# Patient Record
Sex: Male | Born: 1940 | Race: White | Hispanic: No | Smoking: Never smoker
Health system: Southern US, Community
[De-identification: ages and names within clinical notes are randomized; demographics above are authoritative.]

## PROBLEM LIST (undated history)

## (undated) DIAGNOSIS — N200 Calculus of kidney: Secondary | ICD-10-CM

## (undated) DIAGNOSIS — C4490 Unspecified malignant neoplasm of skin, unspecified: Secondary | ICD-10-CM

## (undated) DIAGNOSIS — G4730 Sleep apnea, unspecified: Secondary | ICD-10-CM

## (undated) DIAGNOSIS — I10 Essential (primary) hypertension: Secondary | ICD-10-CM

## (undated) DIAGNOSIS — Z7901 Long term (current) use of anticoagulants: Secondary | ICD-10-CM

## (undated) DIAGNOSIS — N529 Male erectile dysfunction, unspecified: Secondary | ICD-10-CM

## (undated) DIAGNOSIS — I272 Pulmonary hypertension, unspecified: Secondary | ICD-10-CM

## (undated) DIAGNOSIS — C801 Malignant (primary) neoplasm, unspecified: Secondary | ICD-10-CM

## (undated) DIAGNOSIS — D509 Iron deficiency anemia, unspecified: Secondary | ICD-10-CM

## (undated) DIAGNOSIS — N184 Chronic kidney disease, stage 4 (severe): Secondary | ICD-10-CM

## (undated) DIAGNOSIS — M4810 Ankylosing hyperostosis [Forestier], site unspecified: Secondary | ICD-10-CM

## (undated) DIAGNOSIS — E785 Hyperlipidemia, unspecified: Secondary | ICD-10-CM

## (undated) DIAGNOSIS — M961 Postlaminectomy syndrome, not elsewhere classified: Secondary | ICD-10-CM

## (undated) DIAGNOSIS — K219 Gastro-esophageal reflux disease without esophagitis: Secondary | ICD-10-CM

## (undated) DIAGNOSIS — M48062 Spinal stenosis, lumbar region with neurogenic claudication: Secondary | ICD-10-CM

## (undated) DIAGNOSIS — I2510 Atherosclerotic heart disease of native coronary artery without angina pectoris: Secondary | ICD-10-CM

## (undated) DIAGNOSIS — R2681 Unsteadiness on feet: Secondary | ICD-10-CM

## (undated) DIAGNOSIS — E119 Type 2 diabetes mellitus without complications: Secondary | ICD-10-CM

## (undated) DIAGNOSIS — M1990 Unspecified osteoarthritis, unspecified site: Secondary | ICD-10-CM

## (undated) DIAGNOSIS — G4733 Obstructive sleep apnea (adult) (pediatric): Secondary | ICD-10-CM

## (undated) DIAGNOSIS — G629 Polyneuropathy, unspecified: Secondary | ICD-10-CM

## (undated) DIAGNOSIS — I219 Acute myocardial infarction, unspecified: Secondary | ICD-10-CM

## (undated) DIAGNOSIS — I4891 Unspecified atrial fibrillation: Secondary | ICD-10-CM

## (undated) DIAGNOSIS — N400 Enlarged prostate without lower urinary tract symptoms: Secondary | ICD-10-CM

## (undated) DIAGNOSIS — M4800 Spinal stenosis, site unspecified: Secondary | ICD-10-CM

## (undated) HISTORY — PX: BACK SURGERY: SHX140

## (undated) HISTORY — PX: CORONARY ANGIOPLASTY WITH STENT PLACEMENT: SHX49

---

## 1983-03-12 DIAGNOSIS — I219 Acute myocardial infarction, unspecified: Secondary | ICD-10-CM

## 1983-03-12 DIAGNOSIS — I82401 Acute embolism and thrombosis of unspecified deep veins of right lower extremity: Secondary | ICD-10-CM

## 1983-03-12 HISTORY — DX: Acute embolism and thrombosis of unspecified deep veins of right lower extremity: I82.401

## 1983-03-12 HISTORY — DX: Acute myocardial infarction, unspecified: I21.9

## 1983-12-31 HISTORY — PX: CARDIAC CATHETERIZATION: SHX172

## 1987-03-12 HISTORY — PX: ANKLE SURGERY: SHX546

## 1988-05-08 HISTORY — PX: CARDIAC CATHETERIZATION: SHX172

## 2000-09-14 ENCOUNTER — Ambulatory Visit (HOSPITAL_COMMUNITY): Admission: RE | Admit: 2000-09-14 | Discharge: 2000-09-14 | Payer: Self-pay | Admitting: Neurosurgery

## 2000-09-14 ENCOUNTER — Encounter: Payer: Self-pay | Admitting: Neurosurgery

## 2000-09-29 ENCOUNTER — Encounter: Payer: Self-pay | Admitting: Neurosurgery

## 2000-09-29 ENCOUNTER — Encounter: Admission: RE | Admit: 2000-09-29 | Discharge: 2000-09-29 | Payer: Self-pay | Admitting: Neurosurgery

## 2000-10-07 ENCOUNTER — Encounter: Payer: Self-pay | Admitting: Neurosurgery

## 2000-10-08 ENCOUNTER — Inpatient Hospital Stay (HOSPITAL_COMMUNITY): Admission: RE | Admit: 2000-10-08 | Discharge: 2000-10-12 | Payer: Self-pay | Admitting: Neurosurgery

## 2000-10-08 ENCOUNTER — Encounter: Payer: Self-pay | Admitting: Neurosurgery

## 2001-03-11 HISTORY — PX: CORONARY ANGIOPLASTY WITH STENT PLACEMENT: SHX49

## 2002-02-17 HISTORY — PX: CORONARY ANGIOPLASTY WITH STENT PLACEMENT: SHX49

## 2002-08-17 HISTORY — PX: CARDIAC CATHETERIZATION: SHX172

## 2003-09-02 HISTORY — PX: CARDIAC CATHETERIZATION: SHX172

## 2004-04-18 ENCOUNTER — Ambulatory Visit: Payer: Self-pay | Admitting: Orthopedic Surgery

## 2004-04-25 ENCOUNTER — Ambulatory Visit: Payer: Self-pay | Admitting: Orthopedic Surgery

## 2004-06-25 ENCOUNTER — Ambulatory Visit: Payer: Self-pay | Admitting: Orthopedic Surgery

## 2013-08-16 ENCOUNTER — Encounter: Payer: Self-pay | Admitting: Neurology

## 2013-09-08 ENCOUNTER — Encounter: Payer: Self-pay | Admitting: Neurology

## 2013-10-09 ENCOUNTER — Encounter: Payer: Self-pay | Admitting: Neurology

## 2013-11-09 ENCOUNTER — Encounter: Payer: Self-pay | Admitting: Neurology

## 2013-11-23 HISTORY — PX: CARDIAC CATHETERIZATION: SHX172

## 2013-11-29 DIAGNOSIS — Z951 Presence of aortocoronary bypass graft: Secondary | ICD-10-CM

## 2013-11-29 HISTORY — DX: Presence of aortocoronary bypass graft: Z95.1

## 2013-11-29 HISTORY — PX: OTHER SURGICAL HISTORY: SHX169

## 2013-12-09 ENCOUNTER — Encounter: Payer: Self-pay | Admitting: Neurology

## 2017-11-05 ENCOUNTER — Emergency Department: Payer: Medicare HMO

## 2017-11-05 ENCOUNTER — Emergency Department
Admission: EM | Admit: 2017-11-05 | Discharge: 2017-11-06 | Disposition: A | Discharge status: Home / Self Care | Payer: Medicare HMO | Attending: Emergency Medicine | Admitting: Emergency Medicine

## 2017-11-05 ENCOUNTER — Other Ambulatory Visit: Payer: Self-pay

## 2017-11-05 DIAGNOSIS — Y939 Activity, unspecified: Secondary | ICD-10-CM | POA: Diagnosis not present

## 2017-11-05 DIAGNOSIS — Y9289 Other specified places as the place of occurrence of the external cause: Secondary | ICD-10-CM | POA: Diagnosis not present

## 2017-11-05 DIAGNOSIS — Y999 Unspecified external cause status: Secondary | ICD-10-CM | POA: Insufficient documentation

## 2017-11-05 DIAGNOSIS — E119 Type 2 diabetes mellitus without complications: Secondary | ICD-10-CM | POA: Diagnosis not present

## 2017-11-05 DIAGNOSIS — I10 Essential (primary) hypertension: Secondary | ICD-10-CM | POA: Insufficient documentation

## 2017-11-05 DIAGNOSIS — M546 Pain in thoracic spine: Secondary | ICD-10-CM | POA: Insufficient documentation

## 2017-11-05 DIAGNOSIS — I252 Old myocardial infarction: Secondary | ICD-10-CM | POA: Diagnosis not present

## 2017-11-05 DIAGNOSIS — Z7901 Long term (current) use of anticoagulants: Secondary | ICD-10-CM | POA: Diagnosis not present

## 2017-11-05 DIAGNOSIS — W19XXXA Unspecified fall, initial encounter: Secondary | ICD-10-CM

## 2017-11-05 DIAGNOSIS — R0789 Other chest pain: Secondary | ICD-10-CM | POA: Diagnosis not present

## 2017-11-05 DIAGNOSIS — S0990XA Unspecified injury of head, initial encounter: Secondary | ICD-10-CM | POA: Insufficient documentation

## 2017-11-05 DIAGNOSIS — W1839XA Other fall on same level, initial encounter: Secondary | ICD-10-CM | POA: Insufficient documentation

## 2017-11-05 HISTORY — DX: Type 2 diabetes mellitus without complications: E11.9

## 2017-11-05 HISTORY — DX: Unspecified atrial fibrillation: I48.91

## 2017-11-05 HISTORY — DX: Essential (primary) hypertension: I10

## 2017-11-05 HISTORY — DX: Unspecified osteoarthritis, unspecified site: M19.90

## 2017-11-05 HISTORY — DX: Gastro-esophageal reflux disease without esophagitis: K21.9

## 2017-11-05 HISTORY — DX: Polyneuropathy, unspecified: G62.9

## 2017-11-05 HISTORY — DX: Acute myocardial infarction, unspecified: I21.9

## 2017-11-05 LAB — BASIC METABOLIC PANEL
ANION GAP: 8 (ref 5–15)
BUN: 22 mg/dL (ref 8–23)
CALCIUM: 9.2 mg/dL (ref 8.9–10.3)
CO2: 27 mmol/L (ref 22–32)
Chloride: 104 mmol/L (ref 98–111)
Creatinine, Ser: 1.29 mg/dL — ABNORMAL HIGH (ref 0.61–1.24)
GFR, EST NON AFRICAN AMERICAN: 53 mL/min — AB (ref >60)
GLUCOSE: 128 mg/dL — AB (ref 70–99)
POTASSIUM: 3.8 mmol/L (ref 3.5–5.1)
Sodium: 139 mmol/L (ref 135–145)

## 2017-11-05 LAB — CBC WITH DIFFERENTIAL/PLATELET
BASOS ABS: 0 10*3/uL (ref 0–0.1)
BASOS PCT: 0 %
EOS PCT: 0 %
Eosinophils Absolute: 0 10*3/uL (ref 0–0.7)
HCT: 34.5 % — ABNORMAL LOW (ref 40.0–52.0)
Hemoglobin: 11.8 g/dL — ABNORMAL LOW (ref 13.0–18.0)
Lymphocytes Relative: 12 %
Lymphs Abs: 1.3 10*3/uL (ref 1.0–3.6)
MCH: 27.6 pg (ref 26.0–34.0)
MCHC: 34.3 g/dL (ref 32.0–36.0)
MCV: 80.5 fL (ref 80.0–100.0)
Monocytes Absolute: 0.9 10*3/uL (ref 0.2–1.0)
Monocytes Relative: 8 %
NEUTROS ABS: 8.6 10*3/uL — AB (ref 1.4–6.5)
Neutrophils Relative %: 80 %
PLATELETS: 214 10*3/uL (ref 150–440)
RBC: 4.29 MIL/uL — AB (ref 4.40–5.90)
RDW: 24.7 % — AB (ref 11.5–14.5)
WBC: 10.9 10*3/uL — AB (ref 3.8–10.6)

## 2017-11-05 LAB — LACTIC ACID, PLASMA: LACTIC ACID, VENOUS: 1.6 mmol/L (ref 0.5–1.9)

## 2017-11-05 MED ORDER — AZITHROMYCIN 500 MG PO TABS: 500.0000 mg | ORAL_TABLET | Freq: Once | ORAL | Status: DC

## 2017-11-05 MED ORDER — AZITHROMYCIN 250 MG PO TABS: ORAL_TABLET | ORAL | 0 refills | Status: AC

## 2017-11-05 MED ORDER — AMOXICILLIN-POT CLAVULANATE 875-125 MG PO TABS: 1.0000 | ORAL_TABLET | Freq: Two times a day (BID) | ORAL | 0 refills | Status: AC

## 2017-11-05 MED ORDER — AZITHROMYCIN 500 MG PO TABS
500.0000 mg | ORAL_TABLET | Freq: Once | ORAL | Status: AC
  Administered 2017-11-05: 500 mg via ORAL
  Filled 2017-11-05: qty 1

## 2017-11-05 MED ORDER — AMOXICILLIN-POT CLAVULANATE 875-125 MG PO TABS
1.0000 | ORAL_TABLET | Freq: Once | ORAL | Status: AC
  Administered 2017-11-05: 1 via ORAL
  Filled 2017-11-05: qty 1

## 2017-11-05 MED ORDER — SODIUM CHLORIDE 0.9 % IV SOLN: 1.0000 g | Freq: Once | INTRAVENOUS | Status: DC

## 2017-11-05 NOTE — Discharge Instructions (Signed)
The chest x-ray shows what appears to be a pneumonia in the left chest.  You are not coughing not running a high fever but just in case I will give you some antibiotics.  I will give you some Augmentin and some Zithromax.  Both of these can be taken together.  The Augmentin is 1 twice a day with food to try to avoid diarrhea and the Zithromax is once a day.  I will give you the first dose of each tonight.  Please return for any fever shortness of breath bad coughing pain or feeling sicker.  Please follow-up with your doctor in about a week or 2.

## 2017-11-05 NOTE — ED Triage Notes (Signed)
Pt stated that he fell while he was outside in his work shop and stated that he hit his head but did not pass out. Pt c/o upper back pain. Pt has rods in his back from a previous injury.

## 2017-11-05 NOTE — ED Provider Notes (Signed)
[facility] Emergency Department Provider Note  ____________________________________________   I have reviewed the triage vital signs and the nursing notes.   HISTORY  Chief Complaint Fall   History limited by: Not Limited   HPI Derek Andersen is a 77 y.o. male who presents to the emergency department today because of concern for fall and head injury. Patient states he was in his workshop when he lost his balance and fell backwards. Hit his head but denies LOC. Did fall on her left side. Complaining of pain in his upper back. Has history of frequent back surgeries. Denies any chest pain, palpitations. Denies any nausea or vomiting. Is on eliquis.    Per medical record review patient has a history of atrial fibrillation.  Past Medical History:  Diagnosis Date  . Arthritis   . Atrial fibrillation ([place])   . Diabetes mellitus without complication ([place])   . GERD (gastroesophageal reflux disease)   . Hypertension   . MI (myocardial infarction) ([place])   . Neuropathy     There are no active problems to display for this patient.    Prior to Admission medications   Not on File    Allergies Latex and Oxycodone  No family history on file.  Social History Social History   Tobacco Use  . Smoking status: Not on file  Substance Use Topics  . Alcohol use: Not on file  . Drug use: Not on file    Review of Systems Constitutional: No fever/chills Eyes: No visual changes. ENT: No sore throat. Cardiovascular: Denies chest pain. Respiratory: Denies shortness of breath. Gastrointestinal: No abdominal pain.  No nausea, no vomiting.  No diarrhea.   Genitourinary: Negative for dysuria. Musculoskeletal: Positive for upper back pain. Skin: Negative for rash. Neurological: Negative for headaches, focal weakness or numbness.  ____________________________________________   PHYSICAL EXAM:  VITAL SIGNS: ED Triage Vitals  Enc Vitals Group     BP  11/05/17 1906 (!) 174/70     Pulse Rate 11/05/17 1906 65     Resp 11/05/17 1906 18     Temp 11/05/17 1906 100.1 F (37.8 C)     Temp src --      SpO2 11/05/17 1906 95 %     Weight 11/05/17 1920 232 lb (105.2 kg)     Height 11/05/17 1920 $RemoveBefor'5\' 6"'[id number]$  (1.676 m)     Head Circumference --      Peak Flow --      Pain Score 11/05/17 1907 4   Constitutional: Alert and oriented.  Eyes: Conjunctivae are normal.  ENT      Head: Normocephalic and atraumatic.      Nose: No congestion/rhinnorhea.      Mouth/Throat: Mucous membranes are moist.      Neck: No stridor. No midline tenderness. Hematological/Lymphatic/Immunilogical: No cervical lymphadenopathy. Cardiovascular: Normal rate, regular rhythm.  No murmurs, rubs, or gallops.  Respiratory: Normal respiratory effort without tachypnea nor retractions. Breath sounds are clear and equal bilaterally. No wheezes/rales/rhonchi. Gastrointestinal: Soft and non tender. No rebound. No guarding.  Genitourinary: Deferred Musculoskeletal: Normal range of motion in all extremities. No lower extremity edema. Neurologic:  Normal speech and language. No gross focal neurologic deficits are appreciated.  Skin:  Small abrasion to left mid back. Psychiatric: Mood and affect are normal. Speech and behavior are normal. Patient exhibits appropriate insight and judgment.  ____________________________________________    LABS (pertinent positives/negatives)  Pending  ____________________________________________   EKG  I, Nance Pear, attending physician, personally viewed and  interpreted this EKG  EKG Time: 1919 Rate: 63 Rhythm: sinus rhythm Axis: left axis deviation Intervals: qtc 421 QRS: q waves v1, v2 ST changes: no st elevation Impression: abnormal ekg   ____________________________________________    RADIOLOGY  CT head/cervical spine No acute intracranial or cervical spine findings  Thoracic spine x-ray No acute injury  Left rib  fracture No rib fracture, concern for possible consolidation in left lower lobe. ____________________________________________   PROCEDURES  Procedures  ____________________________________________   INITIAL IMPRESSION / ASSESSMENT AND PLAN / ED COURSE  Pertinent labs & imaging results that were available during my care of the patient were reviewed by me and considered in my medical decision making (see chart for details).   Patient presented to the emergency department today after a fall and hitting his head.  Patient did have a small abrasion over the left  mid back.  Had imaging done of the head cervical spine thoracic spine and left rib series.  None of this showed an acute traumatic injury however there was concern for possible pneumonia to the left lower lobe.  Given this finding will add on blood work.  Blood work pending at time of signout.   ____________________________________________   FINAL CLINICAL IMPRESSION(S) / ED DIAGNOSES  Fall  Note: This dictation was prepared with Sales executive. Any transcriptional errors that result from this process are unintentional     Nance Pear, MD 11/05/17 2104

## 2017-11-05 NOTE — ED Notes (Signed)
Provider in with patient.

## 2017-11-05 NOTE — ED Provider Notes (Signed)
Patient has a low-grade fever and a little bit of a white count.  There is a dense infiltrate at the left base.  Uncertain if this is worse than what he has had documented previously.  He also has a fusion of course.  Because of the low-grade fever I will give him some antibiotics.  He will follow-up with his doctor and return if worse.   Nena Polio, MD 11/05/17 [phone]

## 2018-11-12 ENCOUNTER — Other Ambulatory Visit: Payer: Self-pay

## 2018-11-12 DIAGNOSIS — Z20822 Contact with and (suspected) exposure to covid-19: Secondary | ICD-10-CM

## 2018-11-13 LAB — NOVEL CORONAVIRUS, NAA: SARS-CoV-2, NAA: DETECTED — AB

## 2018-11-20 DIAGNOSIS — Z8616 Personal history of COVID-19: Secondary | ICD-10-CM

## 2018-11-20 HISTORY — DX: Personal history of COVID-19: Z86.16

## 2019-06-02 ENCOUNTER — Other Ambulatory Visit: Payer: Self-pay | Admitting: Nephrology

## 2019-06-02 DIAGNOSIS — R809 Proteinuria, unspecified: Secondary | ICD-10-CM

## 2019-06-02 DIAGNOSIS — N184 Chronic kidney disease, stage 4 (severe): Secondary | ICD-10-CM

## 2019-06-07 ENCOUNTER — Ambulatory Visit
Admission: RE | Admit: 2019-06-07 | Discharge: 2019-06-07 | Disposition: A | Discharge status: Home / Self Care | Payer: Medicare HMO | Source: Ambulatory Visit | Attending: Nephrology | Admitting: Nephrology

## 2019-06-07 ENCOUNTER — Other Ambulatory Visit: Payer: Self-pay

## 2019-06-07 DIAGNOSIS — R809 Proteinuria, unspecified: Secondary | ICD-10-CM | POA: Diagnosis present

## 2019-06-07 DIAGNOSIS — N184 Chronic kidney disease, stage 4 (severe): Secondary | ICD-10-CM | POA: Diagnosis not present

## 2019-06-28 ENCOUNTER — Encounter: Payer: Self-pay | Admitting: *Deleted

## 2019-06-28 ENCOUNTER — Other Ambulatory Visit: Payer: Self-pay

## 2019-06-28 ENCOUNTER — Inpatient Hospital Stay
Admission: EM | Admit: 2019-06-28 | Discharge: 2019-07-07 | DRG: 371 | Disposition: A | Discharge status: Home Health | Payer: Medicare HMO | Attending: Hospitalist | Admitting: Hospitalist

## 2019-06-28 DIAGNOSIS — Z981 Arthrodesis status: Secondary | ICD-10-CM

## 2019-06-28 DIAGNOSIS — R509 Fever, unspecified: Secondary | ICD-10-CM

## 2019-06-28 DIAGNOSIS — A09 Infectious gastroenteritis and colitis, unspecified: Secondary | ICD-10-CM | POA: Diagnosis present

## 2019-06-28 DIAGNOSIS — E1169 Type 2 diabetes mellitus with other specified complication: Secondary | ICD-10-CM

## 2019-06-28 DIAGNOSIS — Z833 Family history of diabetes mellitus: Secondary | ICD-10-CM

## 2019-06-28 DIAGNOSIS — I252 Old myocardial infarction: Secondary | ICD-10-CM

## 2019-06-28 DIAGNOSIS — A020 Salmonella enteritis: Principal | ICD-10-CM

## 2019-06-28 DIAGNOSIS — R195 Other fecal abnormalities: Secondary | ICD-10-CM | POA: Diagnosis present

## 2019-06-28 DIAGNOSIS — Z7982 Long term (current) use of aspirin: Secondary | ICD-10-CM

## 2019-06-28 DIAGNOSIS — Z823 Family history of stroke: Secondary | ICD-10-CM

## 2019-06-28 DIAGNOSIS — E860 Dehydration: Secondary | ICD-10-CM | POA: Diagnosis present

## 2019-06-28 DIAGNOSIS — K922 Gastrointestinal hemorrhage, unspecified: Secondary | ICD-10-CM

## 2019-06-28 DIAGNOSIS — Z79899 Other long term (current) drug therapy: Secondary | ICD-10-CM

## 2019-06-28 DIAGNOSIS — D649 Anemia, unspecified: Secondary | ICD-10-CM

## 2019-06-28 DIAGNOSIS — D696 Thrombocytopenia, unspecified: Secondary | ICD-10-CM

## 2019-06-28 DIAGNOSIS — Z8249 Family history of ischemic heart disease and other diseases of the circulatory system: Secondary | ICD-10-CM

## 2019-06-28 DIAGNOSIS — I2510 Atherosclerotic heart disease of native coronary artery without angina pectoris: Secondary | ICD-10-CM

## 2019-06-28 DIAGNOSIS — K219 Gastro-esophageal reflux disease without esophagitis: Secondary | ICD-10-CM | POA: Diagnosis present

## 2019-06-28 DIAGNOSIS — Z8616 Personal history of COVID-19: Secondary | ICD-10-CM

## 2019-06-28 DIAGNOSIS — J9601 Acute respiratory failure with hypoxia: Secondary | ICD-10-CM

## 2019-06-28 DIAGNOSIS — Z951 Presence of aortocoronary bypass graft: Secondary | ICD-10-CM

## 2019-06-28 DIAGNOSIS — R0902 Hypoxemia: Secondary | ICD-10-CM

## 2019-06-28 DIAGNOSIS — Z800 Family history of malignant neoplasm of digestive organs: Secondary | ICD-10-CM

## 2019-06-28 DIAGNOSIS — Z7901 Long term (current) use of anticoagulants: Secondary | ICD-10-CM

## 2019-06-28 DIAGNOSIS — A0472 Enterocolitis due to Clostridium difficile, not specified as recurrent: Secondary | ICD-10-CM

## 2019-06-28 DIAGNOSIS — E1122 Type 2 diabetes mellitus with diabetic chronic kidney disease: Secondary | ICD-10-CM | POA: Diagnosis present

## 2019-06-28 DIAGNOSIS — N184 Chronic kidney disease, stage 4 (severe): Secondary | ICD-10-CM

## 2019-06-28 DIAGNOSIS — I482 Chronic atrial fibrillation, unspecified: Secondary | ICD-10-CM

## 2019-06-28 DIAGNOSIS — K529 Noninfective gastroenteritis and colitis, unspecified: Secondary | ICD-10-CM

## 2019-06-28 DIAGNOSIS — N179 Acute kidney failure, unspecified: Secondary | ICD-10-CM

## 2019-06-28 DIAGNOSIS — E785 Hyperlipidemia, unspecified: Secondary | ICD-10-CM | POA: Diagnosis present

## 2019-06-28 DIAGNOSIS — N400 Enlarged prostate without lower urinary tract symptoms: Secondary | ICD-10-CM | POA: Diagnosis present

## 2019-06-28 DIAGNOSIS — G4733 Obstructive sleep apnea (adult) (pediatric): Secondary | ICD-10-CM | POA: Diagnosis present

## 2019-06-28 DIAGNOSIS — I129 Hypertensive chronic kidney disease with stage 1 through stage 4 chronic kidney disease, or unspecified chronic kidney disease: Secondary | ICD-10-CM | POA: Diagnosis present

## 2019-06-28 DIAGNOSIS — D631 Anemia in chronic kidney disease: Secondary | ICD-10-CM | POA: Diagnosis present

## 2019-06-28 DIAGNOSIS — Z841 Family history of disorders of kidney and ureter: Secondary | ICD-10-CM

## 2019-06-28 DIAGNOSIS — Z794 Long term (current) use of insulin: Secondary | ICD-10-CM

## 2019-06-28 DIAGNOSIS — D62 Acute posthemorrhagic anemia: Secondary | ICD-10-CM

## 2019-06-28 LAB — URINALYSIS, COMPLETE (UACMP) WITH MICROSCOPIC
Bacteria, UA: NONE SEEN
Bilirubin Urine: NEGATIVE
Glucose, UA: 50 mg/dL — AB
Ketones, ur: NEGATIVE mg/dL
Nitrite: NEGATIVE
Protein, ur: 100 mg/dL — AB
Specific Gravity, Urine: 1.017 (ref 1.005–1.030)
pH: 5 (ref 5.0–8.0)

## 2019-06-28 LAB — CBC
HCT: 19.8 % — ABNORMAL LOW (ref 39.0–52.0)
Hemoglobin: 6.1 g/dL — ABNORMAL LOW (ref 13.0–17.0)
MCH: 30.5 pg (ref 26.0–34.0)
MCHC: 30.8 g/dL (ref 30.0–36.0)
MCV: 99 fL (ref 80.0–100.0)
Platelets: 129 10*3/uL — ABNORMAL LOW (ref 150–400)
RBC: 2 MIL/uL — ABNORMAL LOW (ref 4.22–5.81)
RDW: 13.7 % (ref 11.5–15.5)
WBC: 7.9 10*3/uL (ref 4.0–10.5)
nRBC: 0 % (ref 0.0–0.2)

## 2019-06-28 LAB — LIPASE, BLOOD: Lipase: 24 U/L (ref 11–51)

## 2019-06-28 LAB — COMPREHENSIVE METABOLIC PANEL
ALT: 13 U/L (ref 0–44)
AST: 18 U/L (ref 15–41)
Albumin: 3.8 g/dL (ref 3.5–5.0)
Alkaline Phosphatase: 49 U/L (ref 38–126)
Anion gap: 9 (ref 5–15)
BUN: 50 mg/dL — ABNORMAL HIGH (ref 8–23)
CO2: 25 mmol/L (ref 22–32)
Calcium: 9.3 mg/dL (ref 8.9–10.3)
Chloride: 105 mmol/L (ref 98–111)
Creatinine, Ser: 3.19 mg/dL — ABNORMAL HIGH (ref 0.61–1.24)
GFR calc Af Amer: 21 mL/min — ABNORMAL LOW (ref >60)
GFR calc non Af Amer: 18 mL/min — ABNORMAL LOW (ref >60)
Glucose, Bld: 93 mg/dL (ref 70–99)
Potassium: 4.6 mmol/L (ref 3.5–5.1)
Sodium: 139 mmol/L (ref 135–145)
Total Bilirubin: 1 mg/dL (ref 0.3–1.2)
Total Protein: 7.7 g/dL (ref 6.5–8.1)

## 2019-06-28 LAB — GLUCOSE, CAPILLARY: Glucose-Capillary: 83 mg/dL (ref 70–99)

## 2019-06-28 MED ORDER — SODIUM CHLORIDE 0.9% FLUSH
3.0000 mL | Freq: Once | INTRAVENOUS | Status: AC
  Administered 2019-06-29: 3 mL via INTRAVENOUS

## 2019-06-28 MED ORDER — LACTATED RINGERS IV BOLUS
500.0000 mL | Freq: Once | INTRAVENOUS | Status: AC
  Administered 2019-06-29: 500 mL via INTRAVENOUS

## 2019-06-28 MED ORDER — ACETAMINOPHEN 500 MG PO TABS
1000.0000 mg | ORAL_TABLET | Freq: Once | ORAL | Status: AC
  Administered 2019-06-29: 1000 mg via ORAL
  Filled 2019-06-28: qty 2

## 2019-06-28 MED ORDER — SODIUM CHLORIDE 0.9 % IV SOLN: 10.0000 mL/h | Freq: Once | INTRAVENOUS | Status: DC

## 2019-06-28 NOTE — ED Provider Notes (Signed)
[facility] Emergency Department Provider Note  ____________________________________________  Time seen: Approximately 11:33 PM  I have reviewed the triage vital signs and the nursing notes.   HISTORY  Chief Complaint Diarrhea   HPI Derek Andersen is a 79 y.o. male with a history of A. fib on Eliquis, diabetes, hypertension, CAD, CKD, anemia of chronic disease who presents for evaluation of generalized weakness, vomiting, and diarrhea. Patient reports that he woke up this morning at 4AM feeling very weak and sick. Had 2 episodes of NBNB emesis and 4 episodes of watery yellow diarrhea during the day. No abdominal pain, no cough, CP, SOB, or abdominal pain. Patient reports that his PCP recently cut his Eliquis in half due to slowly worsening anemia. No prior history of GIB, no history of cirrhosis. Last Eliquis was this morning.    Past Medical History:  Diagnosis Date  . Arthritis   . Atrial fibrillation ([place])   . Diabetes mellitus without complication ([place])   . GERD (gastroesophageal reflux disease)   . Hypertension   . MI (myocardial infarction) ([place])   . Neuropathy     Allergies Latex and Oxycodone  FH Heart disease Daughter Marlowe Kays   Heart disease Mother      Social History Social History   Tobacco Use  . Smoking status: Never Smoker  . Smokeless tobacco: Never Used  Substance Use Topics  . Alcohol use: Not Currently  . Drug use: Not Currently    Review of Systems  Constitutional: Negative for fever. + generalized weakness Eyes: Negative for visual changes. ENT: Negative for sore throat. Neck: No neck pain  Cardiovascular: Negative for chest pain. Respiratory: Negative for shortness of breath. Gastrointestinal: Negative for abdominal pain. + vomiting and diarrhea. Genitourinary: Negative for dysuria. Musculoskeletal: Negative for back pain. Skin: Negative for rash. Neurological: Negative for headaches, weakness or  numbness. Psych: No SI or HI  ____________________________________________   PHYSICAL EXAM:  VITAL SIGNS: ED Triage Vitals  Enc Vitals Group     BP 06/28/19 1958 (!) 116/58     Pulse Rate 06/28/19 1958 78     Resp 06/28/19 1958 18     Temp 06/28/19 1958 99.2 F (37.3 C)     Temp Source 06/28/19 1958 Oral     SpO2 06/28/19 1958 96 %     Weight 06/28/19 1959 229 lb (103.9 kg)     Height 06/28/19 1959 $RemoveBefor'5\' 6"'[id number]$  (1.676 m)     Head Circumference --      Peak Flow --      Pain Score 06/28/19 1958 0     Pain Loc --      Pain Edu? --      Excl. in [place]? --     Constitutional: Alert and oriented, looks unwell but no obvious distress.  HEENT:      Head: Normocephalic and atraumatic.         Eyes: Conjunctivae are normal. Sclera is non-icteric.       Mouth/Throat: Mucous membranes are moist.       Neck: Supple with no signs of meningismus. Cardiovascular: Regular rate and rhythm. No murmurs, gallops, or rubs. 2+ symmetrical distal pulses are present in all extremities. No JVD. Respiratory: Mild increased work of breathing, tachypneic, initially satting normal on room air but now 88%, placed on 2 L, lungs are clear to auscultation.   Gastrointestinal: Soft, non tender, and non distended with positive bowel sounds. No rebound or guarding. Genitourinary: Yellow stool guaiac  positive Musculoskeletal:  No edema, cyanosis, or erythema of extremities. Neurologic: Normal speech and language. Face is symmetric. Moving all extremities. No gross focal neurologic deficits are appreciated. Skin: Skin is warm, dry and intact. No rash noted. Psychiatric: Mood and affect are normal. Speech and behavior are normal.  ____________________________________________   LABS (all labs ordered are listed, but only abnormal results are displayed)  Labs Reviewed  GASTROINTESTINAL PANEL BY PCR, STOOL (REPLACES STOOL CULTURE) - Abnormal; Notable for the following components:      Result Value   Salmonella  species DETECTED (*)    All other components within normal limits  C DIFFICILE QUICK SCREEN W PCR REFLEX - Abnormal; Notable for the following components:   C Diff antigen POSITIVE (*)    All other components within normal limits  CLOSTRIDIUM DIFFICILE BY PCR, REFLEXED - Abnormal; Notable for the following components:   Toxigenic C. Difficile by PCR POSITIVE (*)    All other components within normal limits  COMPREHENSIVE METABOLIC PANEL - Abnormal; Notable for the following components:   BUN 50 (*)    Creatinine, Ser 3.19 (*)    GFR calc non Af Amer 18 (*)    GFR calc Af Amer 21 (*)    All other components within normal limits  CBC - Abnormal; Notable for the following components:   RBC 2.00 (*)    Hemoglobin 6.1 (*)    HCT 19.8 (*)    Platelets 129 (*)    All other components within normal limits  URINALYSIS, COMPLETE (UACMP) WITH MICROSCOPIC - Abnormal; Notable for the following components:   Color, Urine YELLOW (*)    APPearance HAZY (*)    Glucose, UA 50 (*)    Hgb urine dipstick MODERATE (*)    Protein, ur 100 (*)    Leukocytes,Ua TRACE (*)    All other components within normal limits  LACTIC ACID, PLASMA - Abnormal; Notable for the following components:   Lactic Acid, Venous 0.4 (*)    All other components within normal limits  RESPIRATORY PANEL BY RT PCR (FLU A&B, COVID)  GLUCOSE, CAPILLARY  LIPASE, BLOOD  PROTIME-INR  APTT  HEMOGLOBIN A1C  PREPARE RBC (CROSSMATCH)  TYPE AND SCREEN  TYPE AND SCREEN  ABO/RH  TYPE AND SCREEN   ____________________________________________  EKG  none  ____________________________________________  RADIOLOGY  none  ____________________________________________   PROCEDURES  Procedure(s) performed:yes .1-3 Lead EKG Interpretation Performed by: Rudene Re, MD Authorized by: Rudene Re, MD     Interpretation: normal     ECG rate:  80   ECG rate assessment: normal     Rhythm: sinus rhythm     Ectopy:  none     Conduction: normal     Critical Care performed: yes  CRITICAL CARE Performed by: Rudene Re  ?  Total critical care time: 40 min  Critical care time was exclusive of separately billable procedures and treating other patients.  Critical care was necessary to treat or prevent imminent or life-threatening deterioration.  Critical care was time spent personally by me on the following activities: development of treatment plan with patient and/or surrogate as well as nursing, discussions with consultants, evaluation of patient's response to treatment, examination of patient, obtaining history from patient or surrogate, ordering and performing treatments and interventions, ordering and review of laboratory studies, ordering and review of radiographic studies, pulse oximetry and re-evaluation of patient's condition.  ____________________________________________   INITIAL IMPRESSION / ASSESSMENT AND PLAN / ED COURSE   79  y.o. male with a history of A. fib on Eliquis, diabetes, hypertension, CAD, CKD, anemia of chronic disease who presents for evaluation of generalized weakness, vomiting, and diarrhea.  Patient looks unwell, tachypneic and with new O2 requirement of 2L [place]. Hemodynamically stable with no tachycardia.  Low-grade temp of 100F.  Stool is yellow but guaiac positive.  Abdomen is soft and nontender.  Differential diagnoses including colitis versus gastroenteritis versus C. difficile versus Covid versus flu.  Labs showing acute on chronic anemia with hemoglobin of 6.1 (baseline of 11.8), also new mild thrombocytopenia with platelets of 129. Normal lactic and coags.  No leukocytosis.  Acute on chronic kidney injury with creatinine of 3.2 (baseline of 2.2).  Stool cultures, Covid, C. difficile, flu are pending.  CXR concerning for retrocardiac opacity which possibly could be pneumonia versus atelectasis.  C. difficile positive.  Stool culture positive for Salmonella.  Will  start patient on vancomycin and Rocephin. Will transfuse 2 units.  Patient has been consented and risks and benefits of transfusion have been discussed.  Old records have been reviewed.  Discussed patient with Dr. Damita Dunnings who has accepted him to her service.  Patient on telemetry for close monitoring of cardiovascular status.      _____________________________________________ Please note:  Patient was evaluated in Emergency Department today for the symptoms described in the history of present illness. Patient was evaluated in the context of the global COVID-19 pandemic, which necessitated consideration that the patient might be at risk for infection with the SARS-CoV-2 virus that causes COVID-19. Institutional protocols and algorithms that pertain to the evaluation of patients at risk for COVID-19 are in a state of rapid change based on information released by regulatory bodies including the CDC and federal and state organizations. These policies and algorithms were followed during the patient's care in the ED.  Some ED evaluations and interventions may be delayed as a result of limited staffing during the pandemic.   [place] Controlled Substance Database was reviewed by me. 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 difficile colitis  Salmonella gastroenteritis      NEW MEDICATIONS STARTED DURING THIS VISIT:  ED Discharge Orders    None       Note:  This document was prepared using Dragon voice recognition software and may include unintentional dictation errors.    Rudene Re, MD 06/29/19 [phone]

## 2019-06-28 NOTE — ED Triage Notes (Addendum)
Pt brought in via ems from home.  Pt reports diarrhea x 3 today.  States I feel like crap, feel weak.  Iv fluids infusing.   Pt alert.  Speech clear.  fsbs 83 in triage.

## 2019-06-29 ENCOUNTER — Encounter: Payer: Self-pay | Admitting: Internal Medicine

## 2019-06-29 ENCOUNTER — Observation Stay: Payer: Medicare HMO

## 2019-06-29 DIAGNOSIS — E1122 Type 2 diabetes mellitus with diabetic chronic kidney disease: Secondary | ICD-10-CM | POA: Diagnosis present

## 2019-06-29 DIAGNOSIS — Z981 Arthrodesis status: Secondary | ICD-10-CM

## 2019-06-29 DIAGNOSIS — D696 Thrombocytopenia, unspecified: Secondary | ICD-10-CM | POA: Diagnosis present

## 2019-06-29 DIAGNOSIS — Z8616 Personal history of COVID-19: Secondary | ICD-10-CM | POA: Diagnosis not present

## 2019-06-29 DIAGNOSIS — Z794 Long term (current) use of insulin: Secondary | ICD-10-CM | POA: Diagnosis not present

## 2019-06-29 DIAGNOSIS — J9601 Acute respiratory failure with hypoxia: Secondary | ICD-10-CM | POA: Diagnosis present

## 2019-06-29 DIAGNOSIS — R509 Fever, unspecified: Secondary | ICD-10-CM

## 2019-06-29 DIAGNOSIS — Z7901 Long term (current) use of anticoagulants: Secondary | ICD-10-CM

## 2019-06-29 DIAGNOSIS — N400 Enlarged prostate without lower urinary tract symptoms: Secondary | ICD-10-CM | POA: Diagnosis present

## 2019-06-29 DIAGNOSIS — I2510 Atherosclerotic heart disease of native coronary artery without angina pectoris: Secondary | ICD-10-CM | POA: Diagnosis present

## 2019-06-29 DIAGNOSIS — Z951 Presence of aortocoronary bypass graft: Secondary | ICD-10-CM

## 2019-06-29 DIAGNOSIS — N184 Chronic kidney disease, stage 4 (severe): Secondary | ICD-10-CM | POA: Diagnosis present

## 2019-06-29 DIAGNOSIS — A0472 Enterocolitis due to Clostridium difficile, not specified as recurrent: Secondary | ICD-10-CM | POA: Diagnosis present

## 2019-06-29 DIAGNOSIS — N179 Acute kidney failure, unspecified: Secondary | ICD-10-CM

## 2019-06-29 DIAGNOSIS — D649 Anemia, unspecified: Secondary | ICD-10-CM | POA: Diagnosis not present

## 2019-06-29 DIAGNOSIS — I482 Chronic atrial fibrillation, unspecified: Secondary | ICD-10-CM

## 2019-06-29 DIAGNOSIS — D631 Anemia in chronic kidney disease: Secondary | ICD-10-CM | POA: Diagnosis present

## 2019-06-29 DIAGNOSIS — I252 Old myocardial infarction: Secondary | ICD-10-CM | POA: Diagnosis not present

## 2019-06-29 DIAGNOSIS — A020 Salmonella enteritis: Secondary | ICD-10-CM

## 2019-06-29 DIAGNOSIS — K922 Gastrointestinal hemorrhage, unspecified: Secondary | ICD-10-CM

## 2019-06-29 DIAGNOSIS — N189 Chronic kidney disease, unspecified: Secondary | ICD-10-CM

## 2019-06-29 DIAGNOSIS — E1169 Type 2 diabetes mellitus with other specified complication: Secondary | ICD-10-CM

## 2019-06-29 DIAGNOSIS — K529 Noninfective gastroenteritis and colitis, unspecified: Secondary | ICD-10-CM

## 2019-06-29 DIAGNOSIS — Z79899 Other long term (current) drug therapy: Secondary | ICD-10-CM | POA: Diagnosis not present

## 2019-06-29 DIAGNOSIS — I129 Hypertensive chronic kidney disease with stage 1 through stage 4 chronic kidney disease, or unspecified chronic kidney disease: Secondary | ICD-10-CM | POA: Diagnosis present

## 2019-06-29 DIAGNOSIS — G4733 Obstructive sleep apnea (adult) (pediatric): Secondary | ICD-10-CM | POA: Diagnosis present

## 2019-06-29 DIAGNOSIS — I4891 Unspecified atrial fibrillation: Secondary | ICD-10-CM | POA: Diagnosis not present

## 2019-06-29 DIAGNOSIS — Z7982 Long term (current) use of aspirin: Secondary | ICD-10-CM | POA: Diagnosis not present

## 2019-06-29 DIAGNOSIS — A09 Infectious gastroenteritis and colitis, unspecified: Secondary | ICD-10-CM | POA: Diagnosis present

## 2019-06-29 DIAGNOSIS — R195 Other fecal abnormalities: Secondary | ICD-10-CM | POA: Diagnosis present

## 2019-06-29 DIAGNOSIS — E785 Hyperlipidemia, unspecified: Secondary | ICD-10-CM | POA: Diagnosis present

## 2019-06-29 LAB — GASTROINTESTINAL PANEL BY PCR, STOOL (REPLACES STOOL CULTURE)

## 2019-06-29 LAB — CBC
HCT: 42.8 % (ref 39.0–52.0)
Hemoglobin: 13.7 g/dL (ref 13.0–17.0)
MCH: 30 pg (ref 26.0–34.0)
MCHC: 32 g/dL (ref 30.0–36.0)
MCV: 93.7 fL (ref 80.0–100.0)
Platelets: 213 10*3/uL (ref 150–400)
RBC: 4.57 MIL/uL (ref 4.22–5.81)
RDW: 14.7 % (ref 11.5–15.5)
WBC: 7.6 10*3/uL (ref 4.0–10.5)
nRBC: 0 % (ref 0.0–0.2)

## 2019-06-29 LAB — GLUCOSE, CAPILLARY
Glucose-Capillary: 120 mg/dL — ABNORMAL HIGH (ref 70–99)
Glucose-Capillary: 156 mg/dL — ABNORMAL HIGH (ref 70–99)
Glucose-Capillary: 177 mg/dL — ABNORMAL HIGH (ref 70–99)
Glucose-Capillary: 183 mg/dL — ABNORMAL HIGH (ref 70–99)
Glucose-Capillary: 184 mg/dL — ABNORMAL HIGH (ref 70–99)
Glucose-Capillary: 188 mg/dL — ABNORMAL HIGH (ref 70–99)
Glucose-Capillary: 192 mg/dL — ABNORMAL HIGH (ref 70–99)

## 2019-06-29 LAB — BASIC METABOLIC PANEL
Anion gap: 12 (ref 5–15)
BUN: 55 mg/dL — ABNORMAL HIGH (ref 8–23)
CO2: 22 mmol/L (ref 22–32)
Calcium: 8.7 mg/dL — ABNORMAL LOW (ref 8.9–10.3)
Chloride: 104 mmol/L (ref 98–111)
Creatinine, Ser: 3.44 mg/dL — ABNORMAL HIGH (ref 0.61–1.24)
GFR calc Af Amer: 19 mL/min — ABNORMAL LOW (ref >60)
GFR calc non Af Amer: 16 mL/min — ABNORMAL LOW (ref >60)
Glucose, Bld: 174 mg/dL — ABNORMAL HIGH (ref 70–99)
Potassium: 3.9 mmol/L (ref 3.5–5.1)
Sodium: 138 mmol/L (ref 135–145)

## 2019-06-29 LAB — PREPARE RBC (CROSSMATCH)

## 2019-06-29 LAB — APTT: aPTT: 24 seconds (ref 24–36)

## 2019-06-29 LAB — RESPIRATORY PANEL BY RT PCR (FLU A&B, COVID)
Influenza A by PCR: NEGATIVE
Influenza B by PCR: NEGATIVE
SARS Coronavirus 2 by RT PCR: NEGATIVE

## 2019-06-29 LAB — C DIFFICILE QUICK SCREEN W PCR REFLEX
C Diff antigen: POSITIVE — AB
C Diff toxin: NEGATIVE

## 2019-06-29 LAB — LACTIC ACID, PLASMA: Lactic Acid, Venous: 0.4 mmol/L — ABNORMAL LOW (ref 0.5–1.9)

## 2019-06-29 LAB — ABO/RH: ABO/RH(D): A POS

## 2019-06-29 LAB — PROTIME-INR
INR: 1.1 (ref 0.8–1.2)
Prothrombin Time: 14.2 seconds (ref 11.4–15.2)

## 2019-06-29 LAB — CLOSTRIDIUM DIFFICILE BY PCR, REFLEXED: Toxigenic C. Difficile by PCR: POSITIVE — AB

## 2019-06-29 MED ORDER — PANTOPRAZOLE SODIUM 40 MG IV SOLR
40.0000 mg | Freq: Every day | INTRAVENOUS | Status: DC
  Administered 2019-06-29 – 2019-07-06 (×9): 40 mg via INTRAVENOUS
  Filled 2019-06-29 (×9): qty 40

## 2019-06-29 MED ORDER — SODIUM CHLORIDE 0.9 % IV SOLN: Freq: Once | INTRAVENOUS | Status: AC

## 2019-06-29 MED ORDER — SODIUM CHLORIDE 0.9 % IV SOLN
2.0000 g | Freq: Once | INTRAVENOUS | Status: AC
  Administered 2019-06-29: 2 g via INTRAVENOUS
  Filled 2019-06-29: qty 20

## 2019-06-29 MED ORDER — ACETAMINOPHEN 325 MG PO TABS
650.0000 mg | ORAL_TABLET | Freq: Four times a day (QID) | ORAL | Status: DC | PRN
  Administered 2019-06-29 – 2019-06-30 (×4): 650 mg via ORAL
  Filled 2019-06-29 (×4): qty 2

## 2019-06-29 MED ORDER — SODIUM CHLORIDE 0.9 % IV SOLN: 1.0000 g | INTRAVENOUS | Status: DC

## 2019-06-29 MED ORDER — ONDANSETRON HCL 4 MG PO TABS
4.0000 mg | ORAL_TABLET | Freq: Four times a day (QID) | ORAL | Status: DC | PRN
  Administered 2019-06-29: 18:00:00 4 mg via ORAL
  Filled 2019-06-29: qty 1

## 2019-06-29 MED ORDER — INSULIN ASPART 100 UNIT/ML ~~LOC~~ SOLN
0.0000 [IU] | SUBCUTANEOUS | Status: DC
  Administered 2019-06-29 (×4): 3 [IU] via SUBCUTANEOUS
  Administered 2019-06-30: 20:00:00 8 [IU] via SUBCUTANEOUS
  Administered 2019-06-30: 2 [IU] via SUBCUTANEOUS
  Administered 2019-06-30: 5 [IU] via SUBCUTANEOUS
  Administered 2019-06-30: 2 [IU] via SUBCUTANEOUS
  Administered 2019-06-30: 3 [IU] via SUBCUTANEOUS
  Administered 2019-06-30: 05:00:00 2 [IU] via SUBCUTANEOUS
  Administered 2019-07-01 (×2): 3 [IU] via SUBCUTANEOUS
  Administered 2019-07-01: 8 [IU] via SUBCUTANEOUS
  Administered 2019-07-01: 22:00:00 5 [IU] via SUBCUTANEOUS
  Administered 2019-07-01: 01:00:00 3 [IU] via SUBCUTANEOUS
  Administered 2019-07-01: 13:00:00 5 [IU] via SUBCUTANEOUS
  Administered 2019-07-02: 11 [IU] via SUBCUTANEOUS
  Administered 2019-07-02: 5 [IU] via SUBCUTANEOUS
  Administered 2019-07-02 (×3): 3 [IU] via SUBCUTANEOUS
  Administered 2019-07-02 – 2019-07-03 (×2): 5 [IU] via SUBCUTANEOUS
  Administered 2019-07-03: 11 [IU] via SUBCUTANEOUS
  Administered 2019-07-03: 13:00:00 5 [IU] via SUBCUTANEOUS
  Administered 2019-07-03: 11 [IU] via SUBCUTANEOUS
  Administered 2019-07-03: 05:00:00 3 [IU] via SUBCUTANEOUS
  Administered 2019-07-04: 13:00:00 8 [IU] via SUBCUTANEOUS
  Administered 2019-07-04: 04:00:00 2 [IU] via SUBCUTANEOUS
  Administered 2019-07-04: 5 [IU] via SUBCUTANEOUS
  Administered 2019-07-04: 19:00:00 11 [IU] via SUBCUTANEOUS
  Administered 2019-07-04: 22:00:00 15 [IU] via SUBCUTANEOUS
  Administered 2019-07-04: 09:00:00 3 [IU] via SUBCUTANEOUS
  Administered 2019-07-04: 5 [IU] via SUBCUTANEOUS
  Administered 2019-07-05: 04:00:00 2 [IU] via SUBCUTANEOUS
  Administered 2019-07-05: 5 [IU] via SUBCUTANEOUS
  Administered 2019-07-05: 2 [IU] via SUBCUTANEOUS
  Filled 2019-06-29 (×36): qty 1

## 2019-06-29 MED ORDER — ONDANSETRON HCL 4 MG/2ML IJ SOLN
4.0000 mg | Freq: Four times a day (QID) | INTRAMUSCULAR | Status: DC | PRN
  Administered 2019-06-29 – 2019-06-30 (×2): 4 mg via INTRAVENOUS
  Filled 2019-06-29: qty 2

## 2019-06-29 MED ORDER — VANCOMYCIN 50 MG/ML ORAL SOLUTION
125.0000 mg | Freq: Four times a day (QID) | ORAL | Status: DC
  Administered 2019-06-29 – 2019-06-30 (×5): 125 mg via ORAL
  Filled 2019-06-29 (×10): qty 2.5

## 2019-06-29 MED ORDER — SODIUM CHLORIDE 0.9 % IV SOLN
2.0000 g | INTRAVENOUS | Status: DC
  Administered 2019-06-30 – 2019-07-06 (×7): 2 g via INTRAVENOUS
  Filled 2019-06-29 (×4): qty 2
  Filled 2019-06-29: qty 20
  Filled 2019-06-29 (×3): qty 2

## 2019-06-29 MED ORDER — SODIUM CHLORIDE 0.9 % IV SOLN: INTRAVENOUS | Status: DC

## 2019-06-29 MED ORDER — ACETAMINOPHEN 650 MG RE SUPP: 650.0000 mg | Freq: Four times a day (QID) | RECTAL | Status: DC | PRN

## 2019-06-29 NOTE — Consult Note (Addendum)
NAME: Derek Andersen  DOB: [birth year]  MRN: [id number]  Date/Time: 06/29/2019 5:18 PM  REQUESTING PROVIDER Dr. Kurtis Bushman Subjective:  REASON FOR CONSULT: Diarrhea ? Derek Andersen is a 79 y.o. male with history of A. fib, coronary artery disease, CKD, diabetes mellitus, anemia, BPH, Is brought by EMS with 2 days of diarrhea and vomiting and generalized weakness.  He did not have any fever chills or abdominal pain. As per patient this started on Monday- He had tenesmus and diarrhea. He had 2 episodes of vomitiing He had eaten salad  And sandwiches bought from outside- no one else is sick at home- His wife and son. He did not have any BRBPR In the ED his vitals were BP of 161/62, heart rate of 95, respiratory rate of 23, temperature of 99.2 which went up to 103.  Labs revealed a WBC of 7.9, hemoglobin of 6.1, platelet of 129.  Creatinine of 3.19 and BUN of 50.  LFTs were normal.  INR was 1.1.  Blood cultures were sent.  Gastrointestinally panel revealed Salmonella species.  And C. difficile antigen was positive but toxin was negative.as star  He was started on IV ceftriaxone and p.o. Vanco. I am asked to see him for the same Past Medical History:  Diagnosis Date  . Arthritis   . Atrial fibrillation ([place])   . Diabetes mellitus without complication ([place])   . GERD (gastroesophageal reflux disease)   . Hypertension   . MI (myocardial infarction) ([place])   . Neuropathy    BPH COVID 74- sept 2020  [place] CABG Spinal surgery 11 - fusion of vertebrae Complex cervical spine surgery 07/21/18 Fusion of lumbar spine 05/08/17   Social History   Socioeconomic History  . Marital status: Married    Spouse name: Not on file  . Number of children: Not on file  . Years of education: Not on file  . Highest education level: Not on file  Occupational History  . Not on file  Tobacco Use  . Smoking status: Never Smoker  . Smokeless tobacco: Never Used  Substance and Sexual Activity  . Alcohol use: Not  Currently  . Drug use: Not Currently  . Sexual activity: Not on file  Other Topics Concern  . Not on file  Social History Narrative  . Not on file   Social Determinants of Health   Financial Resource Strain:   . Difficulty of Paying Living Expenses:   Food Insecurity:   . Worried About Charity fundraiser in the Last Year:   . Arboriculturist in the Last Year:   Transportation Needs:   . Film/video editor (Medical):   Marland Kitchen Lack of Transportation (Non-Medical):   Physical Activity:   . Days of Exercise per Week:   . Minutes of Exercise per Session:   Stress:   . Feeling of Stress :   Social Connections:   . Frequency of Communication with Friends and Family:   . Frequency of Social Gatherings with Friends and Family:   . Attends Religious Services:   . Active Member of Clubs or Organizations:   . Attends Archivist Meetings:   Marland Kitchen Marital Status:   Intimate Partner Violence:   . Fear of Current or Ex-Partner:   . Emotionally Abused:   Marland Kitchen Physically Abused:   . Sexually Abused:     Family History CAD brother DM paternal aunt Stroke mother HTN mother Kidney disease mother Colon cancer mother Allergies  Allergen Reactions  .  Latex   . Oxycodone      Abtx:  Anti-infectives (From admission, onward)   Start     Dose/Rate Route Frequency Ordered Stop   06/30/19 0000  cefTRIAXone (ROCEPHIN) 2 g in sodium chloride 0.9 % 100 mL IVPB    Note to Pharmacy: To start 24 hrs after dose ordered from the ER   2 g 200 mL/hr over 30 Minutes Intravenous Every 24 hours 06/29/19 0237     06/29/19 1000  vancomycin (VANCOCIN) 50 mg/mL oral solution 125 mg     125 mg Oral 4 times daily 06/29/19 0210 07/09/19 0959   06/29/19 0230  cefTRIAXone (ROCEPHIN) 1 g in sodium chloride 0.9 % 100 mL IVPB  Status:  Discontinued    Note to Pharmacy: To start 24 hrs after dose ordered from the ER   1 g 200 mL/hr over 30 Minutes Intravenous Every 24 hours 06/29/19 0227 06/29/19 0237    06/29/19 0215  cefTRIAXone (ROCEPHIN) 2 g in sodium chloride 0.9 % 100 mL IVPB     2 g 200 mL/hr over 30 Minutes Intravenous  Once 06/29/19 0210 06/29/19 0305      REVIEW OF SYSTEMS:  Const: negative fever, negative chills, negative weight loss Eyes: negative diplopia or visual changes, negative eye pain ENT: negative coryza, negative sore throat Resp: negative cough, hemoptysis, dyspnea Cards: negative for chest pain, palpitations, lower extremity edema GU: negative for frequency, dysuria and hematuria GI: Negative for abdominal pain, positive for diarrhea,  Skin: negative for rash and pruritus Heme: negative for easy bruising and gum/nose bleeding MS: weakness Neurolo:negative for headaches, dizziness, vertigo, memory problems  Psych: negative for feelings of anxiety, depression  Endocrine: , diabetes Allergy/Immunology- as above Objective:  VITALS:  BP (!) 126/53   Pulse 73   Temp 98.6 F (37 C) (Oral)   Resp (!) 26   Ht $R'5\' 6"'oJ$  (1.676 m)   Wt 103.9 kg   SpO2 97%   BMI 36.96 kg/m  PHYSICAL EXAM:  General: Alert, cooperative, no distress, appears stated age. obese Head: Normocephalic, without obvious abnormality, atraumatic. Eyes: Conjunctivae clear, anicteric sclerae. Pupils are equal ENT Nares normal. No drainage or sinus tenderness. Lips, mucosa, and tongue normal. No Thrush Neck: Supple, symmetrical, no adenopathy, thyroid: non tender no carotid bruit and no JVD. Back: No CVA tenderness. Lungs: Clear to auscultation bilaterally. No Wheezing or Rhonchi. No rales. Heart: irregular Abdomen: Soft,  distended. Para umbilical hernia Extremities: atraumatic, no cyanosis. No edema. No clubbing Skin: No rashes or lesions. Or bruising Lymph: Cervical, supraclavicular normal. Neurologic: Grossly non-focal Pertinent Labs Lab Results CBC    Component Value Date/Time   WBC 7.6 06/29/2019 1239   RBC 4.57 06/29/2019 1239   HGB 13.7 06/29/2019 1239   HCT 42.8 06/29/2019  1239   PLT 213 06/29/2019 1239   MCV 93.7 06/29/2019 1239   MCH 30.0 06/29/2019 1239   MCHC 32.0 06/29/2019 1239   RDW 14.7 06/29/2019 1239   LYMPHSABS 1.3 11/05/2017 2102   MONOABS 0.9 11/05/2017 2102   EOSABS 0.0 11/05/2017 2102   BASOSABS 0.0 11/05/2017 2102    CMP Latest Ref Rng & Units 06/29/2019 06/28/2019 11/05/2017  Glucose 70 - 99 mg/dL 174(H) 93 128(H)  BUN 8 - 23 mg/dL 55(H) 50(H) 22  Creatinine 0.61 - 1.24 mg/dL 3.44(H) 3.19(H) 1.29(H)  Sodium 135 - 145 mmol/L 138 139 139  Potassium 3.5 - 5.1 mmol/L 3.9 4.6 3.8  Chloride 98 - 111 mmol/L 104 105 104  CO2  22 - 32 mmol/L $RemoveB'22 25 27  'jkCKnlnE$ Calcium 8.9 - 10.3 mg/dL 8.7(L) 9.3 9.2  Total Protein 6.5 - 8.1 g/dL - 7.7 -  Total Bilirubin 0.3 - 1.2 mg/dL - 1.0 -  Alkaline Phos 38 - 126 U/L - 49 -  AST 15 - 41 U/L - 18 -  ALT 0 - 44 U/L - 13 -      Microbiology: Recent Results (from the past 240 hour(s))  Gastrointestinal Panel by PCR , Stool     Status: Abnormal   Collection Time: 06/29/19 12:08 AM   Specimen: Stool  Result Value Ref Range Status   Campylobacter species NOT DETECTED NOT DETECTED Final   Plesimonas shigelloides NOT DETECTED NOT DETECTED Final   Salmonella species DETECTED (A) NOT DETECTED Final    Comment: RESULT CALLED TO, READ BACK BY AND VERIFIED WITH: Adelene Idler RN [id number] 06/29/19 HNM    Yersinia enterocolitica NOT DETECTED NOT DETECTED Final   Vibrio species NOT DETECTED NOT DETECTED Final   Vibrio cholerae NOT DETECTED NOT DETECTED Final   Enteroaggregative E coli (EAEC) NOT DETECTED NOT DETECTED Final   Enteropathogenic E coli (EPEC) NOT DETECTED NOT DETECTED Final   Enterotoxigenic E coli (ETEC) NOT DETECTED NOT DETECTED Final   Shiga like toxin producing E coli (STEC) NOT DETECTED NOT DETECTED Final   Shigella/Enteroinvasive E coli (EIEC) NOT DETECTED NOT DETECTED Final   Cryptosporidium NOT DETECTED NOT DETECTED Final   Cyclospora cayetanensis NOT DETECTED NOT DETECTED Final   Entamoeba histolytica  NOT DETECTED NOT DETECTED Final   Giardia lamblia NOT DETECTED NOT DETECTED Final   Adenovirus F40/41 NOT DETECTED NOT DETECTED Final   Astrovirus NOT DETECTED NOT DETECTED Final   Norovirus GI/GII NOT DETECTED NOT DETECTED Final   Rotavirus A NOT DETECTED NOT DETECTED Final   Sapovirus (I, II, IV, and V) NOT DETECTED NOT DETECTED Final    Comment: Performed at [facility], [address] [place], [place] [address]  C Difficile Quick Screen w PCR reflex     Status: Abnormal   Collection Time: 06/29/19 12:08 AM   Specimen: Stool  Result Value Ref Range Status   C Diff antigen POSITIVE (A) NEGATIVE Final   C Diff toxin NEGATIVE NEGATIVE Final   C Diff interpretation Results are indeterminate. See PCR results.  Final    Comment: Performed at [facility], [address] [place], [place] [address]  Respiratory Panel by RT PCR (Flu A&B, Covid) - Nasopharyngeal Swab     Status: None   Collection Time: 06/29/19 12:08 AM   Specimen: Nasopharyngeal Swab  Result Value Ref Range Status   SARS Coronavirus 2 by RT PCR NEGATIVE NEGATIVE Final    Comment: (NOTE) SARS-CoV-2 target nucleic acids are NOT DETECTED. The SARS-CoV-2 RNA is generally detectable in upper respiratoy specimens during the acute phase of infection. The lowest concentration of SARS-CoV-2 viral copies this assay can detect is 131 copies/mL. A negative result does not preclude SARS-Cov-2 infection and should not be used as the sole basis for treatment or other patient management decisions. A negative result may occur with  improper specimen collection/handling, submission of specimen other than nasopharyngeal swab, presence of viral mutation(s) within the areas targeted by this assay, and inadequate number of viral copies (<131 copies/mL). A negative result must be combined with clinical observations, patient history, and epidemiological information. The expected result is Negative. Fact Sheet for  Patients:  PinkCheek.be Fact Sheet for Healthcare Providers:  GravelBags.it This test  is not yet ap proved or cleared by the [place] and  has been authorized for detection and/or diagnosis of SARS-CoV-2 by FDA under an Emergency Use Authorization (EUA). This EUA will remain  in effect (meaning this test can be used) for the duration of the COVID-19 declaration under Section 564(b)(1) of the Act, 21 U.S.C. section 360bbb-3(b)(1), unless the authorization is terminated or revoked sooner.    Influenza A by PCR NEGATIVE NEGATIVE Final   Influenza B by PCR NEGATIVE NEGATIVE Final    Comment: (NOTE) The Xpert Xpress SARS-CoV-2/FLU/RSV assay is intended as an aid in  the diagnosis of influenza from Nasopharyngeal swab specimens and  should not be used as a sole basis for treatment. Nasal washings and  aspirates are unacceptable for Xpert Xpress SARS-CoV-2/FLU/RSV  testing. Fact Sheet for Patients: PinkCheek.be Fact Sheet for Healthcare Providers: GravelBags.it This test is not yet approved or cleared by the [place] FDA and  has been authorized for detection and/or diagnosis of SARS-CoV-2 by  FDA under an Emergency Use Authorization (EUA). This EUA will remain  in effect (meaning this test can be used) for the duration of the  Covid-19 declaration under Section 564(b)(1) of the Act, 21  U.S.C. section 360bbb-3(b)(1), unless the authorization is  terminated or revoked. Performed at [facility], [address], [place], [place] [address]   C. Diff by PCR, Reflexed     Status: Abnormal   Collection Time: 06/29/19 12:08 AM  Result Value Ref Range Status   Toxigenic C. Difficile by PCR POSITIVE (A) NEGATIVE Final    Comment: Positive for toxigenic C. difficile with little to no toxin production. Only treat if clinical presentation suggests symptomatic  illness. Performed at [facility], [address] [place], [place] [address]     IMAGING RESULTS: I have personally reviewed the films ? Impression/Recommendation ? Febrile illness with gastroenteritis Stool PCR has salmonella Salmonella dysentry?? This very likely is non typhoidal species Not sure what put his at risk ? Food source  He ate sandwich ( fish) and meat from outside Need to make sure he does not have any immune compromising condition like malignancy/ Watch for bacteremia and endovascular infection R/o MM Continue IV ceftriaxone  Cdiff antigen positive , tox negative and PCR positive- no recent antibiotic or steroid use Could be colonization Okay to treat with vanco   Anemia- Hb 6 ( was 11) FOB positive Watch for any frank GI bleed  Afib- rate controlled  60  Hardware spine  CAD s/p CABG ? ?DM- on sliding scale  AKI on CKD ___________________________________________________ Discussed with patient, requesting provider Note:  This document was prepared using Dragon voice recognition software and may include unintentional dictation errors.

## 2019-06-29 NOTE — ED Notes (Signed)
Blood is not ready from blood bank yet

## 2019-06-29 NOTE — ED Notes (Signed)
Pt brief changed.

## 2019-06-29 NOTE — H&P (Addendum)
History and Physical    Derek Andersen LTJ:[id number] DOB: Dec 18, 1940 DOA: 06/28/2019  PCP: Derinda Late, MD   Patient coming from: Home I have personally briefly reviewed patient's old medical records in [place]  Chief Complaint: Diarrhea and vomiting x1 days, generalized weakness  HPI: Derek Andersen is a 79 y.o. male with medical history significant for A. fib on Eliquis, CAD, CKD 4, DM, anemia of CKD, BPH and OSA who presents to the emergency room with a 3-day history of diarrhea and vomiting associated with weakness.  He denied fever, chills or abdominal pain.  Denies chest pain or shortness of breath.  His vomit is yellow gastric contents, nonbloody nonbilious and no coffee grounds.  Diarrhea is watery and yellow, no blood in the stool no black stool. No affected contacts.  ED Course: On arrival in the emergency room BP 161/62, pulse 95, respirations 23 with O2 sat 97% on room air.  Blood work significant for hemoglobin of 6.1 down from 11.4 a month prior, creatinine 3.19 up from baseline 2.29 a month ago.  Urinalysis unremarkable with trace leukocytes.  Stool for occult blood was positive.  Patient typed and crossed and started on blood transfusion in the emergency room.  Hospitalist consulted for admission.  Addendum: Following admission stool tested positive for both C. difficile and Salmonella species. Chest x-ray showed abnormality of uncertain significance recommending follow-up 2 view chest x-ray  Review of Systems: As per HPI otherwise 10 point review of systems negative.    Past Medical History:  Diagnosis Date  . Arthritis   . Atrial fibrillation ([place])   . Diabetes mellitus without complication ([place])   . GERD (gastroesophageal reflux disease)   . Hypertension   . MI (myocardial infarction) ([place])   . Neuropathy     The histories are not reviewed yet. Please review them in the "History" navigator section and refresh this [place].   reports that he has  never smoked. He has never used smokeless tobacco. He reports previous alcohol use. He reports previous drug use.  Allergies  Allergen Reactions  . Latex   . Oxycodone     No family history on file.   Prior to Admission medications   Not on File    Physical Exam: Vitals:   06/28/19 1958 06/28/19 1959 06/28/19 2300 06/28/19 2300  BP: (!) 116/58  (!) 161/62   Pulse: 78  95   Resp: 18  (!) 23   Temp: 99.2 F (37.3 C)   100 F (37.8 C)  TempSrc: Oral   Oral  SpO2: 96%  97%   Weight:  103.9 kg    Height:  $Remove'5\' 6"'FwKEdIy$  (1.676 m)       Vitals:   06/28/19 1958 06/28/19 1959 06/28/19 2300 06/28/19 2300  BP: (!) 116/58  (!) 161/62   Pulse: 78  95   Resp: 18  (!) 23   Temp: 99.2 F (37.3 C)   100 F (37.8 C)  TempSrc: Oral   Oral  SpO2: 96%  97%   Weight:  103.9 kg    Height:  $Remove'5\' 6"'FKzyMvJ$  (1.676 m)      Constitutional: Lethargic but able to talk and answer questions, oriented x3, not in any acute distress. Eyes: PERLA, EOMI, irises appear normal, anicteric sclera,  ENMT: external ears and nose appear normal, normal hearing             Lips appears normal, oropharynx mucosa, tongue, posterior pharynx appear normal  Neck: neck  appears normal, no masses, normal ROM, no thyromegaly, no JVD  CVS: S1-S2 clear, no murmur rubs or gallops,  , no carotid bruits, pedal pulses palpable, No LE edema Respiratory:  clear to auscultation bilaterally, no wheezing, rales or rhonchi. Mild tachypnea. No accessory muscle use.  Abdomen: soft nontender,  Mildly distended, normal bowel sounds, no hepatosplenomegaly, no hernias Musculoskeletal: : no cyanosis, clubbing , no contractures or atrophy Neuro: Cranial nerves II-XII intact, sensation, reflexes normal, strength Psych: judgement and insight appear normal, stable mood and affect,  Skin: no rashes or lesions or ulcers, no induration or nodules   Labs on Admission: I have personally reviewed following labs and imaging studies  CBC: Recent Labs  Lab  06/28/19 2002  WBC 7.9  HGB 6.1*  HCT 19.8*  MCV 99.0  PLT 119*   Basic Metabolic Panel: Recent Labs  Lab 06/28/19 2002  NA 139  K 4.6  CL 105  CO2 25  GLUCOSE 93  BUN 50*  CREATININE 3.19*  CALCIUM 9.3   GFR: Estimated Creatinine Clearance: 22.2 mL/min (A) (by C-G formula based on SCr of 3.19 mg/dL (H)). Liver Function Tests: Recent Labs  Lab 06/28/19 2002  AST 18  ALT 13  ALKPHOS 49  BILITOT 1.0  PROT 7.7  ALBUMIN 3.8   Recent Labs  Lab 06/28/19 2002  LIPASE 24   No results for input(s): AMMONIA in the last 168 hours. Coagulation Profile: Recent Labs  Lab 06/28/19 2304  INR 1.1   Cardiac Enzymes: No results for input(s): CKTOTAL, CKMB, CKMBINDEX, TROPONINI in the last 168 hours. BNP (last 3 results) No results for input(s): PROBNP in the last 8760 hours. HbA1C: No results for input(s): HGBA1C in the last 72 hours. CBG: Recent Labs  Lab 06/28/19 1959  GLUCAP 83   Lipid Profile: No results for input(s): CHOL, HDL, LDLCALC, TRIG, CHOLHDL, LDLDIRECT in the last 72 hours. Thyroid Function Tests: No results for input(s): TSH, T4TOTAL, FREET4, T3FREE, THYROIDAB in the last 72 hours. Anemia Panel: No results for input(s): VITAMINB12, FOLATE, FERRITIN, TIBC, IRON, RETICCTPCT in the last 72 hours. Urine analysis:    Component Value Date/Time   COLORURINE YELLOW (A) 06/28/2019 2002   APPEARANCEUR HAZY (A) 06/28/2019 2002   LABSPEC 1.017 06/28/2019 2002   PHURINE 5.0 06/28/2019 2002   GLUCOSEU 50 (A) 06/28/2019 2002   HGBUR MODERATE (A) 06/28/2019 2002   BILIRUBINUR NEGATIVE 06/28/2019 2002   [place] NEGATIVE 06/28/2019 2002   PROTEINUR 100 (A) 06/28/2019 2002   NITRITE NEGATIVE 06/28/2019 2002   LEUKOCYTESUR TRACE (A) 06/28/2019 2002    Radiological Exams on Admission: No results found.  EKG: Independently reviewed.   Assessment/Plan Principal Problem:   Symptomatic anemia Suspect GI bleed in the setting of chronic  anticoagulation -Hemoglobin 6.1 down from 11.4.  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  SCDs for DVT prophylaxis -Keep n.p.o. except for ice chips. -IV Protonix -Serial H&H -Consider GI inpatient evaluation    Acute gastroenteritis --C. difficile and salmonella positive -3-day nausea and vomiting -IV hydration, IV antiemetics, IV Protonix --Addendum: Following admission stool tested positive for C. difficile and salmonella -Started on oral vancomycin and Rocephin    Acute renal failure superimposed on stage 4 chronic kidney disease ([place]) -IV hydration and monitor renal function -Consult nephrology if worsening  Chronic anticoagulation   Atrial fibrillation, chronic (HCC) -Patient on Eliquis for A. fib -Being held on account of bleeding    CAD (coronary artery disease) -No chest pain -We will hold antiplatelets in view of bleeding.  Continue beta-blockers and statins pending med rec  Type 2 diabetes mellitus with hyperlipidemia (H[facility]) -Insulin sliding scale coverage    DVT prophylaxis: SCDs Code Status: full code  Family Communication:  none  Disposition Plan: Back to previous home environment Consults called: GI  Status:obs    Athena Masse MD [facility]     06/29/2019, 12:13 AM

## 2019-06-29 NOTE — ED Notes (Signed)
Patient is positive for salmonella

## 2019-06-29 NOTE — ED Notes (Addendum)
Verbal order for fecal management system obtained due to patient's persistent diarrhea.

## 2019-06-29 NOTE — ED Notes (Signed)
Lab at bedside

## 2019-06-29 NOTE — Progress Notes (Addendum)
Patient arrived to unit from ER. Breathing is even and unlabored. No distress is noted. All safety measures are in place. Diastolic BP is slightly low (see documented vitals, this is not a change from ER vitals) and temp is 101. Dr. Kurtis Bushman notified. Tylenol given for fever. Patient requested zofran for nausea- given. Rectal tube in place. Ice chips given. Will continue to monitor patient closely.

## 2019-06-29 NOTE — ED Notes (Signed)
Lab contacted to collect blood cultures.

## 2019-06-29 NOTE — Consult Note (Signed)
Midge Minium, MD [facility]  [address], [address] [place], [place] [address] Phone: [phone] Fax : [phone]  Consultation  Referring Provider:     Dr. Para March Primary Care Physician:  Kandyce Rud, MD Primary Gastroenterologist: [facility]         Reason for Consultation:     Heme positive stools  Date of Admission:  06/28/2019 Date of Consultation:  06/29/2019         HPI:   Derek Andersen is a 79 y.o. male who reports that he has been having diarrhea for last few days.  The patient was found to have C. difficile positive and Salmonella on his GI panel.  The patient denies any black stools or bloody stools.  He also reports that he has been keeping up with his colonoscopies at [facility]. The patient has a history of A. fib and is on Eliquis.  He has a history of diabetes hypertension coronary artery disease chronic kidney disease and anemia of chronic disease.  The patient reports that he had vomiting prior to admission without any blood and he has had watery yellow diarrhea.  The patient denies any sick contacts and he denies being on any antibiotics recently.  He also denies any past history of any GI bleeding.  The patient has a colonoscopy that was done in 2012 with a hyperplastic polyp found at that time it was recommended that the patient have a repeat colonoscopy in 5 years.  In 2009 the patient had a colonoscopy with adenomatous polyps.  The patient now denies any abdominal pain nausea vomiting since being in the ER.  He does report having some chills.  The patient's hemoglobin 1 year ago was 11.8 and in February of this year was also found to be 11.6 with his hemoglobin yesterday of 6.1.  The patient has received a blood transfusion since being in the ER.  The patient's liver enzymes were normal and his lactic acid was not elevated.  Past Medical History:  Diagnosis Date  . Arthritis   . Atrial fibrillation (HCC)   . Diabetes mellitus  without complication (HCC)   . GERD (gastroesophageal reflux disease)   . Hypertension   . MI (myocardial infarction) (HCC)   . Neuropathy      Prior to Admission medications   Medication Sig Start Date End Date Taking? Authorizing Provider  acetaminophen (TYLENOL) 325 MG tablet Take 325 mg by mouth every 6 (six) hours as needed.   Yes [provider]  amLODipine (NORVASC) 10 MG tablet Take 10 mg by mouth daily. 03/01/19  Yes [provider]  aspirin 81 MG EC tablet Take 81 mg by mouth daily.   Yes [provider]  benazepril (LOTENSIN) 20 MG tablet Take 20 mg by mouth daily. 06/27/19  Yes [provider]  calcium elemental as carbonate (BARIATRIC TUMS ULTRA) 400 MG chewable tablet Chew 1 tablet by mouth every 2 (two) hours.    Yes [provider]  Cholecalciferol 25 MCG (1000 UT) capsule Take 1,000 Units by mouth daily. 05/30/09  Yes [provider]  Docosahexaenoic Acid 200 MG CAPS Take 200 mg by mouth in the morning and at bedtime.   Yes [provider]  ELIQUIS 2.5 MG TABS tablet Take 2.5 mg by mouth every 12 (twelve) hours. 05/06/19  Yes [provider]  escitalopram (LEXAPRO) 10 MG tablet Take 15 mg by mouth daily. 03/29/19  Yes [provider]  fenofibrate 160 MG  tablet Take 80 mg by mouth daily. 05/27/19  Yes [provider]  ferrous sulfate 325 (65 FE) MG tablet Take 325 mg by mouth daily.   Yes [provider]  finasteride (PROSCAR) 5 MG tablet Take 5 mg by mouth daily. 05/26/19  Yes [provider]  fluticasone (FLONASE) 50 MCG/ACT nasal spray Place 2 sprays into both nostrils daily. 05/23/19  Yes [provider]  furosemide (LASIX) 20 MG tablet Take 20 mg by mouth daily.  05/19/19  Yes [provider]  gabapentin (NEURONTIN) 800 MG tablet Take 400 mg by mouth 2 (two) times daily. 04/16/19  Yes [provider]  hydrocortisone 2.5 % cream Apply 1 application  topically See admin instructions. Apply to the affected scaly areas on face twice daily as needed. 05/23/19  Yes [provider]  insulin glargine (LANTUS) 100 UNIT/ML Solostar Pen Inject 50 Units into the skin at bedtime. 04/26/19  Yes [provider]  ketoconazole (NIZORAL) 2 % shampoo Apply 1 application topically as directed. 02/25/18  Yes [provider]  KLOR-CON M20 20 MEQ tablet Take 20 mEq by mouth 2 (two) times daily. 04/28/19  Yes [provider]  latanoprost (XALATAN) 0.005 % ophthalmic solution Place 1 drop into both eyes at bedtime. 04/08/19  Yes [provider]  NOVOLOG FLEXPEN 100 UNIT/ML FlexPen Inject 40-60 Units into the skin 3 (three) times daily before meals. INJECT INTO THE SKIN 50 UNITS AM,  40 UNITS LUNCH, AND 50 - 60 UNITS  WITH SUPPER 06/04/19  Yes [provider]  pantoprazole (PROTONIX) 40 MG tablet Take 40 mg by mouth daily. 04/19/19  Yes [provider]  senna-docusate (SENOKOT-S) 8.6-50 MG tablet Take 2 tablets by mouth at bedtime. 07/10/18  Yes [provider]  tamsulosin (FLOMAX) 0.4 MG CAPS capsule Take 0.4 mg by mouth daily. 04/15/19  Yes [provider]    No family history on file.   Social History   Tobacco Use  . Smoking status: Never Smoker  . Smokeless tobacco: Never Used  Substance Use Topics  . Alcohol use: Not Currently  . Drug use: Not Currently    Allergies as of 06/28/2019 - Review Complete 06/28/2019  Allergen Reaction Noted  . Latex  11/05/2017  . Oxycodone  11/05/2017    Review of Systems:    All systems reviewed and negative except where noted in HPI.   Physical Exam:  Vital signs in last 24 hours: Temp:  [99.2 F (37.3 C)-103.1 F (39.5 C)] 100.3 F (37.9 C) (04/20 0815) Pulse Rate:  [63-113] 70 (04/20 1100) Resp:  [13-39] 24 (04/20 1100) BP: (103-179)/(51-91) 114/57 (04/20 1100) SpO2:  [88 %-100 %] 99 % (04/20 1100) Weight:  [103.9 kg] 103.9 kg (04/19  1959)   General:   Pleasant, cooperative in NAD Head:  Normocephalic and atraumatic. Eyes:   No icterus.   Conjunctiva pink. PERRLA. Ears:  Normal auditory acuity. Neck:  Supple; no masses or thyroidomegaly Lungs: Respirations even and unlabored. Lungs clear to auscultation bilaterally.   No wheezes, crackles, or rhonchi.  Heart:  Regular rate and rhythm;  Without murmur, clicks, rubs or gallops Abdomen:  Soft, nondistended, nontender. Normal bowel sounds. No appreciable masses or hepatomegaly.  No rebound or guarding.  Rectal:  Not performed. Msk:  Symmetrical without gross deformities.    Extremities:  Without edema, cyanosis or clubbing. Neurologic:  Alert and oriented x3;  grossly normal neurologically. Skin:  Intact without significant lesions or rashes. Cervical Nodes:  No significant cervical adenopathy. Psych:  Alert and cooperative. Normal affect.  LAB RESULTS: Recent Labs    06/28/19 2002  WBC 7.9  HGB 6.1*  HCT 19.8*  PLT 129*   BMET Recent Labs    06/28/19 2002  NA 139  K 4.6  CL 105  CO2 25  GLUCOSE 93  BUN 50*  CREATININE 3.19*  CALCIUM 9.3   LFT Recent Labs    06/28/19 2002  PROT 7.7  ALBUMIN 3.8  AST 18  ALT 13  ALKPHOS 49  BILITOT 1.0   PT/INR Recent Labs    06/28/19 2304  LABPROT 14.2  INR 1.1    STUDIES: DG Chest Portable 1 View  Result Date: 06/29/2019 CLINICAL DATA:  Hypoxia. EXAM: PORTABLE CHEST 1 VIEW COMPARISON:  Most recent radiograph 11/05/2017 FINDINGS: Mild cardiomegaly. Unchanged mediastinal contours. Nonspecific retrocardiac opacity. No focal airspace disease in the right lung. No pulmonary edema or pneumothorax. Thoracolumbar spinal fusion hardware. Hardware in the lower cervical spine is partially included. IMPRESSION: 1. Retrocardiac opacity which is nonspecific and not well evaluated. The may simply represent soft tissue attenuation from habitus and overlapping structures, however possibility of atelectasis/airspace  disease and/or pleural effusion is raised. Consider follow-up PA and lateral views when patient is able. 2. Mild cardiomegaly. Electronically Signed   By: Keith Rake M.D.   On: 06/29/2019 01:00      Impression / Plan:   Assessment: Principal Problem:   Symptomatic anemia Active Problems:   Acute gastroenteritis: C diff and Salmonella positive    Suspect GI bleed   Acute renal failure superimposed on stage 4 chronic kidney disease (HCC)   Chronic anticoagulation   Atrial fibrillation, chronic (HCC)   CAD (coronary artery disease)   Type 2 diabetes mellitus with hyperlipidemia (HCC)   Clostridium difficile enteritis   Intestinal infection or food poisoning due to salmonella bacteria   Derek Andersen is a 79 y.o. y/o male with heme positive stools and found to be C. difficile and salmonella positive.  The patient is at a high risk for a colonoscopy at this time with a inflamed colon with an infective etiology.  I personally was able to observe his stool which was watery and yellow without any sign of clots, or black stools or any red stools.  Plan:  The patient should be treated for his infectious colitis.  The patient can have heme positive stools from his infection.  There is no black stools or bloody stools or overt signs of bleeding.  I would treat symptomatically at this time.  The patient is at a contraindication for a colonoscopy at this time due to his active infection and has no sign of any upper GI bleed warranting a upper endoscopy at this time.  Thank you for involving me in the care of this patient.      LOS: 0 days   Lucilla Lame, MD  06/29/2019, 1:04 PM Pager [phone] 7am-5pm  Check AMION for 5pm -7am coverage and on weekends   Note: This dictation was prepared with Dragon dictation along with smaller phrase technology. Any transcriptional errors that result from this process are unintentional.

## 2019-06-29 NOTE — ED Notes (Signed)
Ready bed @ 1608, patient going to room 102, spoke with RN Joellen Jersey

## 2019-06-29 NOTE — ED Notes (Signed)
Pt changed at this time by this rn and [facility].

## 2019-06-29 NOTE — ED Notes (Signed)
Pt given ice chips

## 2019-06-29 NOTE — ED Notes (Signed)
Pt's O2 sats in upper 70s, placed on 2L, back up to 96% at this time.

## 2019-06-29 NOTE — Progress Notes (Signed)
Patient ID: Derek Andersen, male   DOB: 20-May-1940, 79 y.o.   MRN: [id number] Patient was admitted this morning.  Patient seen and examined.  H&P reviewed.AZEKIEL CREMER is a 79 y.o. male with medical history significant for A. fib on Eliquis, CAD, CKD 4, DM, anemia of CKD, BPH and OSA who presents to the emergency room with a 3-day history of diarrhea and vomiting associated with weakness.  Found with Salmonella and C. difficile positive stool.  Started on antibiotics. Gen: nad Lungs cta abd soft mild distention +bs   A/P ID consulted GI consult cotninue abx.

## 2019-06-29 NOTE — ED Notes (Signed)
Flexi-seal fecal management system placed. Balloon inflated to 38cc.

## 2019-06-29 NOTE — ED Notes (Signed)
Ice packs placed in armpits to bring fever down (103.41F) per [facility] NP.  Hang 2nd unit of blood then re-evaluate need for fluids.

## 2019-06-29 NOTE — ED Notes (Signed)
Floor coverage [facility] NP messaged regarding pt being placed on 2L and temp of 103.1.

## 2019-06-30 ENCOUNTER — Encounter: Payer: Self-pay | Admitting: Internal Medicine

## 2019-06-30 DIAGNOSIS — D649 Anemia, unspecified: Secondary | ICD-10-CM | POA: Diagnosis not present

## 2019-06-30 LAB — URINE CULTURE: Culture: <10000 — AB

## 2019-06-30 LAB — HEPATIC FUNCTION PANEL
ALT: 15 U/L (ref 0–44)
AST: 20 U/L (ref 15–41)
Albumin: 2.8 g/dL — ABNORMAL LOW (ref 3.5–5.0)
Alkaline Phosphatase: 35 U/L — ABNORMAL LOW (ref 38–126)
Bilirubin, Direct: 0.2 mg/dL (ref 0.0–0.2)
Indirect Bilirubin: 0.7 mg/dL (ref 0.3–0.9)
Total Bilirubin: 0.9 mg/dL (ref 0.3–1.2)
Total Protein: 6.1 g/dL — ABNORMAL LOW (ref 6.5–8.1)

## 2019-06-30 LAB — CBC
HCT: 39 % (ref 39.0–52.0)
Hemoglobin: 12.5 g/dL — ABNORMAL LOW (ref 13.0–17.0)
MCH: 29.8 pg (ref 26.0–34.0)
MCHC: 32.1 g/dL (ref 30.0–36.0)
MCV: 92.9 fL (ref 80.0–100.0)
Platelets: 188 10*3/uL (ref 150–400)
RBC: 4.2 MIL/uL — ABNORMAL LOW (ref 4.22–5.81)
RDW: 14.6 % (ref 11.5–15.5)
WBC: 6.2 10*3/uL (ref 4.0–10.5)
nRBC: 0 % (ref 0.0–0.2)

## 2019-06-30 LAB — BASIC METABOLIC PANEL
Anion gap: 12 (ref 5–15)
BUN: 57 mg/dL — ABNORMAL HIGH (ref 8–23)
CO2: 19 mmol/L — ABNORMAL LOW (ref 22–32)
Calcium: 8.3 mg/dL — ABNORMAL LOW (ref 8.9–10.3)
Chloride: 105 mmol/L (ref 98–111)
Creatinine, Ser: 3.56 mg/dL — ABNORMAL HIGH (ref 0.61–1.24)
GFR calc Af Amer: 18 mL/min — ABNORMAL LOW (ref >60)
GFR calc non Af Amer: 16 mL/min — ABNORMAL LOW (ref >60)
Glucose, Bld: 146 mg/dL — ABNORMAL HIGH (ref 70–99)
Potassium: 3.2 mmol/L — ABNORMAL LOW (ref 3.5–5.1)
Sodium: 136 mmol/L (ref 135–145)

## 2019-06-30 LAB — BPAM RBC
Blood Product Expiration Date: 202105142359
Blood Product Expiration Date: 202105142359
ISSUE DATE / TIME: 202104200219
ISSUE DATE / TIME: 202104200733
Unit Type and Rh: 6200
Unit Type and Rh: 6200

## 2019-06-30 LAB — TYPE AND SCREEN
ABO/RH(D): A POS
Antibody Screen: NEGATIVE
Unit division: 0
Unit division: 0

## 2019-06-30 LAB — HEMOGLOBIN A1C
Hgb A1c MFr Bld: 6.1 % — ABNORMAL HIGH (ref 4.8–5.6)
Mean Plasma Glucose: 128.37 mg/dL

## 2019-06-30 LAB — GLUCOSE, CAPILLARY
Glucose-Capillary: 129 mg/dL — ABNORMAL HIGH (ref 70–99)
Glucose-Capillary: 139 mg/dL — ABNORMAL HIGH (ref 70–99)
Glucose-Capillary: 144 mg/dL — ABNORMAL HIGH (ref 70–99)
Glucose-Capillary: 222 mg/dL — ABNORMAL HIGH (ref 70–99)
Glucose-Capillary: 269 mg/dL — ABNORMAL HIGH (ref 70–99)

## 2019-06-30 LAB — HIV ANTIBODY (ROUTINE TESTING W REFLEX): HIV Screen 4th Generation wRfx: NONREACTIVE

## 2019-06-30 MED ORDER — ACETAMINOPHEN 325 MG PO TABS
650.0000 mg | ORAL_TABLET | ORAL | Status: DC | PRN
  Administered 2019-06-30 – 2019-07-06 (×13): 650 mg via ORAL
  Filled 2019-06-30 (×14): qty 2

## 2019-06-30 MED ORDER — VANCOMYCIN 50 MG/ML ORAL SOLUTION
500.0000 mg | Freq: Four times a day (QID) | ORAL | Status: DC
  Filled 2019-06-30: qty 10

## 2019-06-30 MED ORDER — ACETAMINOPHEN 650 MG RE SUPP: 650.0000 mg | Freq: Four times a day (QID) | RECTAL | Status: DC | PRN

## 2019-06-30 MED ORDER — MELATONIN 5 MG PO TABS
2.5000 mg | ORAL_TABLET | Freq: Every day | ORAL | Status: DC
  Administered 2019-07-01: 01:00:00 2.5 mg via ORAL
  Filled 2019-06-30: qty 0.5

## 2019-06-30 MED ORDER — VANCOMYCIN 50 MG/ML ORAL SOLUTION
125.0000 mg | Freq: Four times a day (QID) | ORAL | Status: DC
  Administered 2019-07-01 (×3): 125 mg via ORAL
  Filled 2019-06-30 (×5): qty 2.5

## 2019-06-30 MED ORDER — TRAZODONE HCL 50 MG PO TABS
50.0000 mg | ORAL_TABLET | Freq: Every day | ORAL | Status: DC
  Administered 2019-06-30: 50 mg via ORAL
  Filled 2019-06-30: qty 1

## 2019-06-30 NOTE — Progress Notes (Signed)
PROGRESS NOTE    Derek Andersen  WJX:[id number] DOB: [birth year] DOA: 06/28/2019 PCP: Derinda Late, MD   Brief Narrative:  HPI: Derek Andersen is a 79 y.o. male with medical history significant for A. fib on Eliquis, CAD, CKD 4, DM, anemia of CKD, BPH and OSA who presents to the emergency room with a 3-day history of diarrhea and vomiting associated with weakness.  He denied fever, chills or abdominal pain.  Denies chest pain or shortness of breath.  His vomit is yellow gastric contents, nonbloody nonbilious and no coffee grounds.  Diarrhea is watery and yellow, no blood in the stool no black stool. No affected contacts.  4/21: Patient seen and examined.  Still significantly weakened and deconditioned.  Rectal tube remains in place.  Hemoglobin stable over interval.   Assessment & Plan:   Principal Problem:   Symptomatic anemia Active Problems:   Acute gastroenteritis: C diff and Salmonella positive    Suspect GI bleed   Acute renal failure superimposed on stage 4 chronic kidney disease (HCC)   Chronic anticoagulation   Atrial fibrillation, chronic (HCC)   CAD (coronary artery disease)   Type 2 diabetes mellitus with hyperlipidemia (HCC)   Clostridium difficile enteritis   Intestinal infection or food poisoning due to salmonella bacteria   Infectious colitis  Acute Salmonella gastroenteritis C. difficile toxin positive antigen negative 3 days of nausea vomiting Unclear trigger, possibly chicken salad versus fish sandwich Infectious disease consulted, started on oral Vanco and Rocephin Plan: Continue vancomycin p.o. Continue IV Rocephin Continue rectal tube for now, DC in morning if still volume decreasing  Symptomatic anemia Possible GI bleed Stool guaiac positive Transfuse 2 units in ED GI consulted, no plans for scope, FOBT secondary to acute infectious colitis Plan: Continue to hold Eliquis IV PPI Trend H&H Treat infection as above  Acute renal failure  superimposed on stage 4 chronic kidney disease ([place]) -IV hydration and monitor renal function -Consult nephrology if worsening    Chronic anticoagulation   Atrial fibrillation, chronic (HCC) -Patient on Eliquis for A. fib -Being held on account of bleeding    CAD (coronary artery disease) -No chest pain -We will hold antiplatelets in view of bleeding.  Continue beta-blockers and statins pending med rec  Type 2 diabetes mellitus with hyperlipidemia (HCC) -Insulin sliding scale coverage   DVT prophylaxis: SCD Code Status: Full code Family Communication: None today Disposition Plan: Status is: Inpatient  Remains inpatient appropriate because:Inpatient level of care appropriate due to severity of illness   Dispo: The patient is from: Home              Anticipated d/c is to: Home              Anticipated d/c date is: 2 days              Patient currently is not medically stable to d/c.        Consultants:   GI  ID  Procedures:   none  Antimicrobials:  Rocephin  PO vancomycin    Subjective: Seen and examined Endorses no improvement in symptoms No fevers noted over interval  Objective: Vitals:   06/29/19 2340 06/30/19 0000 06/30/19 0732 06/30/19 1500  BP: (!) 82/46 (!) 106/24 (!) 144/58 (!) 153/53  Pulse: 72 72 65 62  Resp: 17 (!) $Remo'22 20 18  'mzMPf$ Temp: 98.3 F (36.8 C) 98.3 F (36.8 C) 99.7 F (37.6 C) 98.6 F (37 C)  TempSrc: Oral Oral Oral Oral  SpO2: 98% 99% 99% 100%  Weight:      Height:        Intake/Output Summary (Last 24 hours) at 06/30/2019 1640 Last data filed at 06/30/2019 0300 Gross per 24 hour  Intake 578.8 ml  Output 900 ml  Net -321.2 ml   Filed Weights   06/28/19 1959  Weight: 103.9 kg    Examination:  General exam: Appears calm and comfortable  Respiratory system: Clear to auscultation. Respiratory effort normal. Cardiovascular system: S1 & S2 heard, RRR. No JVD, murmurs, rubs, gallops or clicks. No pedal  edema. Gastrointestinal system: Hyperactive bowel sounds, rectal tube in place Central nervous system: Alert and oriented. No focal neurological deficits. Extremities: Symmetric 5 x 5 power. Skin: No rashes, lesions or ulcers Psychiatry: Judgement and insight appear normal. Mood & affect appropriate.     Data Reviewed: I have personally reviewed following labs and imaging studies  CBC: Recent Labs  Lab 06/28/19 2002 06/29/19 1239 06/30/19 0539  WBC 7.9 7.6 6.2  HGB 6.1* 13.7 12.5*  HCT 19.8* 42.8 39.0  MCV 99.0 93.7 92.9  PLT 129* 213 268   Basic Metabolic Panel: Recent Labs  Lab 06/28/19 2002 06/29/19 1239 06/30/19 0539  NA 139 138 136  K 4.6 3.9 3.2*  CL 105 104 105  CO2 25 22 19*  GLUCOSE 93 174* 146*  BUN 50* 55* 57*  CREATININE 3.19* 3.44* 3.56*  CALCIUM 9.3 8.7* 8.3*   GFR: Estimated Creatinine Clearance: 19.9 mL/min (A) (by C-G formula based on SCr of 3.56 mg/dL (H)). Liver Function Tests: Recent Labs  Lab 06/28/19 2002  AST 18  ALT 13  ALKPHOS 49  BILITOT 1.0  PROT 7.7  ALBUMIN 3.8   Recent Labs  Lab 06/28/19 2002  LIPASE 24   No results for input(s): AMMONIA in the last 168 hours. Coagulation Profile: Recent Labs  Lab 06/28/19 2304  INR 1.1   Cardiac Enzymes: No results for input(s): CKTOTAL, CKMB, CKMBINDEX, TROPONINI in the last 168 hours. BNP (last 3 results) No results for input(s): PROBNP in the last 8760 hours. HbA1C: Recent Labs    06/29/19 0510  HGBA1C 6.1*   CBG: Recent Labs  Lab 06/29/19 2338 06/30/19 0425 06/30/19 0728 06/30/19 1109 06/30/19 1628  GLUCAP 156* 129* 139* 144* 222*   Lipid Profile: No results for input(s): CHOL, HDL, LDLCALC, TRIG, CHOLHDL, LDLDIRECT in the last 72 hours. Thyroid Function Tests: No results for input(s): TSH, T4TOTAL, FREET4, T3FREE, THYROIDAB in the last 72 hours. Anemia Panel: No results for input(s): VITAMINB12, FOLATE, FERRITIN, TIBC, IRON, RETICCTPCT in the last 72  hours. Sepsis Labs: Recent Labs  Lab 06/28/19 2304  LATICACIDVEN 0.4*    Recent Results (from the past 240 hour(s))  Gastrointestinal Panel by PCR , Stool     Status: Abnormal   Collection Time: 06/29/19 12:08 AM   Specimen: Stool  Result Value Ref Range Status   Campylobacter species NOT DETECTED NOT DETECTED Final   Plesimonas shigelloides NOT DETECTED NOT DETECTED Final   Salmonella species DETECTED (A) NOT DETECTED Final    Comment: RESULT CALLED TO, READ BACK BY AND VERIFIED WITH: Adelene Idler RN 0142 06/29/19 HNM    Yersinia enterocolitica NOT DETECTED NOT DETECTED Final   Vibrio species NOT DETECTED NOT DETECTED Final   Vibrio cholerae NOT DETECTED NOT DETECTED Final   Enteroaggregative E coli (EAEC) NOT DETECTED NOT DETECTED Final   Enteropathogenic E coli (EPEC) NOT DETECTED NOT DETECTED Final   Enterotoxigenic E coli (  ETEC) NOT DETECTED NOT DETECTED Final   Shiga like toxin producing E coli (STEC) NOT DETECTED NOT DETECTED Final   Shigella/Enteroinvasive E coli (EIEC) NOT DETECTED NOT DETECTED Final   Cryptosporidium NOT DETECTED NOT DETECTED Final   Cyclospora cayetanensis NOT DETECTED NOT DETECTED Final   Entamoeba histolytica NOT DETECTED NOT DETECTED Final   Giardia lamblia NOT DETECTED NOT DETECTED Final   Adenovirus F40/41 NOT DETECTED NOT DETECTED Final   Astrovirus NOT DETECTED NOT DETECTED Final   Norovirus GI/GII NOT DETECTED NOT DETECTED Final   Rotavirus A NOT DETECTED NOT DETECTED Final   Sapovirus (I, II, IV, and V) NOT DETECTED NOT DETECTED Final    Comment: Performed at [facility], [address] [place], [place] [address]  C Difficile Quick Screen w PCR reflex     Status: Abnormal   Collection Time: 06/29/19 12:08 AM   Specimen: Stool  Result Value Ref Range Status   C Diff antigen POSITIVE (A) NEGATIVE Final   C Diff toxin NEGATIVE NEGATIVE Final   C Diff interpretation Results are indeterminate. See PCR results.  Final    Comment:  Performed at [facility], [address] [place], [place] [address]  Respiratory Panel by RT PCR (Flu A&B, Covid) - Nasopharyngeal Swab     Status: None   Collection Time: 06/29/19 12:08 AM   Specimen: Nasopharyngeal Swab  Result Value Ref Range Status   SARS Coronavirus 2 by RT PCR NEGATIVE NEGATIVE Final    Comment: (NOTE) SARS-CoV-2 target nucleic acids are NOT DETECTED. The SARS-CoV-2 RNA is generally detectable in upper respiratoy specimens during the acute phase of infection. The lowest concentration of SARS-CoV-2 viral copies this assay can detect is 131 copies/mL. A negative result does not preclude SARS-Cov-2 infection and should not be used as the sole basis for treatment or other patient management decisions. A negative result may occur with  improper specimen collection/handling, submission of specimen other than nasopharyngeal swab, presence of viral mutation(s) within the areas targeted by this assay, and inadequate number of viral copies (<131 copies/mL). A negative result must be combined with clinical observations, patient history, and epidemiological information. The expected result is Negative. Fact Sheet for Patients:  PinkCheek.be Fact Sheet for Healthcare Providers:  GravelBags.it This test is not yet ap proved or cleared by the [place] FDA and  has been authorized for detection and/or diagnosis of SARS-CoV-2 by FDA under an Emergency Use Authorization (EUA). This EUA will remain  in effect (meaning this test can be used) for the duration of the COVID-19 declaration under Section 564(b)(1) of the Act, 21 U.S.C. section 360bbb-3(b)(1), unless the authorization is terminated or revoked sooner.    Influenza A by PCR NEGATIVE NEGATIVE Final   Influenza B by PCR NEGATIVE NEGATIVE Final    Comment: (NOTE) The Xpert Xpress SARS-CoV-2/FLU/RSV assay is intended as an aid in  the diagnosis of  influenza from Nasopharyngeal swab specimens and  should not be used as a sole basis for treatment. Nasal washings and  aspirates are unacceptable for Xpert Xpress SARS-CoV-2/FLU/RSV  testing. Fact Sheet for Patients: PinkCheek.be Fact Sheet for Healthcare Providers: GravelBags.it This test is not yet approved or cleared by the [place] FDA and  has been authorized for detection and/or diagnosis of SARS-CoV-2 by  FDA under an Emergency Use Authorization (EUA). This EUA will remain  in effect (meaning this test can be used) for the duration of the  Covid-19 declaration under Section 564(b)(1) of the  Act, 21  U.S.C. section 360bbb-3(b)(1), unless the authorization is  terminated or revoked. Performed at [facility], [address], [place], [place] [address]   C. Diff by PCR, Reflexed     Status: Abnormal   Collection Time: 06/29/19 12:08 AM  Result Value Ref Range Status   Toxigenic C. Difficile by PCR POSITIVE (A) NEGATIVE Final    Comment: Positive for toxigenic C. difficile with little to no toxin production. Only treat if clinical presentation suggests symptomatic illness. Performed at [facility], [address], , [place] [address]   Urine Culture     Status: Abnormal   Collection Time: 06/29/19 12:39 PM   Specimen: Urine, Catheterized  Result Value Ref Range Status   Specimen Description   Final    URINE, CATHETERIZED Performed at [facility], [address], [place], [place] [address]    Special Requests   Final    NONE Performed at [facility], [address] [place], [place] [address]    Culture (A)  Final    <10,000 COLONIES/mL INSIGNIFICANT GROWTH Performed at [facility], [place] [address] [place], [place] [address]    Report Status 06/30/2019 FINAL  Final  CULTURE, BLOOD (ROUTINE X 2) w Reflex to ID Panel     Status: None (Preliminary result)    Collection Time: 06/29/19  3:04 PM   Specimen: BLOOD  Result Value Ref Range Status   Specimen Description BLOOD BLOOD RIGHT HAND  Final   Special Requests   Final    BOTTLES DRAWN AEROBIC ONLY Blood Culture results may not be optimal due to an inadequate volume of blood received in culture bottles   Culture   Final    NO GROWTH < 24 HOURS Performed at [facility], [address], [place], [place] [address]    Report Status PENDING  Incomplete  Culture, blood (Routine X 2) w Reflex to ID Panel     Status: None (Preliminary result)   Collection Time: 06/29/19 11:01 PM   Specimen: BLOOD  Result Value Ref Range Status   Specimen Description BLOOD BLOOD RIGHT HAND  Final   Special Requests   Final    BOTTLES DRAWN AEROBIC AND ANAEROBIC Blood Culture results may not be optimal due to an inadequate volume of blood received in culture bottles   Culture   Final    NO GROWTH < 12 HOURS Performed at [facility], [address], [place], [place] [address]    Report Status PENDING  Incomplete         Radiology Studies: DG Chest Portable 1 View  Result Date: 06/29/2019 CLINICAL DATA:  Hypoxia. EXAM: PORTABLE CHEST 1 VIEW COMPARISON:  Most recent radiograph 11/05/2017 FINDINGS: Mild cardiomegaly. Unchanged mediastinal contours. Nonspecific retrocardiac opacity. No focal airspace disease in the right lung. No pulmonary edema or pneumothorax. Thoracolumbar spinal fusion hardware. Hardware in the lower cervical spine is partially included. IMPRESSION: 1. Retrocardiac opacity which is nonspecific and not well evaluated. The may simply represent soft tissue attenuation from habitus and overlapping structures, however possibility of atelectasis/airspace disease and/or pleural effusion is raised. Consider follow-up PA and lateral views when patient is able. 2. Mild cardiomegaly. Electronically Signed   By: Keith Rake M.D.   On: 06/29/2019 01:00        Scheduled Meds: .  insulin aspart  0-15 Units Subcutaneous Q4H  . melatonin  2.5 mg Oral QHS  . pantoprazole (PROTONIX) IV  40 mg  Intravenous QHS  . traZODone  50 mg Oral QHS  . vancomycin  125 mg Oral QID   Continuous Infusions: . sodium chloride    . sodium chloride 100 mL/hr at 06/30/19 0300  . cefTRIAXone (ROCEPHIN)  IV Stopped (06/30/19 0054)     LOS: 1 day    Time spent: 35 min    Sidney Ace, MD [facility] Pager [phone]  If 7PM-7AM, please contact night-coverage 06/30/2019, 4:40 PM

## 2019-06-30 NOTE — Progress Notes (Signed)
ID Still not feeling well Has greenish diarrhea( has rectal tube) Tolerating clear fluids Some abdominal pain Patient Vitals for the past 24 hrs:  BP Temp Temp src Pulse Resp SpO2  06/30/19 1500 (!) 153/53 98.6 F (37 C) Oral 62 18 100 %  06/30/19 0732 (!) 144/58 99.7 F (37.6 C) Oral 65 20 99 %  06/30/19 0000 (!) 106/24 98.3 F (36.8 C) Oral 72 (!) 22 99 %  06/29/19 2340 (!) 82/46 98.3 F (36.8 C) Oral 72 17 98 %  06/29/19 2300 -- -- -- 72 -- --  06/29/19 2020 -- -- -- -- -- 98 %  06/29/19 1800 (!) 145/58 -- -- 74 20 100 %  06/29/19 1759 -- -- -- -- -- 99 %  06/29/19 1745 (!) 139/48 (!) 101 F (38.3 C) Oral 78 19 99 %  06/29/19 1645 (!) 126/53 -- -- 73 (!) 26 97 %  06/29/19 1630 109/81 -- -- 74 (!) 23 99 %  06/29/19 1615 (!) 140/57 -- -- 76 20 98 %  06/29/19 1600 (!) 151/61 -- -- 78 (!) 21 99 %  06/29/19 1545 (!) 147/55 -- -- 77 (!) 28 100 %  06/29/19 1530 (!) 140/56 -- -- 79 (!) 24 98 %  06/29/19 1515 (!) 136/55 -- -- 74 (!) 21 98 %   O/e awake, weak Chest b/l air entry- crepts HS afib rate controlled Abd soft distension, tender BS present  Labs CBC Latest Ref Rng & Units 06/30/2019 06/29/2019 06/28/2019  WBC 4.0 - 10.5 K/uL 6.2 7.6 7.9  Hemoglobin 13.0 - 17.0 g/dL 12.5(L) 13.7 6.1(L)  Hematocrit 39.0 - 52.0 % 39.0 42.8 19.8(L)  Platelets 150 - 400 K/uL 188 213 129(L)    CMP Latest Ref Rng & Units 06/30/2019 06/29/2019 06/28/2019  Glucose 70 - 99 mg/dL 146(H) 174(H) 93  BUN 8 - 23 mg/dL 57(H) 55(H) 50(H)  Creatinine 0.61 - 1.24 mg/dL 3.56(H) 3.44(H) 3.19(H)  Sodium 135 - 145 mmol/L 136 138 139  Potassium 3.5 - 5.1 mmol/L 3.2(L) 3.9 4.6  Chloride 98 - 111 mmol/L 105 104 105  CO2 22 - 32 mmol/L 19(L) 22 25  Calcium 8.9 - 10.3 mg/dL 8.3(L) 8.7(L) 9.3  Total Protein 6.5 - 8.1 g/dL - - 7.7  Total Bilirubin 0.3 - 1.2 mg/dL - - 1.0  Alkaline Phos 38 - 126 U/L - - 49  AST 15 - 41 U/L - - 18  ALT 0 - 44 U/L - - 13    Impression/recommendation Febrile illness with  gastroenteritis Stool PCR has salmonella This very likely is non typhoidal species- will send culture Not sure what put him at risk ? Food source  He ate sandwich ( fish) and meat from outside Need to make sure he does not have any immune compromising condition like malignancy/ Watch for bacteremia and endovascular infection R/o MM Continue IV ceftriaxone  Cdiff antigen positive , tox negative and PCR positive- no recent antibiotic or steroid use Could be colonization but because of profuse diarrhea and also being on IV antibiotic will treat like an infection.  Recommend CT abdomen if not improved by tomorrow  AKI on CKD worsening  Anemia- Hb 6 ( was 11)- received 2 units and Hb 12.5 FOB positive Watch for any frank GI bleed  Afib-   Hardware spine  CAD s/p CABG ? ?DM- on sliding scale  Discussed the management with patient

## 2019-07-01 ENCOUNTER — Inpatient Hospital Stay: Payer: Medicare HMO

## 2019-07-01 DIAGNOSIS — D649 Anemia, unspecified: Secondary | ICD-10-CM | POA: Diagnosis not present

## 2019-07-01 DIAGNOSIS — A0472 Enterocolitis due to Clostridium difficile, not specified as recurrent: Secondary | ICD-10-CM

## 2019-07-01 LAB — CBC WITH DIFFERENTIAL/PLATELET
Abs Immature Granulocytes: 0.03 10*3/uL (ref 0.00–0.07)
Basophils Absolute: 0 10*3/uL (ref 0.0–0.1)
Basophils Relative: 0 %
Eosinophils Absolute: 0.1 10*3/uL (ref 0.0–0.5)
Eosinophils Relative: 1 %
HCT: 37.8 % — ABNORMAL LOW (ref 39.0–52.0)
Hemoglobin: 12.3 g/dL — ABNORMAL LOW (ref 13.0–17.0)
Immature Granulocytes: 1 %
Lymphocytes Relative: 14 %
Lymphs Abs: 0.8 10*3/uL (ref 0.7–4.0)
MCH: 29.7 pg (ref 26.0–34.0)
MCHC: 32.5 g/dL (ref 30.0–36.0)
MCV: 91.3 fL (ref 80.0–100.0)
Monocytes Absolute: 0.5 10*3/uL (ref 0.1–1.0)
Monocytes Relative: 10 %
Neutro Abs: 3.9 10*3/uL (ref 1.7–7.7)
Neutrophils Relative %: 74 %
Platelets: 170 10*3/uL (ref 150–400)
RBC: 4.14 MIL/uL — ABNORMAL LOW (ref 4.22–5.81)
RDW: 14 % (ref 11.5–15.5)
WBC: 5.3 10*3/uL (ref 4.0–10.5)
nRBC: 0 % (ref 0.0–0.2)

## 2019-07-01 LAB — BASIC METABOLIC PANEL
Anion gap: 10 (ref 5–15)
BUN: 50 mg/dL — ABNORMAL HIGH (ref 8–23)
CO2: 21 mmol/L — ABNORMAL LOW (ref 22–32)
Calcium: 8.4 mg/dL — ABNORMAL LOW (ref 8.9–10.3)
Chloride: 105 mmol/L (ref 98–111)
Creatinine, Ser: 3.04 mg/dL — ABNORMAL HIGH (ref 0.61–1.24)
GFR calc Af Amer: 22 mL/min — ABNORMAL LOW (ref >60)
GFR calc non Af Amer: 19 mL/min — ABNORMAL LOW (ref >60)
Glucose, Bld: 184 mg/dL — ABNORMAL HIGH (ref 70–99)
Potassium: 3.4 mmol/L — ABNORMAL LOW (ref 3.5–5.1)
Sodium: 136 mmol/L (ref 135–145)

## 2019-07-01 LAB — GLUCOSE, CAPILLARY
Glucose-Capillary: 173 mg/dL — ABNORMAL HIGH (ref 70–99)
Glucose-Capillary: 178 mg/dL — ABNORMAL HIGH (ref 70–99)
Glucose-Capillary: 185 mg/dL — ABNORMAL HIGH (ref 70–99)
Glucose-Capillary: 206 mg/dL — ABNORMAL HIGH (ref 70–99)
Glucose-Capillary: 214 mg/dL — ABNORMAL HIGH (ref 70–99)
Glucose-Capillary: 292 mg/dL — ABNORMAL HIGH (ref 70–99)

## 2019-07-01 MED ORDER — VANCOMYCIN 50 MG/ML ORAL SOLUTION
500.0000 mg | Freq: Four times a day (QID) | ORAL | Status: DC
  Administered 2019-07-01 – 2019-07-05 (×14): 500 mg via ORAL
  Filled 2019-07-01 (×17): qty 10

## 2019-07-01 MED ORDER — METRONIDAZOLE IN NACL 5-0.79 MG/ML-% IV SOLN
500.0000 mg | Freq: Three times a day (TID) | INTRAVENOUS | Status: DC
  Administered 2019-07-01 – 2019-07-07 (×18): 500 mg via INTRAVENOUS
  Filled 2019-07-01 (×22): qty 100

## 2019-07-01 MED ORDER — ACETAMINOPHEN 325 MG PO TABS: 325.0000 mg | ORAL_TABLET | Freq: Four times a day (QID) | ORAL | Status: DC | PRN

## 2019-07-01 MED ORDER — FLUTICASONE PROPIONATE 50 MCG/ACT NA SUSP
2.0000 | Freq: Every day | NASAL | Status: DC
  Administered 2019-07-01 – 2019-07-07 (×7): 2 via NASAL
  Filled 2019-07-01: qty 16

## 2019-07-01 MED ORDER — VANCOMYCIN 50 MG/ML ORAL SOLUTION
500.0000 mg | Freq: Four times a day (QID) | ORAL | Status: DC
  Filled 2019-07-01 (×3): qty 10

## 2019-07-01 MED ORDER — APIXABAN 2.5 MG PO TABS
2.5000 mg | ORAL_TABLET | Freq: Two times a day (BID) | ORAL | Status: DC
  Administered 2019-07-01 – 2019-07-07 (×13): 2.5 mg via ORAL
  Filled 2019-07-01 (×13): qty 1

## 2019-07-01 MED ORDER — ASPIRIN EC 81 MG PO TBEC
81.0000 mg | DELAYED_RELEASE_TABLET | Freq: Every day | ORAL | Status: DC
  Administered 2019-07-01 – 2019-07-07 (×7): 81 mg via ORAL
  Filled 2019-07-01 (×7): qty 1

## 2019-07-01 MED ORDER — DIPHENHYDRAMINE HCL 25 MG PO CAPS
25.0000 mg | ORAL_CAPSULE | Freq: Every evening | ORAL | Status: DC | PRN
  Administered 2019-07-01 – 2019-07-06 (×6): 25 mg via ORAL
  Filled 2019-07-01 (×6): qty 1

## 2019-07-01 MED ORDER — AMLODIPINE BESYLATE 10 MG PO TABS
10.0000 mg | ORAL_TABLET | Freq: Every day | ORAL | Status: DC
  Administered 2019-07-01 – 2019-07-07 (×7): 10 mg via ORAL
  Filled 2019-07-01 (×7): qty 1

## 2019-07-01 MED ORDER — LATANOPROST 0.005 % OP SOLN
1.0000 [drp] | Freq: Every day | OPHTHALMIC | Status: DC
  Administered 2019-07-01 – 2019-07-06 (×6): 1 [drp] via OPHTHALMIC
  Filled 2019-07-01 (×2): qty 2.5

## 2019-07-01 MED ORDER — FINASTERIDE 5 MG PO TABS
5.0000 mg | ORAL_TABLET | Freq: Every day | ORAL | Status: DC
  Administered 2019-07-01 – 2019-07-07 (×7): 5 mg via ORAL
  Filled 2019-07-01 (×7): qty 1

## 2019-07-01 MED ORDER — ESCITALOPRAM OXALATE 10 MG PO TABS
15.0000 mg | ORAL_TABLET | Freq: Every day | ORAL | Status: DC
  Administered 2019-07-01 – 2019-07-07 (×7): 15 mg via ORAL
  Filled 2019-07-01 (×7): qty 1.5

## 2019-07-01 MED ORDER — TAMSULOSIN HCL 0.4 MG PO CAPS
0.4000 mg | ORAL_CAPSULE | Freq: Every day | ORAL | Status: DC
  Administered 2019-07-01 – 2019-07-07 (×7): 0.4 mg via ORAL
  Filled 2019-07-01 (×7): qty 1

## 2019-07-01 MED ORDER — GABAPENTIN 300 MG PO CAPS
400.0000 mg | ORAL_CAPSULE | Freq: Two times a day (BID) | ORAL | Status: DC
  Administered 2019-07-01 – 2019-07-07 (×13): 400 mg via ORAL
  Filled 2019-07-01 (×13): qty 1

## 2019-07-01 NOTE — Progress Notes (Signed)
Date of Admission:  06/28/2019     ID: Derek Andersen is a 79 y.o. male  Principal Problem:   Symptomatic anemia Active Problems:   Acute gastroenteritis: C diff and Salmonella positive    Suspect GI bleed   Acute renal failure superimposed on stage 4 chronic kidney disease (HCC)   Chronic anticoagulation   Atrial fibrillation, chronic (HCC)   CAD (coronary artery disease)   Type 2 diabetes mellitus with hyperlipidemia (HCC)   Clostridium difficile enteritis   Intestinal infection or food poisoning due to salmonella bacteria   Infectious colitis   C. difficile colitis    Subjective: Fatigued Says he did not sleep well Does not have nausea or vomiting   Medications:  . amLODipine  10 mg Oral Daily  . apixaban  2.5 mg Oral Q12H  . aspirin EC  81 mg Oral Daily  . escitalopram  15 mg Oral Daily  . finasteride  5 mg Oral Daily  . fluticasone  2 spray Each Nare Daily  . gabapentin  400 mg Oral BID  . insulin aspart  0-15 Units Subcutaneous Q4H  . latanoprost  1 drop Both Eyes QHS  . pantoprazole (PROTONIX) IV  40 mg Intravenous QHS  . tamsulosin  0.4 mg Oral Daily  . vancomycin  500 mg Oral QID    Objective: Vital signs in last 24 hours: Temp:  [98.5 F (36.9 C)-99 F (37.2 C)] 98.7 F (37.1 C) (04/22 1600) Pulse Rate:  [69] 69 (04/22 0022) Resp:  [20-25] 20 (04/22 1600) BP: (152-162)/(46-77) 162/77 (04/22 1600) SpO2:  [95 %-98 %] 95 % (04/22 1600)  PHYSICAL EXAM:  General:awake,,but quiet, looks ill  Nasal cannula Lungs:b/l air entry- decreased bases Heart: irregualr Abdomen: Soft, distended. Bowel sounds present Umbilical hernia Extremities: atraumatic, no cyanosis. No edema. No clubbing Skin: No rashes or lesions. Or bruising Lymph: Cervical, supraclavicular normal. Neurologic: Grossly non-focal  Lab Results Recent Labs    06/30/19 0539 07/01/19 0756  WBC 6.2 5.3  HGB 12.5* 12.3*  HCT 39.0 37.8*  NA 136 136  K 3.2* 3.4*  CL 105 105  CO2  19* 21*  BUN 57* 50*  CREATININE 3.56* 3.04*   Liver Panel Recent Labs    06/28/19 2002 06/30/19 0539  PROT 7.7 6.1*  ALBUMIN 3.8 2.8*  AST 18 20  ALT 13 15  ALKPHOS 49 35*  BILITOT 1.0 0.9  BILIDIR  --  0.2  IBILI  --  0.7   Sedimentation Rate No results for input(s): ESRSEDRATE in the last 72 hours. C-Reactive Protein No results for input(s): CRP in the last 72 hours.  Microbiology:  Studies/Results: CT ABDOMEN PELVIS WO CONTRAST  Result Date: 07/01/2019 CLINICAL DATA:  Infectious gastroenteritis or colitis, salmonella, C difficile toxin positive, nausea, vomiting EXAM: CT ABDOMEN AND PELVIS WITHOUT CONTRAST TECHNIQUE: Multidetector CT imaging of the abdomen and pelvis was performed following the standard protocol without IV contrast. COMPARISON:  None. FINDINGS: Lower chest: Small left, trace right pleural effusions and associated atelectasis or consolidation. Three-vessel coronary artery calcifications and/or stents. Hepatobiliary: No solid liver abnormality is seen. Gallstones near the gallbladder neck. No gallbladder wall thickening. No biliary ductal dilatation Pancreas: Unremarkable. No pancreatic ductal dilatation or surrounding inflammatory changes. Spleen: Normal in size without significant abnormality. Adrenals/Urinary Tract: Adrenal glands are unremarkable. Atrophic appearing kidneys bilaterally. There is a 1.4 cm calculus near the left ureteropelvic junction without hydronephrosis. Additional nonobstructive calculi in the inferior pole of the left kidney. Bladder is  unremarkable. Stomach/Bowel: Stomach is within normal limits. Appendix appears normal. The colon is fluid-filled throughout although nondilated, with mild fat stranding primarily noted about the transverse and proximal descending colon (series 2, image 46). Occasional sigmoid diverticula rectal tube. Vascular/Lymphatic: Aortic atherosclerosis. No enlarged abdominal or pelvic lymph nodes. Reproductive: No mass or  other significant abnormality. Other: Fat containing umbilical hernia. Small volume fluid about the left paracolic gutter. Musculoskeletal: Status post extensive posterior thoracic and lumbar laminectomy and fusion with a probable seromatous fluid collection overlying the L4 through upper sacral levels (series 9, image 85). 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 IMPRESSION: 1. The colon is fluid-filled throughout although nondilated, with mild fat stranding primarily noted about the transverse and proximal descending colon. Findings are generally in keeping with infectious or inflammatory colitis. Rectal tube is in position. 2. Small volume fluid in the left paracolic gutter, likely reactive. 3. There is a 1.4 cm calculus near the left ureteropelvic junction. No hydronephrosis. Additional nonobstructive calculi in the inferior pole of the left kidney. 4.  Cholelithiasis. 5. Bilateral pleural effusions and associated atelectasis or consolidation. Electronically Signed   By: Eddie Candle M.D.   On: 07/01/2019 13:17     Assessment/Plan: Febrile illness with gastroenteritis Stool PCR positive for  salmonella This very likely is non typhoidal species- will send culture Not sure what put him at risk ? Food source  He ate sandwich ( fish) and meat from outside Need to make sure he does not have any immune compromising condition like malignancy/ Watch for bacteremia and endovascular infection- bloodc ultrue negative so far R/o MM Continue IV ceftriaxone also confusing is that he has cdiffCdiff antigen positive , tox negative and PCR positive- no recent antibiotic or steroid use CT abdomen was done today as patient not improving as expected  colon is fluid-filled throughout although nondilated, with mild fat stranding primarily noted about the transverse and proximal descending colon. Findings are generally in keeping with infectious or inflammatory colitis. Will increase vanco to $Remove'500mg'LOGlXhE$  and will also  add IV flagyl Observe closely-   AKI on CKD improving slowly No HUS  Anemia- Hb 6 ( was 11)- received 2 units and Hb 12.5 FOB positive   Afib-   Hardware spine  CAD s/p CABG ? ?DM- on sliding scale  Discussed the management with hospitalist

## 2019-07-01 NOTE — Progress Notes (Signed)
PROGRESS NOTE    Derek Andersen  ZNZ:[id number] DOB: [birth year] DOA: 06/28/2019 PCP: Kandyce Rud, MD   Brief Narrative:  HPI: Derek Andersen is a 79 y.o. male with medical history significant for A. fib on Eliquis, CAD, CKD 4, DM, anemia of CKD, BPH and OSA who presents to the emergency room with a 3-day history of diarrhea and vomiting associated with weakness.  He denied fever, chills or abdominal pain.  Denies chest pain or shortness of breath.  His vomit is yellow gastric contents, nonbloody nonbilious and no coffee grounds.  Diarrhea is watery and yellow, no blood in the stool no black stool. No affected contacts.  4/21: Patient seen and examined.  Still significantly weakened and deconditioned.  Rectal tube remains in place.  Hemoglobin stable over interval.  4/22: Patient seen and examined.  Starting to improve.  Rectal tube remains in place.  Hemoglobin stable over interval.  No bleeding noted.  No fevers over interval.   Assessment & Plan:   Principal Problem:   Symptomatic anemia Active Problems:   Acute gastroenteritis: C diff and Salmonella positive    Suspect GI bleed   Acute renal failure superimposed on stage 4 chronic kidney disease (HCC)   Chronic anticoagulation   Atrial fibrillation, chronic (HCC)   CAD (coronary artery disease)   Type 2 diabetes mellitus with hyperlipidemia (HCC)   Clostridium difficile enteritis   Intestinal infection or food poisoning due to salmonella bacteria   Infectious colitis  Acute Salmonella gastroenteritis C. difficile toxin positive antigen negative 3 days of nausea vomiting Unclear trigger, possibly chicken salad versus fish sandwich Infectious disease consulted, started on oral Vanco and Rocephin Plan: Continue vancomycin p.o. Continue IV Rocephin Continue rectal tube for now, DC in morning if still volume decreasing CT abdomen pelvis without contrast  Symptomatic anemia Possible GI bleed Stool guaiac  positive Transfuse 2 units in ED GI consulted, no plans for scope, FOBT secondary to acute infectious colitis Plan: Continue to hold Eliquis IV PPI Trend H&H Treat infection as above  Acute renal failure superimposed on stage 4 chronic kidney disease (HCC) -IV hydration and monitor renal function -Consult nephrology if worsening Improving over interval    Chronic anticoagulation   Atrial fibrillation, chronic (HCC) -Patient on Eliquis for A. fib -Being held on account of bleeding    CAD (coronary artery disease) -No chest pain -We will hold antiplatelets in view of bleeding.   - Continue beta-blockers and statins pending med rec  Type 2 diabetes mellitus with hyperlipidemia (HCC) -Insulin sliding scale coverage   DVT prophylaxis: SCD Code Status: Full code Family Communication: None today Disposition Plan: Status is: Inpatient  Remains inpatient appropriate because:Inpatient level of care appropriate due to severity of illness   Dispo: The patient is from: Home              Anticipated d/c is to: Home              Anticipated d/c date is: 2 days              Patient currently is not medically stable to d/c.  Still with symptoms of infectious colitis.  Rectal tube remains in place.  Still with diarrhea.  Not a treatment goal.      Consultants:   GI  ID  Procedures:   none  Antimicrobials:  Rocephin  PO vancomycin    Subjective: Seen and examined Endorses some improvement in symptoms No fevers noted over interval  Objective: Vitals:   06/30/19 0732 06/30/19 1500 07/01/19 0022 07/01/19 0754  BP: (!) 144/58 (!) 153/53 (!) 152/46 (!) 155/54  Pulse: 65 62 69   Resp: 20 18 (!) 23 (!) 25  Temp: 99.7 F (37.6 C) 98.6 F (37 C) 98.5 F (36.9 C) 99 F (37.2 C)  TempSrc: Oral Oral Oral Oral  SpO2: 99% 100% 98% 97%  Weight:      Height:        Intake/Output Summary (Last 24 hours) at 07/01/2019 1411 Last data filed at 07/01/2019 1200 Gross  per 24 hour  Intake 1638.84 ml  Output 3050 ml  Net -1411.16 ml   Filed Weights   06/28/19 1959  Weight: 103.9 kg    Examination:  General exam: Appears calm and comfortable  Respiratory system: Clear to auscultation. Respiratory effort normal. Cardiovascular system: S1 & S2 heard, RRR. No JVD, murmurs, rubs, gallops or clicks. No pedal edema. Gastrointestinal system: Hyperactive bowel sounds, rectal tube in place Central nervous system: Alert and oriented. No focal neurological deficits. Extremities: Symmetric 5 x 5 power. Skin: No rashes, lesions or ulcers Psychiatry: Judgement and insight appear normal. Mood & affect appropriate.     Data Reviewed: I have personally reviewed following labs and imaging studies  CBC: Recent Labs  Lab 06/28/19 2002 06/29/19 1239 06/30/19 0539 07/01/19 0756  WBC 7.9 7.6 6.2 5.3  NEUTROABS  --   --   --  3.9  HGB 6.1* 13.7 12.5* 12.3*  HCT 19.8* 42.8 39.0 37.8*  MCV 99.0 93.7 92.9 91.3  PLT 129* 213 188 992   Basic Metabolic Panel: Recent Labs  Lab 06/28/19 2002 06/29/19 1239 06/30/19 0539 07/01/19 0756  NA 139 138 136 136  K 4.6 3.9 3.2* 3.4*  CL 105 104 105 105  CO2 25 22 19* 21*  GLUCOSE 93 174* 146* 184*  BUN 50* 55* 57* 50*  CREATININE 3.19* 3.44* 3.56* 3.04*  CALCIUM 9.3 8.7* 8.3* 8.4*   GFR: Estimated Creatinine Clearance: 23.3 mL/min (A) (by C-G formula based on SCr of 3.04 mg/dL (H)). Liver Function Tests: Recent Labs  Lab 06/28/19 2002 06/30/19 0539  AST 18 20  ALT 13 15  ALKPHOS 49 35*  BILITOT 1.0 0.9  PROT 7.7 6.1*  ALBUMIN 3.8 2.8*   Recent Labs  Lab 06/28/19 2002  LIPASE 24   No results for input(s): AMMONIA in the last 168 hours. Coagulation Profile: Recent Labs  Lab 06/28/19 2304  INR 1.1   Cardiac Enzymes: No results for input(s): CKTOTAL, CKMB, CKMBINDEX, TROPONINI in the last 168 hours. BNP (last 3 results) No results for input(s): PROBNP in the last 8760 hours. HbA1C: Recent Labs     06/29/19 0510  HGBA1C 6.1*   CBG: Recent Labs  Lab 06/30/19 1937 07/01/19 0028 07/01/19 0535 07/01/19 0936 07/01/19 1120  GLUCAP 269* 178* 185* 173* 206*   Lipid Profile: No results for input(s): CHOL, HDL, LDLCALC, TRIG, CHOLHDL, LDLDIRECT in the last 72 hours. Thyroid Function Tests: No results for input(s): TSH, T4TOTAL, FREET4, T3FREE, THYROIDAB in the last 72 hours. Anemia Panel: No results for input(s): VITAMINB12, FOLATE, FERRITIN, TIBC, IRON, RETICCTPCT in the last 72 hours. Sepsis Labs: Recent Labs  Lab 06/28/19 2304  LATICACIDVEN 0.4*    Recent Results (from the past 240 hour(s))  Gastrointestinal Panel by PCR , Stool     Status: Abnormal   Collection Time: 06/29/19 12:08 AM   Specimen: Stool  Result Value Ref Range Status   Campylobacter  species NOT DETECTED NOT DETECTED Final   Plesimonas shigelloides NOT DETECTED NOT DETECTED Final   Salmonella species DETECTED (A) NOT DETECTED Final    Comment: RESULT CALLED TO, READ BACK BY AND VERIFIED WITH: Adelene Idler RN [id number] 06/29/19 HNM    Yersinia enterocolitica NOT DETECTED NOT DETECTED Final   Vibrio species NOT DETECTED NOT DETECTED Final   Vibrio cholerae NOT DETECTED NOT DETECTED Final   Enteroaggregative E coli (EAEC) NOT DETECTED NOT DETECTED Final   Enteropathogenic E coli (EPEC) NOT DETECTED NOT DETECTED Final   Enterotoxigenic E coli (ETEC) NOT DETECTED NOT DETECTED Final   Shiga like toxin producing E coli (STEC) NOT DETECTED NOT DETECTED Final   Shigella/Enteroinvasive E coli (EIEC) NOT DETECTED NOT DETECTED Final   Cryptosporidium NOT DETECTED NOT DETECTED Final   Cyclospora cayetanensis NOT DETECTED NOT DETECTED Final   Entamoeba histolytica NOT DETECTED NOT DETECTED Final   Giardia lamblia NOT DETECTED NOT DETECTED Final   Adenovirus F40/41 NOT DETECTED NOT DETECTED Final   Astrovirus NOT DETECTED NOT DETECTED Final   Norovirus GI/GII NOT DETECTED NOT DETECTED Final   Rotavirus A NOT  DETECTED NOT DETECTED Final   Sapovirus (I, II, IV, and V) NOT DETECTED NOT DETECTED Final    Comment: Performed at [facility], [address] [place], [place] [address]  C Difficile Quick Screen w PCR reflex     Status: Abnormal   Collection Time: 06/29/19 12:08 AM   Specimen: Stool  Result Value Ref Range Status   C Diff antigen POSITIVE (A) NEGATIVE Final   C Diff toxin NEGATIVE NEGATIVE Final   C Diff interpretation Results are indeterminate. See PCR results.  Final    Comment: Performed at [facility], [address] [place], [place] [address]  Respiratory Panel by RT PCR (Flu A&B, Covid) - Nasopharyngeal Swab     Status: None   Collection Time: 06/29/19 12:08 AM   Specimen: Nasopharyngeal Swab  Result Value Ref Range Status   SARS Coronavirus 2 by RT PCR NEGATIVE NEGATIVE Final    Comment: (NOTE) SARS-CoV-2 target nucleic acids are NOT DETECTED. The SARS-CoV-2 RNA is generally detectable in upper respiratoy specimens during the acute phase of infection. The lowest concentration of SARS-CoV-2 viral copies this assay can detect is 131 copies/mL. A negative result does not preclude SARS-Cov-2 infection and should not be used as the sole basis for treatment or other patient management decisions. A negative result may occur with  improper specimen collection/handling, submission of specimen other than nasopharyngeal swab, presence of viral mutation(s) within the areas targeted by this assay, and inadequate number of viral copies (<131 copies/mL). A negative result must be combined with clinical observations, patient history, and epidemiological information. The expected result is Negative. Fact Sheet for Patients:  PinkCheek.be Fact Sheet for Healthcare Providers:  GravelBags.it This test is not yet ap proved or cleared by the [place] FDA and  has been authorized for detection and/or diagnosis  of SARS-CoV-2 by FDA under an Emergency Use Authorization (EUA). This EUA will remain  in effect (meaning this test can be used) for the duration of the COVID-19 declaration under Section 564(b)(1) of the Act, 21 U.S.C. section 360bbb-3(b)(1), unless the authorization is terminated or revoked sooner.    Influenza A by PCR NEGATIVE NEGATIVE Final   Influenza B by PCR NEGATIVE NEGATIVE Final    Comment: (NOTE) The Xpert Xpress SARS-CoV-2/FLU/RSV assay is intended as an aid in  the diagnosis of influenza from  Nasopharyngeal swab specimens and  should not be used as a sole basis for treatment. Nasal washings and  aspirates are unacceptable for Xpert Xpress SARS-CoV-2/FLU/RSV  testing. Fact Sheet for Patients: PinkCheek.be Fact Sheet for Healthcare Providers: GravelBags.it This test is not yet approved or cleared by the [place] FDA and  has been authorized for detection and/or diagnosis of SARS-CoV-2 by  FDA under an Emergency Use Authorization (EUA). This EUA will remain  in effect (meaning this test can be used) for the duration of the  Covid-19 declaration under Section 564(b)(1) of the Act, 21  U.S.C. section 360bbb-3(b)(1), unless the authorization is  terminated or revoked. Performed at [facility], [address] [place], [place] [address]   C. Diff by PCR, Reflexed     Status: Abnormal   Collection Time: 06/29/19 12:08 AM  Result Value Ref Range Status   Toxigenic C. Difficile by PCR POSITIVE (A) NEGATIVE Final    Comment: Positive for toxigenic C. difficile with little to no toxin production. Only treat if clinical presentation suggests symptomatic illness. Performed at [facility], [address], [place], [place] [address]   Urine Culture     Status: Abnormal   Collection Time: 06/29/19 12:39 PM   Specimen: Urine, Catheterized  Result Value Ref Range Status   Specimen Description    Final    URINE, CATHETERIZED Performed at [facility], [address] [place], [place] [address]    Special Requests   Final    NONE Performed at [facility], [address] [place], [place] [address]    Culture (A)  Final    <10,000 COLONIES/mL INSIGNIFICANT GROWTH Performed at [facility], [place] [address], [place], [place] [address]    Report Status 06/30/2019 FINAL  Final  CULTURE, BLOOD (ROUTINE X 2) w Reflex to ID Panel     Status: None (Preliminary result)   Collection Time: 06/29/19  3:04 PM   Specimen: BLOOD  Result Value Ref Range Status   Specimen Description BLOOD BLOOD RIGHT HAND  Final   Special Requests   Final    BOTTLES DRAWN AEROBIC ONLY Blood Culture results may not be optimal due to an inadequate volume of blood received in culture bottles   Culture   Final    NO GROWTH 2 DAYS Performed at [facility], [address], [place], [place] [address]    Report Status PENDING  Incomplete  Culture, blood (Routine X 2) w Reflex to ID Panel     Status: None (Preliminary result)   Collection Time: 06/29/19 11:01 PM   Specimen: BLOOD  Result Value Ref Range Status   Specimen Description BLOOD BLOOD RIGHT HAND  Final   Special Requests   Final    BOTTLES DRAWN AEROBIC AND ANAEROBIC Blood Culture results may not be optimal due to an inadequate volume of blood received in culture bottles   Culture   Final    NO GROWTH 2 DAYS Performed at [facility], [address], [place], [place] [address]    Report Status PENDING  Incomplete         Radiology Studies: CT ABDOMEN PELVIS WO CONTRAST  Result Date: 07/01/2019 CLINICAL DATA:  Infectious gastroenteritis or colitis, salmonella, C difficile toxin positive, nausea, vomiting EXAM: CT ABDOMEN AND PELVIS WITHOUT CONTRAST TECHNIQUE: Multidetector CT imaging of the abdomen and pelvis was performed following the standard protocol without IV contrast. COMPARISON:  None.  FINDINGS: Lower chest: Small left,  trace right pleural effusions and associated atelectasis or consolidation. Three-vessel coronary artery calcifications and/or stents. Hepatobiliary: No solid liver abnormality is seen. Gallstones near the gallbladder neck. No gallbladder wall thickening. No biliary ductal dilatation Pancreas: Unremarkable. No pancreatic ductal dilatation or surrounding inflammatory changes. Spleen: Normal in size without significant abnormality. Adrenals/Urinary Tract: Adrenal glands are unremarkable. Atrophic appearing kidneys bilaterally. There is a 1.4 cm calculus near the left ureteropelvic junction without hydronephrosis. Additional nonobstructive calculi in the inferior pole of the left kidney. Bladder is unremarkable. Stomach/Bowel: Stomach is within normal limits. Appendix appears normal. The colon is fluid-filled throughout although nondilated, with mild fat stranding primarily noted about the transverse and proximal descending colon (series 2, image 46). Occasional sigmoid diverticula rectal tube. Vascular/Lymphatic: Aortic atherosclerosis. No enlarged abdominal or pelvic lymph nodes. Reproductive: No mass or other significant abnormality. Other: Fat containing umbilical hernia. Small volume fluid about the left paracolic gutter. Musculoskeletal: Status post extensive posterior thoracic and lumbar laminectomy and fusion with a probable seromatous fluid collection overlying the L4 through upper sacral levels (series 9, image 85). 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 IMPRESSION: 1. The colon is fluid-filled throughout although nondilated, with mild fat stranding primarily noted about the transverse and proximal descending colon. Findings are generally in keeping with infectious or inflammatory colitis. Rectal tube is in position. 2. Small volume fluid in the left paracolic gutter, likely reactive. 3. There is a 1.4 cm calculus near the left ureteropelvic junction. No hydronephrosis.  Additional nonobstructive calculi in the inferior pole of the left kidney. 4.  Cholelithiasis. 5. Bilateral pleural effusions and associated atelectasis or consolidation. Electronically Signed   By: Eddie Candle M.D.   On: 07/01/2019 13:17        Scheduled Meds: . amLODipine  10 mg Oral Daily  . apixaban  2.5 mg Oral Q12H  . aspirin EC  81 mg Oral Daily  . escitalopram  15 mg Oral Daily  . finasteride  5 mg Oral Daily  . fluticasone  2 spray Each Nare Daily  . gabapentin  400 mg Oral BID  . insulin aspart  0-15 Units Subcutaneous Q4H  . latanoprost  1 drop Both Eyes QHS  . pantoprazole (PROTONIX) IV  40 mg Intravenous QHS  . tamsulosin  0.4 mg Oral Daily  . vancomycin  125 mg Oral QID   Continuous Infusions: . sodium chloride    . sodium chloride 100 mL/hr at 07/01/19 1047  . cefTRIAXone (ROCEPHIN)  IV 2 g (07/01/19 0047)     LOS: 2 days    Time spent: 35 min    Sidney Ace, MD [facility] Pager [phone]  If 7PM-7AM, please contact night-coverage 07/01/2019, 2:11 PM

## 2019-07-01 NOTE — Progress Notes (Signed)
Derek Lame, MD [facility]   [address], [place], [place] [address] Phone: [phone] Fax : [phone]   Subjective: This patient has infectious colitis with testing positive for C. difficile and salmonella.  The patient was admitted with a hemoglobin of 6.1 and received 2 units of blood with his hemoglobin going up to 13.7 and it was 12.5 today.  The patient does not report feeling any better or worse today.  He continues to have loose bowel movements without any visible blood.   Objective: Vital signs in last 24 hours: Vitals:   06/30/19 0732 06/30/19 1500 07/01/19 0022 07/01/19 0754  BP: (!) 144/58 (!) 153/53 (!) 152/46 (!) 155/54  Pulse: 65 62 69   Resp: 20 18 (!) 23 (!) 25  Temp: 99.7 F (37.6 C) 98.6 F (37 C) 98.5 F (36.9 C) 99 F (37.2 C)  TempSrc: Oral Oral Oral Oral  SpO2: 99% 100% 98% 97%  Weight:      Height:       Weight change:   Intake/Output Summary (Last 24 hours) at 07/01/2019 1616 Last data filed at 07/01/2019 1200 Gross per 24 hour  Intake 1638.84 ml  Output 3050 ml  Net -1411.16 ml     Exam: Heart:: Regular rate and rhythm, S1S2 present or without murmur or extra heart sounds Lungs: normal, clear to auscultation and clear to auscultation and percussion Abdomen: soft, nontender, normal bowel sounds   Lab Results: $[email or web address]'@[email or web address]'[email or web address]$ @ Micro Results: Recent Results (from the past 240 hour(s))  Gastrointestinal Panel by PCR , Stool     Status: Abnormal   Collection Time: 06/29/19 12:08 AM   Specimen: Stool  Result Value Ref Range Status   Campylobacter species NOT DETECTED NOT DETECTED Final   Plesimonas shigelloides NOT DETECTED NOT DETECTED Final   Salmonella species DETECTED (A) NOT DETECTED Final    Comment: RESULT CALLED TO, READ BACK BY AND VERIFIED WITH: Adelene Idler RN [id number] 06/29/19 HNM    Yersinia enterocolitica NOT DETECTED NOT DETECTED Final   Vibrio species NOT DETECTED NOT DETECTED Final   Vibrio cholerae NOT DETECTED NOT  DETECTED Final   Enteroaggregative E coli (EAEC) NOT DETECTED NOT DETECTED Final   Enteropathogenic E coli (EPEC) NOT DETECTED NOT DETECTED Final   Enterotoxigenic E coli (ETEC) NOT DETECTED NOT DETECTED Final   Shiga like toxin producing E coli (STEC) NOT DETECTED NOT DETECTED Final   Shigella/Enteroinvasive E coli (EIEC) NOT DETECTED NOT DETECTED Final   Cryptosporidium NOT DETECTED NOT DETECTED Final   Cyclospora cayetanensis NOT DETECTED NOT DETECTED Final   Entamoeba histolytica NOT DETECTED NOT DETECTED Final   Giardia lamblia NOT DETECTED NOT DETECTED Final   Adenovirus F40/41 NOT DETECTED NOT DETECTED Final   Astrovirus NOT DETECTED NOT DETECTED Final   Norovirus GI/GII NOT DETECTED NOT DETECTED Final   Rotavirus A NOT DETECTED NOT DETECTED Final   Sapovirus (I, II, IV, and V) NOT DETECTED NOT DETECTED Final    Comment: Performed at [facility], [address] [place], [place] [address]  C Difficile Quick Screen w PCR reflex     Status: Abnormal   Collection Time: 06/29/19 12:08 AM   Specimen: Stool  Result Value Ref Range Status   C Diff antigen POSITIVE (A) NEGATIVE Final   C Diff toxin NEGATIVE NEGATIVE Final   C Diff interpretation Results are indeterminate. See PCR results.  Final    Comment: Performed at [facility], [address] [place], [place] [address]  Respiratory  Panel by RT PCR (Flu A&B, Covid) - Nasopharyngeal Swab     Status: None   Collection Time: 06/29/19 12:08 AM   Specimen: Nasopharyngeal Swab  Result Value Ref Range Status   SARS Coronavirus 2 by RT PCR NEGATIVE NEGATIVE Final    Comment: (NOTE) SARS-CoV-2 target nucleic acids are NOT DETECTED. The SARS-CoV-2 RNA is generally detectable in upper respiratoy specimens during the acute phase of infection. The lowest concentration of SARS-CoV-2 viral copies this assay can detect is 131 copies/mL. A negative result does not preclude SARS-Cov-2 infection and should not be used as the  sole basis for treatment or other patient management decisions. A negative result may occur with  improper specimen collection/handling, submission of specimen other than nasopharyngeal swab, presence of viral mutation(s) within the areas targeted by this assay, and inadequate number of viral copies (<131 copies/mL). A negative result must be combined with clinical observations, patient history, and epidemiological information. The expected result is Negative. Fact Sheet for Patients:  PinkCheek.be Fact Sheet for Healthcare Providers:  GravelBags.it This test is not yet ap proved or cleared by the [place] FDA and  has been authorized for detection and/or diagnosis of SARS-CoV-2 by FDA under an Emergency Use Authorization (EUA). This EUA will remain  in effect (meaning this test can be used) for the duration of the COVID-19 declaration under Section 564(b)(1) of the Act, 21 U.S.C. section 360bbb-3(b)(1), unless the authorization is terminated or revoked sooner.    Influenza A by PCR NEGATIVE NEGATIVE Final   Influenza B by PCR NEGATIVE NEGATIVE Final    Comment: (NOTE) The Xpert Xpress SARS-CoV-2/FLU/RSV assay is intended as an aid in  the diagnosis of influenza from Nasopharyngeal swab specimens and  should not be used as a sole basis for treatment. Nasal washings and  aspirates are unacceptable for Xpert Xpress SARS-CoV-2/FLU/RSV  testing. Fact Sheet for Patients: PinkCheek.be Fact Sheet for Healthcare Providers: GravelBags.it This test is not yet approved or cleared by the [place] FDA and  has been authorized for detection and/or diagnosis of SARS-CoV-2 by  FDA under an Emergency Use Authorization (EUA). This EUA will remain  in effect (meaning this test can be used) for the duration of the  Covid-19 declaration under Section 564(b)(1) of the Act, 21   U.S.C. section 360bbb-3(b)(1), unless the authorization is  terminated or revoked. Performed at [facility], [address] [place], [place] [address]   C. Diff by PCR, Reflexed     Status: Abnormal   Collection Time: 06/29/19 12:08 AM  Result Value Ref Range Status   Toxigenic C. Difficile by PCR POSITIVE (A) NEGATIVE Final    Comment: Positive for toxigenic C. difficile with little to no toxin production. Only treat if clinical presentation suggests symptomatic illness. Performed at [facility], [address], [place], [place] [address]   Urine Culture     Status: Abnormal   Collection Time: 06/29/19 12:39 PM   Specimen: Urine, Catheterized  Result Value Ref Range Status   Specimen Description   Final    URINE, CATHETERIZED Performed at [facility], [address] [place], [place] [address]    Special Requests   Final    NONE Performed at [facility], [address] [place], [place] [address]    Culture (A)  Final    <10,000 COLONIES/mL INSIGNIFICANT GROWTH Performed at [facility], [place] [address] [place], [place] [address]    Report Status 06/30/2019 FINAL  Final  CULTURE, BLOOD (ROUTINE X 2) w Reflex to ID Panel     Status: None (Preliminary result)   Collection Time: 06/29/19  3:04 PM   Specimen: BLOOD  Result Value Ref Range Status   Specimen Description BLOOD BLOOD RIGHT HAND  Final   Special Requests   Final    BOTTLES DRAWN AEROBIC ONLY Blood Culture results may not be optimal due to an inadequate volume of blood received in culture bottles   Culture   Final    NO GROWTH 2 DAYS Performed at [facility], [address] [place], [place] [address]    Report Status PENDING  Incomplete  Culture, blood (Routine X 2) w Reflex to ID Panel     Status: None (Preliminary result)   Collection Time: 06/29/19 11:01 PM   Specimen: BLOOD  Result Value Ref Range Status   Specimen Description BLOOD BLOOD RIGHT  HAND  Final   Special Requests   Final    BOTTLES DRAWN AEROBIC AND ANAEROBIC Blood Culture results may not be optimal due to an inadequate volume of blood received in culture bottles   Culture   Final    NO GROWTH 2 DAYS Performed at [facility], [address] [place], [place] [address]    Report Status PENDING  Incomplete   Studies/Results: CT ABDOMEN PELVIS WO CONTRAST  Result Date: 07/01/2019 CLINICAL DATA:  Infectious gastroenteritis or colitis, salmonella, C difficile toxin positive, nausea, vomiting EXAM: CT ABDOMEN AND PELVIS WITHOUT CONTRAST TECHNIQUE: Multidetector CT imaging of the abdomen and pelvis was performed following the standard protocol without IV contrast. COMPARISON:  None. FINDINGS: Lower chest: Small left, trace right pleural effusions and associated atelectasis or consolidation. Three-vessel coronary artery calcifications and/or stents. Hepatobiliary: No solid liver abnormality is seen. Gallstones near the gallbladder neck. No gallbladder wall thickening. No biliary ductal dilatation Pancreas: Unremarkable. No pancreatic ductal dilatation or surrounding inflammatory changes. Spleen: Normal in size without significant abnormality. Adrenals/Urinary Tract: Adrenal glands are unremarkable. Atrophic appearing kidneys bilaterally. There is a 1.4 cm calculus near the left ureteropelvic junction without hydronephrosis. Additional nonobstructive calculi in the inferior pole of the left kidney. Bladder is unremarkable. Stomach/Bowel: Stomach is within normal limits. Appendix appears normal. The colon is fluid-filled throughout although nondilated, with mild fat stranding primarily noted about the transverse and proximal descending colon (series 2, image 46). Occasional sigmoid diverticula rectal tube. Vascular/Lymphatic: Aortic atherosclerosis. No enlarged abdominal or pelvic lymph nodes. Reproductive: No mass or other significant abnormality. Other: Fat containing umbilical  hernia. Small volume fluid about the left paracolic gutter. Musculoskeletal: Status post extensive posterior thoracic and lumbar laminectomy and fusion with a probable seromatous fluid collection overlying the L4 through upper sacral levels (series 9, image 85). 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 IMPRESSION: 1. The colon is fluid-filled throughout although nondilated, with mild fat stranding primarily noted about the transverse and proximal descending colon. Findings are generally in keeping with infectious or inflammatory colitis. Rectal tube is in position. 2. Small volume fluid in the left paracolic gutter, likely reactive. 3. There is a 1.4 cm calculus near the left ureteropelvic junction. No hydronephrosis. Additional nonobstructive calculi in the inferior pole of the left kidney. 4.  Cholelithiasis. 5. Bilateral pleural effusions and associated atelectasis or consolidation. Electronically Signed   By: Lauralyn Primes M.D.   On: 07/01/2019 13:17   Medications: I have reviewed the patient's current medications. Scheduled Meds: . amLODipine  10 mg Oral Daily  . apixaban  2.5 mg Oral Q12H  . aspirin EC  81 mg Oral Daily  . escitalopram  15 mg Oral Daily  . finasteride  5 mg Oral Daily  . fluticasone  2 spray Each Nare Daily  . gabapentin  400 mg Oral BID  . insulin aspart  0-15 Units Subcutaneous Q4H  . latanoprost  1 drop Both Eyes QHS  . pantoprazole (PROTONIX) IV  40 mg Intravenous QHS  . tamsulosin  0.4 mg Oral Daily  . vancomycin  500 mg Oral QID   Continuous Infusions: . sodium chloride    . sodium chloride 100 mL/hr at 07/01/19 1047  . cefTRIAXone (ROCEPHIN)  IV 2 g (07/01/19 0047)   PRN Meds:.acetaminophen **OR** acetaminophen, diphenhydrAMINE, ondansetron **OR** ondansetron (ZOFRAN) IV   Assessment: Principal Problem:   Symptomatic anemia Active Problems:   Acute gastroenteritis: C diff and Salmonella positive    Suspect GI bleed   Acute renal failure superimposed on  stage 4 chronic kidney disease (HCC)   Chronic anticoagulation   Atrial fibrillation, chronic (HCC)   CAD (coronary artery disease)   Type 2 diabetes mellitus with hyperlipidemia (HCC)   Clostridium difficile enteritis   Intestinal infection or food poisoning due to salmonella bacteria   Infectious colitis    Plan: This patient was admitted with colitis from C. difficile and salmonella.  I was consulted for the patient having diarrhea prior to the pathogens being found and because of anemia.  The patient's hemoglobin is 6.1 corrected to 13.7 after 2 units of blood consistent with this original 6.1 likely being incorrect.  The patient's heme positive stools, which is a test for screening purposes, is positive due to his colitis.  There is nothing further to do from a GI point of view.  I will sign off.  Please call if any further GI concerns or questions.  We would like to thank you for the opportunity to participate in the care of Derek Andersen.     LOS: 2 days   Derek Andersen 07/01/2019, 4:16 PM Pager [phone] 7am-5pm  Check AMION for 5pm -7am coverage and on weekends

## 2019-07-02 DIAGNOSIS — A020 Salmonella enteritis: Principal | ICD-10-CM

## 2019-07-02 DIAGNOSIS — D649 Anemia, unspecified: Secondary | ICD-10-CM | POA: Diagnosis not present

## 2019-07-02 DIAGNOSIS — Z933 Colostomy status: Secondary | ICD-10-CM

## 2019-07-02 DIAGNOSIS — E119 Type 2 diabetes mellitus without complications: Secondary | ICD-10-CM

## 2019-07-02 LAB — CBC WITH DIFFERENTIAL/PLATELET
Abs Immature Granulocytes: 0.04 10*3/uL (ref 0.00–0.07)
Basophils Absolute: 0 10*3/uL (ref 0.0–0.1)
Basophils Relative: 0 %
Eosinophils Absolute: 0.1 10*3/uL (ref 0.0–0.5)
Eosinophils Relative: 1 %
HCT: 36.6 % — ABNORMAL LOW (ref 39.0–52.0)
Hemoglobin: 12.2 g/dL — ABNORMAL LOW (ref 13.0–17.0)
Immature Granulocytes: 1 %
Lymphocytes Relative: 11 %
Lymphs Abs: 0.9 10*3/uL (ref 0.7–4.0)
MCH: 29.9 pg (ref 26.0–34.0)
MCHC: 33.3 g/dL (ref 30.0–36.0)
MCV: 89.7 fL (ref 80.0–100.0)
Monocytes Absolute: 0.9 10*3/uL (ref 0.1–1.0)
Monocytes Relative: 12 %
Neutro Abs: 5.8 10*3/uL (ref 1.7–7.7)
Neutrophils Relative %: 75 %
Platelets: 194 10*3/uL (ref 150–400)
RBC: 4.08 MIL/uL — ABNORMAL LOW (ref 4.22–5.81)
RDW: 13.9 % (ref 11.5–15.5)
WBC: 7.7 10*3/uL (ref 4.0–10.5)
nRBC: 0 % (ref 0.0–0.2)

## 2019-07-02 LAB — BASIC METABOLIC PANEL
Anion gap: 10 (ref 5–15)
BUN: 39 mg/dL — ABNORMAL HIGH (ref 8–23)
CO2: 21 mmol/L — ABNORMAL LOW (ref 22–32)
Calcium: 8.5 mg/dL — ABNORMAL LOW (ref 8.9–10.3)
Chloride: 107 mmol/L (ref 98–111)
Creatinine, Ser: 2.37 mg/dL — ABNORMAL HIGH (ref 0.61–1.24)
GFR calc Af Amer: 30 mL/min — ABNORMAL LOW (ref >60)
GFR calc non Af Amer: 26 mL/min — ABNORMAL LOW (ref >60)
Glucose, Bld: 168 mg/dL — ABNORMAL HIGH (ref 70–99)
Potassium: 3.3 mmol/L — ABNORMAL LOW (ref 3.5–5.1)
Sodium: 138 mmol/L (ref 135–145)

## 2019-07-02 LAB — GLUCOSE, CAPILLARY
Glucose-Capillary: 158 mg/dL — ABNORMAL HIGH (ref 70–99)
Glucose-Capillary: 168 mg/dL — ABNORMAL HIGH (ref 70–99)
Glucose-Capillary: 170 mg/dL — ABNORMAL HIGH (ref 70–99)
Glucose-Capillary: 207 mg/dL — ABNORMAL HIGH (ref 70–99)
Glucose-Capillary: 245 mg/dL — ABNORMAL HIGH (ref 70–99)
Glucose-Capillary: 321 mg/dL — ABNORMAL HIGH (ref 70–99)

## 2019-07-02 MED ORDER — BUTALBITAL-APAP-CAFFEINE 50-325-40 MG PO TABS
1.0000 | ORAL_TABLET | ORAL | Status: DC | PRN
  Administered 2019-07-02: 1 via ORAL
  Filled 2019-07-02: qty 1

## 2019-07-02 NOTE — Progress Notes (Signed)
PROGRESS NOTE    Derek Andersen  SEG:[id number] DOB: Jul 26, 1940 DOA: 06/28/2019 PCP: Derinda Late, MD   Brief Narrative:  HPI: Derek Andersen is a 79 y.o. male with medical history significant for A. fib on Eliquis, CAD, CKD 4, DM, anemia of CKD, BPH and OSA who presents to the emergency room with a 3-day history of diarrhea and vomiting associated with weakness.  He denied fever, chills or abdominal pain.  Denies chest pain or shortness of breath.  His vomit is yellow gastric contents, nonbloody nonbilious and no coffee grounds.  Diarrhea is watery and yellow, no blood in the stool no black stool. No affected contacts.  4/21: Patient seen and examined.  Still significantly weakened and deconditioned.  Rectal tube remains in place.  Hemoglobin stable over interval.  4/22: Patient seen and examined.  Starting to improve.  Rectal tube remains in place.  Hemoglobin stable over interval.  No bleeding noted.  No fevers over interval.  4/23: Patient seen and examined.  Rectal tube in place.  Very loose stools persistent.  Discussed with ID.  Vancomycin increased to 500 mg p.o. every 6 hours.  Continues on IV Rocephin.   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 difficile colitis  Acute Salmonella gastroenteritis C. difficile toxin positive antigen negative 3 days of nausea vomiting Unclear trigger, possibly chicken salad versus fish sandwich Infectious disease consulted, started on oral Vanco and Rocephin CT abd- fluid  filled colon, stranding consistent with colitis Plan: Continue vancomycin p.o. 500 q6h Continue IV Rocephin Continue rectal tube for now, consider DC in morning if still volume decreasing  Symptomatic anemia Possible GI bleed Stool guaiac positive Transfuse 2 units in ED GI consulted, no plans for scope, FOBT secondary to acute infectious colitis Plan: Eliquis restarted IV PPI Trend H&H Treat infection as above  Acute renal failure superimposed on stage 4 chronic kidney disease (HCC) -IV hydration and monitor renal function -Consult nephrology if worsening Improving over interval    Chronic anticoagulation   Atrial fibrillation, chronic (HCC) -Patient on Eliquis for A. fib -Restarted 4/22, no bleeding noted    CAD (coronary artery disease) -No chest pain -Eliquis restarted - Continue beta-blockers and statins  Type 2 diabetes mellitus with hyperlipidemia (HCC) -Insulin sliding scale coverage   DVT prophylaxis: Eliquis Code Status: Full code Family Communication: None today, offered to call, patient declined Disposition Plan: Status is: Inpatient  Remains inpatient appropriate because:Inpatient level of care appropriate due to severity of illness   Dispo: The patient is from: Home              Anticipated d/c is to: Home              Anticipated d/c date is: 2 days              Patient currently is not medically stable to d/c.  Still with symptoms of infectious colitis.  Rectal tube remains in place.  Still with diarrhea.  Not at treatment goal.  Consultants:   GI  ID  Procedures:   none  Antimicrobials:  Rocephin  PO vancomycin    Subjective: Seen and examined Endorses some improvement in symptoms No fevers noted over interval  Objective: Vitals:   07/02/19 0000 07/02/19 0059 07/02/19 0101 07/02/19 0737  BP: (!) 143/45 (!) 165/51  (!) 164/55  Pulse:      Resp: (!) 26 (!) 27 (!) 24 (!) 22  Temp: 99.1 F (37.3 C) 98.8 F (37.1  C)  98.8 F (37.1 C)  TempSrc: Oral Oral  Axillary  SpO2: 96% 96%  97%  Weight:      Height:        Intake/Output Summary (Last 24 hours) at 07/02/2019 1334 Last data filed at 07/02/2019 0951 Gross per 24 hour  Intake 1089.1 ml  Output 1700 ml  Net -610.9 ml   Filed Weights   06/28/19 1959  Weight: 103.9 kg    Examination:  General exam: Appears calm and comfortable  Respiratory system: Clear to auscultation. Respiratory effort normal. Cardiovascular system: S1 & S2 heard, RRR. No JVD, murmurs, rubs, gallops or clicks. No pedal edema. Gastrointestinal system: Hyperactive bowel sounds, rectal tube in place Central nervous system: Alert and oriented. No focal neurological deficits. Extremities: Symmetric 5 x 5 power. Skin: No rashes, lesions or ulcers Psychiatry: Judgement and insight appear normal. Mood & affect appropriate.     Data Reviewed: I have personally reviewed following labs and imaging studies  CBC: Recent Labs  Lab 06/28/19 2002 06/29/19 1239 06/30/19 0539 07/01/19 0756 07/02/19 0632  WBC 7.9 7.6 6.2 5.3 7.7  NEUTROABS  --   --   --  3.9 5.8  HGB 6.1* 13.7 12.5* 12.3* 12.2*  HCT 19.8* 42.8 39.0 37.8* 36.6*  MCV 99.0 93.7 92.9 91.3 89.7  PLT 129* 213 188 170 664   Basic Metabolic Panel: Recent Labs  Lab 06/28/19 2002 06/29/19 1239 06/30/19 0539 07/01/19 0756 07/02/19 0632  NA 139 138 136 136 138  K 4.6 3.9 3.2* 3.4* 3.3*  CL 105 104 105 105 107  CO2 25 22 19* 21* 21*  GLUCOSE 93 174* 146* 184* 168*  BUN 50* 55* 57* 50* 39*  CREATININE 3.19* 3.44* 3.56* 3.04* 2.37*  CALCIUM 9.3 8.7* 8.3* 8.4* 8.5*   GFR: Estimated Creatinine Clearance: 29.9 mL/min (A) (by C-G formula based on SCr of 2.37 mg/dL (H)). Liver Function Tests: Recent Labs  Lab 06/28/19 2002 06/30/19 0539  AST 18 20  ALT 13 15  ALKPHOS 49 35*  BILITOT 1.0 0.9  PROT 7.7 6.1*  ALBUMIN 3.8 2.8*   Recent Labs  Lab 06/28/19 2002  LIPASE 24   No results for input(s):  AMMONIA in the last 168 hours. Coagulation Profile: Recent Labs  Lab 06/28/19 2304  INR 1.1   Cardiac Enzymes: No results for input(s): CKTOTAL, CKMB, CKMBINDEX, TROPONINI in the last 168 hours. BNP (last 3 results) No results for input(s): PROBNP in the last 8760 hours. HbA1C: No results for input(s): HGBA1C in the last 72 hours. CBG: Recent Labs  Lab 07/01/19 2100 07/02/19 0028 07/02/19 0527 07/02/19 0737 07/02/19 1214  GLUCAP 214* 170* 168* 158* 207*   Lipid Profile: No results for input(s): CHOL, HDL, LDLCALC, TRIG, CHOLHDL, LDLDIRECT in the last 72 hours. Thyroid Function Tests: No results for input(s): TSH, T4TOTAL, FREET4, T3FREE, THYROIDAB in the last 72 hours. Anemia Panel: No results for input(s): VITAMINB12, FOLATE, FERRITIN, TIBC, IRON, RETICCTPCT in the last 72 hours. Sepsis Labs: Recent Labs  Lab 06/28/19 2304  LATICACIDVEN 0.4*    Recent Results (from the past 240 hour(s))  Gastrointestinal Panel by PCR , Stool     Status: Abnormal   Collection Time: 06/29/19 12:08 AM   Specimen: Stool  Result Value Ref Range Status   Campylobacter species NOT DETECTED NOT DETECTED Final   Plesimonas shigelloides NOT DETECTED NOT DETECTED Final   Salmonella species DETECTED (A) NOT DETECTED Final    Comment: RESULT CALLED TO, READ BACK BY AND VERIFIED WITH: Adelene Idler RN 0142 06/29/19 HNM    Yersinia enterocolitica NOT DETECTED NOT DETECTED Final   Vibrio species NOT DETECTED NOT DETECTED Final   Vibrio cholerae NOT DETECTED NOT DETECTED Final   Enteroaggregative E coli (EAEC) NOT DETECTED NOT DETECTED Final   Enteropathogenic E coli (EPEC) NOT DETECTED NOT DETECTED Final   Enterotoxigenic E coli (ETEC) NOT DETECTED NOT DETECTED Final   Shiga like toxin producing E coli (STEC) NOT DETECTED NOT DETECTED Final   Shigella/Enteroinvasive E coli (EIEC) NOT DETECTED NOT DETECTED Final   Cryptosporidium NOT DETECTED NOT DETECTED Final   Cyclospora cayetanensis NOT  DETECTED NOT DETECTED Final   Entamoeba histolytica NOT DETECTED NOT DETECTED Final   Giardia lamblia NOT DETECTED NOT DETECTED Final   Adenovirus F40/41 NOT DETECTED NOT DETECTED Final   Astrovirus NOT DETECTED NOT DETECTED Final   Norovirus GI/GII NOT DETECTED NOT DETECTED Final   Rotavirus A NOT DETECTED NOT DETECTED Final   Sapovirus (I, II, IV, and V) NOT DETECTED NOT DETECTED Final    Comment: Performed at [facility], [address] [place], [place] [address]  C Difficile Quick Screen w PCR reflex     Status: Abnormal   Collection Time: 06/29/19 12:08 AM   Specimen: Stool  Result Value Ref Range Status   C Diff antigen POSITIVE (A) NEGATIVE Final   C Diff toxin NEGATIVE NEGATIVE Final   C Diff interpretation Results are indeterminate. See PCR results.  Final    Comment: Performed at [facility], [address] [place], [place] [address]  Respiratory Panel by RT PCR (Flu A&B, Covid) - Nasopharyngeal Swab     Status: None   Collection Time: 06/29/19 12:08 AM   Specimen: Nasopharyngeal Swab  Result Value Ref Range Status   SARS Coronavirus 2 by RT PCR NEGATIVE NEGATIVE Final    Comment: (NOTE) SARS-CoV-2 target nucleic acids are NOT DETECTED. The SARS-CoV-2 RNA is generally detectable in upper respiratoy specimens during the acute phase of infection. The lowest concentration of SARS-CoV-2 viral copies this assay can detect is 131 copies/mL. A negative result does not preclude SARS-Cov-2 infection and should not be used as the sole basis for treatment or other patient management decisions. A negative result may occur with  improper specimen collection/handling, submission of specimen other than nasopharyngeal swab, presence of viral mutation(s) within the areas targeted by this assay, and inadequate number of viral copies (<131 copies/mL). A negative result must be combined with clinical observations, patient history, and epidemiological information.  The expected result is Negative. Fact Sheet for Patients:  PinkCheek.be Fact Sheet for Healthcare Providers:  GravelBags.it This test is not yet ap proved or cleared by the [place] FDA and  has been authorized for detection and/or diagnosis of SARS-CoV-2 by FDA under an Emergency Use Authorization (EUA). This EUA will remain  in effect (meaning this test can be used) for the duration of the COVID-19 declaration under Section 564(b)(1) of the Act, 21 U.S.C. section 360bbb-3(b)(1),  unless the authorization is terminated or revoked sooner.    Influenza A by PCR NEGATIVE NEGATIVE Final   Influenza B by PCR NEGATIVE NEGATIVE Final    Comment: (NOTE) The Xpert Xpress SARS-CoV-2/FLU/RSV assay is intended as an aid in  the diagnosis of influenza from Nasopharyngeal swab specimens and  should not be used as a sole basis for treatment. Nasal washings and  aspirates are unacceptable for Xpert Xpress SARS-CoV-2/FLU/RSV  testing. Fact Sheet for Patients: PinkCheek.be Fact Sheet for Healthcare Providers: GravelBags.it This test is not yet approved or cleared by the [place] FDA and  has been authorized for detection and/or diagnosis of SARS-CoV-2 by  FDA under an Emergency Use Authorization (EUA). This EUA will remain  in effect (meaning this test can be used) for the duration of the  Covid-19 declaration under Section 564(b)(1) of the Act, 21  U.S.C. section 360bbb-3(b)(1), unless the authorization is  terminated or revoked. Performed at [facility], [address], [place], [place] [address]   C. Diff by PCR, Reflexed     Status: Abnormal   Collection Time: 06/29/19 12:08 AM  Result Value Ref Range Status   Toxigenic C. Difficile by PCR POSITIVE (A) NEGATIVE Final    Comment: Positive for toxigenic C. difficile with little to no toxin production. Only  treat if clinical presentation suggests symptomatic illness. Performed at [facility], [address], [place], [place] [address]   Urine Culture     Status: Abnormal   Collection Time: 06/29/19 12:39 PM   Specimen: Urine, Catheterized  Result Value Ref Range Status   Specimen Description   Final    URINE, CATHETERIZED Performed at [facility], [address] [place], [place] [address]    Special Requests   Final    NONE Performed at [facility], [address] [place], [place] [address]    Culture (A)  Final    <10,000 COLONIES/mL INSIGNIFICANT GROWTH Performed at [facility], [place] [address] [place], [place] [address]    Report Status 06/30/2019 FINAL  Final  CULTURE, BLOOD (ROUTINE X 2) w Reflex to ID Panel     Status: None (Preliminary result)   Collection Time: 06/29/19  3:04 PM   Specimen: BLOOD  Result Value Ref Range Status   Specimen Description BLOOD BLOOD RIGHT HAND  Final   Special Requests   Final    BOTTLES DRAWN AEROBIC ONLY Blood Culture results may not be optimal due to an inadequate volume of blood received in culture bottles   Culture   Final    NO GROWTH 3 DAYS Performed at [facility], [address], [place], [place] [address]    Report Status PENDING  Incomplete  Culture, blood (Routine X 2) w Reflex to ID Panel     Status: None (Preliminary result)   Collection Time: 06/29/19 11:01 PM   Specimen: BLOOD  Result Value Ref Range Status   Specimen Description BLOOD BLOOD RIGHT HAND  Final   Special Requests   Final    BOTTLES DRAWN AEROBIC AND ANAEROBIC Blood Culture results may not be optimal due to an inadequate volume of blood received in culture bottles   Culture   Final    NO GROWTH 3 DAYS Performed at [facility], [address], [place], [place] [address]    Report Status PENDING  Incomplete         Radiology Studies: CT ABDOMEN PELVIS WO CONTRAST  Result Date:  07/01/2019 CLINICAL DATA:  Infectious gastroenteritis or colitis, salmonella, C difficile toxin positive, nausea, vomiting EXAM: CT ABDOMEN AND PELVIS WITHOUT CONTRAST TECHNIQUE: Multidetector CT imaging of the abdomen and pelvis was performed following the standard protocol without IV contrast. COMPARISON:  None. FINDINGS: Lower chest: Small left, trace right pleural effusions and associated atelectasis or consolidation. Three-vessel coronary artery calcifications and/or stents. Hepatobiliary: No solid liver abnormality is seen. Gallstones near the gallbladder neck. No gallbladder wall thickening. No biliary ductal dilatation Pancreas: Unremarkable. No pancreatic ductal dilatation or surrounding inflammatory changes. Spleen: Normal in size without significant abnormality. Adrenals/Urinary Tract: Adrenal glands are unremarkable. Atrophic appearing kidneys bilaterally. There is a 1.4 cm calculus near the left ureteropelvic junction without hydronephrosis. Additional nonobstructive calculi in the inferior pole of the left kidney. Bladder is unremarkable. Stomach/Bowel: Stomach is within normal limits. Appendix appears normal. The colon is fluid-filled throughout although nondilated, with mild fat stranding primarily noted about the transverse and proximal descending colon (series 2, image 46). Occasional sigmoid diverticula rectal tube. Vascular/Lymphatic: Aortic atherosclerosis. No enlarged abdominal or pelvic lymph nodes. Reproductive: No mass or other significant abnormality. Other: Fat containing umbilical hernia. Small volume fluid about the left paracolic gutter. Musculoskeletal: Status post extensive posterior thoracic and lumbar laminectomy and fusion with a probable seromatous fluid collection overlying the L4 through upper sacral levels (series 9, image 85). 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 IMPRESSION: 1. The colon is fluid-filled throughout although nondilated, with mild fat stranding primarily  noted about the transverse and proximal descending colon. Findings are generally in keeping with infectious or inflammatory colitis. Rectal tube is in position. 2. Small volume fluid in the left paracolic gutter, likely reactive. 3. There is a 1.4 cm calculus near the left ureteropelvic junction. No hydronephrosis. Additional nonobstructive calculi in the inferior pole of the left kidney. 4.  Cholelithiasis. 5. Bilateral pleural effusions and associated atelectasis or consolidation. Electronically Signed   By: Eddie Candle M.D.   On: 07/01/2019 13:17        Scheduled Meds: . amLODipine  10 mg Oral Daily  . apixaban  2.5 mg Oral Q12H  . aspirin EC  81 mg Oral Daily  . escitalopram  15 mg Oral Daily  . finasteride  5 mg Oral Daily  . fluticasone  2 spray Each Nare Daily  . gabapentin  400 mg Oral BID  . insulin aspart  0-15 Units Subcutaneous Q4H  . latanoprost  1 drop Both Eyes QHS  . pantoprazole (PROTONIX) IV  40 mg Intravenous QHS  . tamsulosin  0.4 mg Oral Daily  . vancomycin  500 mg Oral Q6H   Continuous Infusions: . sodium chloride    . sodium chloride 100 mL/hr at 07/01/19 2139  . cefTRIAXone (ROCEPHIN)  IV 2 g (07/02/19 0056)  . metronidazole 500 mg (07/02/19 0851)     LOS: 3 days    Time spent: 35 min    Sidney Ace, MD [facility] Pager [phone]  If 7PM-7AM, please contact night-coverage 07/02/2019, 1:34 PM

## 2019-07-02 NOTE — Progress Notes (Signed)
  ID Pt feeling better today Talking more Still bed for the past 3 days Loose stool in rectal foley Patient Vitals for the past 24 hrs:  BP Temp Temp src Pulse Resp SpO2  07/02/19 1727 (!) 187/59 99.4 F (37.4 C) Oral -- 20 96 %  07/02/19 0737 (!) 164/55 98.8 F (37.1 C) Axillary -- (!) 22 97 %  07/02/19 0101 -- -- -- -- (!) 24 --  07/02/19 0059 (!) 165/51 98.8 F (37.1 C) Oral -- (!) 27 96 %  07/02/19 0000 (!) 143/45 99.1 F (37.3 C) Oral -- (!) 26 96 %  07/01/19 2347 (!) 169/56 98.1 F (36.7 C) Oral 70 (!) 25 98 %  awake and alert- watching TV No distress Nasal cannula oxygen Chest b/l air entry Hs irregular Abd - soft- distended BS present  CBC Latest Ref Rng & Units 07/02/2019 07/01/2019 06/30/2019  WBC 4.0 - 10.5 K/uL 7.7 5.3 6.2  Hemoglobin 13.0 - 17.0 g/dL 12.2(L) 12.3(L) 12.5(L)  Hematocrit 39.0 - 52.0 % 36.6(L) 37.8(L) 39.0  Platelets 150 - 400 K/uL 194 170 188     CMP Latest Ref Rng & Units 07/02/2019 07/01/2019 06/30/2019  Glucose 70 - 99 mg/dL 168(H) 184(H) 146(H)  BUN 8 - 23 mg/dL 39(H) 50(H) 57(H)  Creatinine 0.61 - 1.24 mg/dL 2.37(H) 3.04(H) 3.56(H)  Sodium 135 - 145 mmol/L 138 136 136  Potassium 3.5 - 5.1 mmol/L 3.3(L) 3.4(L) 3.2(L)  Chloride 98 - 111 mmol/L 107 105 105  CO2 22 - 32 mmol/L 21(L) 21(L) 19(L)  Calcium 8.9 - 10.3 mg/dL 8.5(L) 8.4(L) 8.3(L)  Total Protein 6.5 - 8.1 g/dL - - 6.1(L)  Total Bilirubin 0.3 - 1.2 mg/dL - - 0.9  Alkaline Phos 38 - 126 U/L - - 35(L)  AST 15 - 41 U/L - - 20  ALT 0 - 44 U/L - - 15   Impression/recommendation Febrile illness with gastroenteritis- fever resolved   salmonella gastroenteritis This very likely is non typhoidal species-  Culture sent Not sure what put himat risk ? Food source  He ate sandwich ( fish) and meat from outside No bacteremia Continue IV ceftriaxone also confusing is that he has cdiffCdiff antigen positive , tox negative and PCR positive- no recent antibiotic or steroid use CT abdomen from  4/21  colon is fluid-filled throughout although nondilated, with mild fat stranding primarily noted about the transverse and proximal descending colon. Findings are generally in keeping with infectious or inflammatory colitis.  vanco increased  to $R'500mg'Gb$  PO q 6 and  added IV flagyl to treat like severe cdiff colitis Observe closely-   AKI on CKDimproving  Anemia- Hb 6 ( was 11)- received 2 units and Hb 12.5 FOB positive   Afib-   Hardware spine  CAD s/p CABG ? ?DM- on sliding scale  He needs to get out of bed- get PT  Discussed the management with patient

## 2019-07-02 NOTE — Progress Notes (Deleted)
ID Went to see patient for a consult Pt is annoyed that too many people are seeing him and there is no co-ordination. He did not want me to examine him Currently only 2 wound pictures are in [facility] ( sacrum and left hip area) He has multiple wounds and even has 2 wound vacs on his leg Need to get all the players at the same time to examine all his wound or take pictures of all the wounds thru [facility] so that everyone can see Need the wound care consultant present to evaluate all his wounds and remove the wound vac and assess the wounds. Primary team to arrange for it. Will do a full consult at that time and then give recommendations.

## 2019-07-02 NOTE — Care Management Important Message (Signed)
Important Message  Patient Details  Name: Derek Andersen MRN: [id number] Date of Birth: 07-Jul-1940   Medicare Important Message Given:  Yes  Gave to bedside RN due to pt in [place] room   Kalah Pflum, Gardiner Rhyme, LCSW 07/02/2019, 11:09 AM

## 2019-07-03 ENCOUNTER — Inpatient Hospital Stay: Payer: Medicare HMO

## 2019-07-03 DIAGNOSIS — D649 Anemia, unspecified: Secondary | ICD-10-CM | POA: Diagnosis not present

## 2019-07-03 LAB — BASIC METABOLIC PANEL
Anion gap: 10 (ref 5–15)
BUN: 28 mg/dL — ABNORMAL HIGH (ref 8–23)
CO2: 20 mmol/L — ABNORMAL LOW (ref 22–32)
Calcium: 8.7 mg/dL — ABNORMAL LOW (ref 8.9–10.3)
Chloride: 107 mmol/L (ref 98–111)
Creatinine, Ser: 1.93 mg/dL — ABNORMAL HIGH (ref 0.61–1.24)
GFR calc Af Amer: 38 mL/min — ABNORMAL LOW (ref >60)
GFR calc non Af Amer: 33 mL/min — ABNORMAL LOW (ref >60)
Glucose, Bld: 176 mg/dL — ABNORMAL HIGH (ref 70–99)
Potassium: 3.3 mmol/L — ABNORMAL LOW (ref 3.5–5.1)
Sodium: 137 mmol/L (ref 135–145)

## 2019-07-03 LAB — CBC WITH DIFFERENTIAL/PLATELET
Abs Immature Granulocytes: 0.12 10*3/uL — ABNORMAL HIGH (ref 0.00–0.07)
Basophils Absolute: 0.1 10*3/uL (ref 0.0–0.1)
Basophils Relative: 1 %
Eosinophils Absolute: 0.1 10*3/uL (ref 0.0–0.5)
Eosinophils Relative: 1 %
HCT: 38.1 % — ABNORMAL LOW (ref 39.0–52.0)
Hemoglobin: 12.6 g/dL — ABNORMAL LOW (ref 13.0–17.0)
Immature Granulocytes: 1 %
Lymphocytes Relative: 9 %
Lymphs Abs: 0.8 10*3/uL (ref 0.7–4.0)
MCH: 30.1 pg (ref 26.0–34.0)
MCHC: 33.1 g/dL (ref 30.0–36.0)
MCV: 90.9 fL (ref 80.0–100.0)
Monocytes Absolute: 1 10*3/uL (ref 0.1–1.0)
Monocytes Relative: 10 %
Neutro Abs: 7.3 10*3/uL (ref 1.7–7.7)
Neutrophils Relative %: 78 %
Platelets: 183 10*3/uL (ref 150–400)
RBC: 4.19 MIL/uL — ABNORMAL LOW (ref 4.22–5.81)
RDW: 13.6 % (ref 11.5–15.5)
WBC: 9.4 10*3/uL (ref 4.0–10.5)
nRBC: 0 % (ref 0.0–0.2)

## 2019-07-03 LAB — GLUCOSE, CAPILLARY
Glucose-Capillary: 156 mg/dL — ABNORMAL HIGH (ref 70–99)
Glucose-Capillary: 156 mg/dL — ABNORMAL HIGH (ref 70–99)
Glucose-Capillary: 207 mg/dL — ABNORMAL HIGH (ref 70–99)
Glucose-Capillary: 209 mg/dL — ABNORMAL HIGH (ref 70–99)
Glucose-Capillary: 213 mg/dL — ABNORMAL HIGH (ref 70–99)
Glucose-Capillary: 311 mg/dL — ABNORMAL HIGH (ref 70–99)
Glucose-Capillary: 325 mg/dL — ABNORMAL HIGH (ref 70–99)

## 2019-07-03 MED ORDER — IPRATROPIUM-ALBUTEROL 0.5-2.5 (3) MG/3ML IN SOLN: 3.0000 mL | RESPIRATORY_TRACT | Status: DC | PRN

## 2019-07-03 MED ORDER — SODIUM CHLORIDE 0.9 % IV SOLN
INTRAVENOUS | Status: DC | PRN
  Administered 2019-07-03: 1000 mL via INTRAVENOUS
  Administered 2019-07-06: 500 mL via INTRAVENOUS
  Administered 2019-07-07: 01:00:00 50 mL via INTRAVENOUS

## 2019-07-03 MED ORDER — FUROSEMIDE 10 MG/ML IJ SOLN
40.0000 mg | Freq: Once | INTRAMUSCULAR | Status: AC
  Administered 2019-07-03: 40 mg via INTRAVENOUS
  Filled 2019-07-03: qty 4

## 2019-07-03 MED ORDER — INSULIN GLARGINE 100 UNIT/ML ~~LOC~~ SOLN
20.0000 [IU] | Freq: Every day | SUBCUTANEOUS | Status: DC
  Administered 2019-07-03 – 2019-07-06 (×4): 20 [IU] via SUBCUTANEOUS
  Filled 2019-07-03 (×5): qty 0.2

## 2019-07-03 MED ORDER — INSULIN GLARGINE 100 UNIT/ML ~~LOC~~ SOLN: 20.0000 [IU] | Freq: Every day | SUBCUTANEOUS | Status: DC

## 2019-07-03 NOTE — Progress Notes (Deleted)
   07/03/19 1436  Vitals  Temp 99.6 F (37.6 C)  Temp Source Oral  BP (!) 170/60  MAP (mmHg) 100  BP Location Left Arm  BP Method Automatic  Pulse Rate 88  Pulse Rate Source Monitor  ECG Heart Rate 89  Resp (!) 31  Level of Consciousness  Level of Consciousness Alert  Oxygen Therapy  SpO2 99 %  O2 Device Nasal Cannula  O2 Flow Rate (L/min) 3 L/min  MEWS Score  MEWS Temp 0  MEWS Systolic 0  MEWS Pulse 0  MEWS RR 2  MEWS LOC 0  MEWS Score 2  MEWS Score Color Yellow  Note  Observations  (Not an acute change. No new orders at this time. )

## 2019-07-03 NOTE — Progress Notes (Signed)
Patient had audible expiratory wheezing this am. Night shift RN held IV fluids. Received order from attending physician for 40 mg furosemide which was administered. Will continue to monitor.

## 2019-07-03 NOTE — Progress Notes (Signed)
   07/03/19 2127  Assess: MEWS Score  Temp 98.8 F (37.1 C)  BP (!) 164/54  Pulse Rate 62  ECG Heart Rate 61  Resp 19  SpO2 93 %  O2 Device Nasal Cannula  O2 Flow Rate (L/min) 3 L/min  Assess: MEWS Score  MEWS Temp 0  MEWS Systolic 0  MEWS Pulse 0  MEWS RR 0  MEWS LOC 0  MEWS Score 0  MEWS Score Color Green  Assess: if the MEWS score is Yellow or Red  Were vital signs taken at a resting state? Yes  Focused Assessment Documented focused assessment  Early Detection of Sepsis Score *See Row Information* Low  MEWS guidelines implemented *See Row Information* Yes  Treat  MEWS Interventions Other (Comment) (implemented yellow guidelines from previous yellow MEWS)  Take Vital Signs  Increase Vital Sign Frequency  Yellow: Q 2hr X 2 then Q 4hr X 2, if remains yellow, continue Q 4hrs  Escalate  MEWS: Escalate Yellow: discuss with charge nurse/RN and consider discussing with provider and RRT  Notify: Charge Nurse/RN  Name of Charge Nurse/RN Notified Siri Cole RN  Date Charge Nurse/RN Notified 07/03/19  Time Charge Nurse/RN Notified 2218  Document  Patient Outcome Stabilized after interventions  Progress note created (see row info) Yes  Yellow MEWS guidelines initiated after prior Yellow MEWS guidelines were not initiated

## 2019-07-03 NOTE — Progress Notes (Signed)
PT Cancellation Note  Patient Details Name: Derek Andersen MRN: [id number] DOB: September 04, 1940   Cancelled Treatment:    Reason Eval/Treat Not Completed: Medical issues which prohibited therapy; Nsg reports that he had a difficulty day, and that he would probably do better tomorrow.    [address], [place], [place] DPT 07/03/2019, 1:20 PM

## 2019-07-03 NOTE — Progress Notes (Signed)
PROGRESS NOTE    Derek Andersen  CLQ:[id number] DOB: 12/21/1940 DOA: 06/28/2019 PCP: Kandyce Rud, MD   Brief Narrative:  HPI: Derek Andersen is a 79 y.o. male with medical history significant for A. fib on Eliquis, CAD, CKD 4, DM, anemia of CKD, BPH and OSA who presents to the emergency room with a 3-day history of diarrhea and vomiting associated with weakness.  He denied fever, chills or abdominal pain.  Denies chest pain or shortness of breath.  His vomit is yellow gastric contents, nonbloody nonbilious and no coffee grounds.  Diarrhea is watery and yellow, no blood in the stool no black stool. No affected contacts.  4/21: Patient seen and examined.  Still significantly weakened and deconditioned.  Rectal tube remains in place.  Hemoglobin stable over interval.  4/22: Patient seen and examined.  Starting to improve.  Rectal tube remains in place.  Hemoglobin stable over interval.  No bleeding noted.  No fevers over interval.  4/23: Patient seen and examined.  Rectal tube in place.  Very loose stools persistent.  Discussed with ID.  Vancomycin increased to 500 mg p.o. every 6 hours.  Continues on IV Rocephin.  4/24: Patient seen and examined.  Rectal tube remains in place.  Output decreasing.  Feeling better today.  Vancomycin continuing at 500mg , Flagyl added per ID yesterday.  Treat as if severe C. difficile colitis.  Some wheezing and shortness of breath noted on exam this morning.  IV fluids held.   Assessment & Plan:   Principal Problem:   Symptomatic anemia Active Problems:   Acute gastroenteritis: C diff and Salmonella positive    Suspect GI bleed   Acute renal failure superimposed on stage 4 chronic kidney disease (HCC)   Chronic anticoagulation   Atrial fibrillation, chronic (HCC)   CAD (coronary artery disease)   Type 2 diabetes mellitus with hyperlipidemia (HCC)   Clostridium difficile enteritis   Intestinal infection or food poisoning due to salmonella  bacteria   Infectious colitis   C. difficile colitis  Acute Salmonella gastroenteritis C. difficile toxin positive antigen negative 3 days of nausea vomiting Unclear trigger, possibly chicken salad versus fish sandwich Infectious disease consulted, started on oral Vanco and Rocephin CT abd- fluid filled colon, not distended, stranding consistent with colitis Plan: Continue vancomycin p.o. 500 q6h Flagyl 500 mg 3 times daily added per ID Continue IV Rocephin Continue rectal tube for now, consider DC in morning if still volume decreasing  Symptomatic anemia Possible GI bleed Stool guaiac positive Transfuse 2 units in ED GI consulted, no plans for scope, FOBT secondary to acute infectious colitis Plan: Eliquis restarted IV PPI Treat infection as above  Acute renal failure superimposed on stage 4 chronic kidney disease (HCC) -IV hydration and monitor renal function -Consult nephrology if worsening Improving over interval    Chronic anticoagulation   Atrial fibrillation, chronic (HCC) -Patient on Eliquis for A. fib -Restarted 4/22, no bleeding noted    CAD (coronary artery disease) -No chest pain -Eliquis restarted - Continue beta-blockers and statins  Type 2 diabetes mellitus with hyperlipidemia (HCC) -Insulin sliding scale coverage   DVT prophylaxis: Eliquis Code Status: Full code Family Communication: None today, offered to call, patient declined Disposition Plan: Status is: Inpatient  Remains inpatient appropriate because:Inpatient level of care appropriate due to severity of illness   Dispo: The patient is from: Home              Anticipated d/c is to: Home  Anticipated d/c date is: 2 days              Patient currently is not medically stable to d/c.  Still with symptoms of infectious colitis.  Rectal tube remains in place.  Still with diarrhea.  Wheezing this morning with reduction in oxygen saturation.  not at treatment  goal.      Consultants:   GI  ID  Procedures:   none  Antimicrobials:  Rocephin  PO vancomycin    Subjective: Seen and examined Endorses some improvement in symptoms No fevers noted over interval  Objective: Vitals:   07/03/19 0111 07/03/19 1034 07/03/19 1235 07/03/19 1300  BP:  (!) 183/70  (!) 165/57  Pulse: 64 79 73   Resp: (!) 21 (!) 23 (!) 25   Temp:  98.5 F (36.9 C)  98.7 F (37.1 C)  TempSrc:  Oral  Oral  SpO2:  97% 97%   Weight:      Height:        Intake/Output Summary (Last 24 hours) at 07/03/2019 1408 Last data filed at 07/03/2019 0644 Gross per 24 hour  Intake 2046.23 ml  Output 2050 ml  Net -3.77 ml   Filed Weights   06/28/19 1959  Weight: 103.9 kg    Examination:  General exam: Appears calm and comfortable  Respiratory system: Clear to auscultation. Respiratory effort normal. Cardiovascular system: S1 & S2 heard, RRR. No JVD, murmurs, rubs, gallops or clicks. No pedal edema. Gastrointestinal system: Hyperactive bowel sounds, rectal tube in place Central nervous system: Alert and oriented. No focal neurological deficits. Extremities: Symmetric 5 x 5 power. Skin: No rashes, lesions or ulcers Psychiatry: Judgement and insight appear normal. Mood & affect appropriate.     Data Reviewed: I have personally reviewed following labs and imaging studies  CBC: Recent Labs  Lab 06/29/19 1239 06/30/19 0539 07/01/19 0756 07/02/19 0632 07/03/19 0537  WBC 7.6 6.2 5.3 7.7 9.4  NEUTROABS  --   --  3.9 5.8 7.3  HGB 13.7 12.5* 12.3* 12.2* 12.6*  HCT 42.8 39.0 37.8* 36.6* 38.1*  MCV 93.7 92.9 91.3 89.7 90.9  PLT 213 188 170 194 183   Basic Metabolic Panel: Recent Labs  Lab 06/29/19 1239 06/30/19 0539 07/01/19 0756 07/02/19 0632 07/03/19 0537  NA 138 136 136 138 137  K 3.9 3.2* 3.4* 3.3* 3.3*  CL 104 105 105 107 107  CO2 22 19* 21* 21* 20*  GLUCOSE 174* 146* 184* 168* 176*  BUN 55* 57* 50* 39* 28*  CREATININE 3.44* 3.56* 3.04*  2.37* 1.93*  CALCIUM 8.7* 8.3* 8.4* 8.5* 8.7*   GFR: Estimated Creatinine Clearance: 36.8 mL/min (A) (by C-G formula based on SCr of 1.93 mg/dL (H)). Liver Function Tests: Recent Labs  Lab 06/28/19 2002 06/30/19 0539  AST 18 20  ALT 13 15  ALKPHOS 49 35*  BILITOT 1.0 0.9  PROT 7.7 6.1*  ALBUMIN 3.8 2.8*   Recent Labs  Lab 06/28/19 2002  LIPASE 24   No results for input(s): AMMONIA in the last 168 hours. Coagulation Profile: Recent Labs  Lab 06/28/19 2304  INR 1.1   Cardiac Enzymes: No results for input(s): CKTOTAL, CKMB, CKMBINDEX, TROPONINI in the last 168 hours. BNP (last 3 results) No results for input(s): PROBNP in the last 8760 hours. HbA1C: No results for input(s): HGBA1C in the last 72 hours. CBG: Recent Labs  Lab 07/02/19 2111 07/03/19 0017 07/03/19 0417 07/03/19 0802 07/03/19 1206  GLUCAP 321* 207* 156* 156* 213*  Lipid Profile: No results for input(s): CHOL, HDL, LDLCALC, TRIG, CHOLHDL, LDLDIRECT in the last 72 hours. Thyroid Function Tests: No results for input(s): TSH, T4TOTAL, FREET4, T3FREE, THYROIDAB in the last 72 hours. Anemia Panel: No results for input(s): VITAMINB12, FOLATE, FERRITIN, TIBC, IRON, RETICCTPCT in the last 72 hours. Sepsis Labs: Recent Labs  Lab 06/28/19 2304  LATICACIDVEN 0.4*    Recent Results (from the past 240 hour(s))  Gastrointestinal Panel by PCR , Stool     Status: Abnormal   Collection Time: 06/29/19 12:08 AM   Specimen: Stool  Result Value Ref Range Status   Campylobacter species NOT DETECTED NOT DETECTED Final   Plesimonas shigelloides NOT DETECTED NOT DETECTED Final   Salmonella species DETECTED (A) NOT DETECTED Final    Comment: RESULT CALLED TO, READ BACK BY AND VERIFIED WITH: Adelene Idler RN 0142 06/29/19 HNM    Yersinia enterocolitica NOT DETECTED NOT DETECTED Final   Vibrio species NOT DETECTED NOT DETECTED Final   Vibrio cholerae NOT DETECTED NOT DETECTED Final   Enteroaggregative E coli (EAEC)  NOT DETECTED NOT DETECTED Final   Enteropathogenic E coli (EPEC) NOT DETECTED NOT DETECTED Final   Enterotoxigenic E coli (ETEC) NOT DETECTED NOT DETECTED Final   Shiga like toxin producing E coli (STEC) NOT DETECTED NOT DETECTED Final   Shigella/Enteroinvasive E coli (EIEC) NOT DETECTED NOT DETECTED Final   Cryptosporidium NOT DETECTED NOT DETECTED Final   Cyclospora cayetanensis NOT DETECTED NOT DETECTED Final   Entamoeba histolytica NOT DETECTED NOT DETECTED Final   Giardia lamblia NOT DETECTED NOT DETECTED Final   Adenovirus F40/41 NOT DETECTED NOT DETECTED Final   Astrovirus NOT DETECTED NOT DETECTED Final   Norovirus GI/GII NOT DETECTED NOT DETECTED Final   Rotavirus A NOT DETECTED NOT DETECTED Final   Sapovirus (I, II, IV, and V) NOT DETECTED NOT DETECTED Final    Comment: Performed at [facility], [address] [place], [place] [address]  C Difficile Quick Screen w PCR reflex     Status: Abnormal   Collection Time: 06/29/19 12:08 AM   Specimen: Stool  Result Value Ref Range Status   C Diff antigen POSITIVE (A) NEGATIVE Final   C Diff toxin NEGATIVE NEGATIVE Final   C Diff interpretation Results are indeterminate. See PCR results.  Final    Comment: Performed at [facility], [address] [place], [place] [address]  Respiratory Panel by RT PCR (Flu A&B, Covid) - Nasopharyngeal Swab     Status: None   Collection Time: 06/29/19 12:08 AM   Specimen: Nasopharyngeal Swab  Result Value Ref Range Status   SARS Coronavirus 2 by RT PCR NEGATIVE NEGATIVE Final    Comment: (NOTE) SARS-CoV-2 target nucleic acids are NOT DETECTED. The SARS-CoV-2 RNA is generally detectable in upper respiratoy specimens during the acute phase of infection. The lowest concentration of SARS-CoV-2 viral copies this assay can detect is 131 copies/mL. A negative result does not preclude SARS-Cov-2 infection and should not be used as the sole basis for treatment or other patient  management decisions. A negative result may occur with  improper specimen collection/handling, submission of specimen other than nasopharyngeal swab, presence of viral mutation(s) within the areas targeted by this assay, and inadequate number of viral copies (<131 copies/mL). A negative result must be combined with clinical observations, patient history, and epidemiological information. The expected result is Negative. Fact Sheet for Patients:  PinkCheek.be Fact Sheet for Healthcare Providers:  GravelBags.it This test is not yet ap proved  or cleared by the [place] and  has been authorized for detection and/or diagnosis of SARS-CoV-2 by FDA under an Emergency Use Authorization (EUA). This EUA will remain  in effect (meaning this test can be used) for the duration of the COVID-19 declaration under Section 564(b)(1) of the Act, 21 U.S.C. section 360bbb-3(b)(1), unless the authorization is terminated or revoked sooner.    Influenza A by PCR NEGATIVE NEGATIVE Final   Influenza B by PCR NEGATIVE NEGATIVE Final    Comment: (NOTE) The Xpert Xpress SARS-CoV-2/FLU/RSV assay is intended as an aid in  the diagnosis of influenza from Nasopharyngeal swab specimens and  should not be used as a sole basis for treatment. Nasal washings and  aspirates are unacceptable for Xpert Xpress SARS-CoV-2/FLU/RSV  testing. Fact Sheet for Patients: PinkCheek.be Fact Sheet for Healthcare Providers: GravelBags.it This test is not yet approved or cleared by the [place] FDA and  has been authorized for detection and/or diagnosis of SARS-CoV-2 by  FDA under an Emergency Use Authorization (EUA). This EUA will remain  in effect (meaning this test can be used) for the duration of the  Covid-19 declaration under Section 564(b)(1) of the Act, 21  U.S.C. section 360bbb-3(b)(1), unless the  authorization is  terminated or revoked. Performed at [facility], [address], [place], [place] [address]   C. Diff by PCR, Reflexed     Status: Abnormal   Collection Time: 06/29/19 12:08 AM  Result Value Ref Range Status   Toxigenic C. Difficile by PCR POSITIVE (A) NEGATIVE Final    Comment: Positive for toxigenic C. difficile with little to no toxin production. Only treat if clinical presentation suggests symptomatic illness. Performed at [facility], [address] [place], [place] [address]   Urine Culture     Status: Abnormal   Collection Time: 06/29/19 12:39 PM   Specimen: Urine, Catheterized  Result Value Ref Range Status   Specimen Description   Final    URINE, CATHETERIZED Performed at [facility], [address] [place], [place] [address]    Special Requests   Final    NONE Performed at [facility], [address] [place], [place] [address]    Culture (A)  Final    <10,000 COLONIES/mL INSIGNIFICANT GROWTH Performed at [facility], [place] [address] [place], [place] [address]    Report Status 06/30/2019 FINAL  Final  CULTURE, BLOOD (ROUTINE X 2) w Reflex to ID Panel     Status: None (Preliminary result)   Collection Time: 06/29/19  3:04 PM   Specimen: BLOOD  Result Value Ref Range Status   Specimen Description BLOOD BLOOD RIGHT HAND  Final   Special Requests   Final    BOTTLES DRAWN AEROBIC ONLY Blood Culture results may not be optimal due to an inadequate volume of blood received in culture bottles   Culture   Final    NO GROWTH 4 DAYS Performed at [facility], [address], [place], [place] [address]    Report Status PENDING  Incomplete  Culture, blood (Routine X 2) w Reflex to ID Panel     Status: None (Preliminary result)   Collection Time: 06/29/19 11:01 PM   Specimen: BLOOD  Result Value Ref Range Status   Specimen Description BLOOD BLOOD RIGHT HAND  Final   Special Requests   Final     BOTTLES DRAWN AEROBIC AND ANAEROBIC Blood Culture results may not be optimal due to an inadequate volume  of blood received in culture bottles   Culture   Final    NO GROWTH 4 DAYS Performed at [facility], [address], [place], [place] [address]    Report Status PENDING  Incomplete         Radiology Studies: DG Chest Port 1 View  Result Date: 07/03/2019 CLINICAL DATA:  Diarrhea and vomiting.  Difficulty breathing. EXAM: PORTABLE CHEST 1 VIEW COMPARISON:  June 29, 2019 FINDINGS: Stable left retrocardiac opacity. The cardiomediastinal silhouette is stable. No pneumothorax. No nodules or masses. Mild pulmonary venous congestion not excluded. No overt edema. IMPRESSION: 1. Stable left retrocardiac opacity. Mild pulmonary venous congestion. No other acute abnormalities. Electronically Signed   By: Dorise Bullion III M.D   On: 07/03/2019 13:06        Scheduled Meds: . amLODipine  10 mg Oral Daily  . apixaban  2.5 mg Oral Q12H  . aspirin EC  81 mg Oral Daily  . escitalopram  15 mg Oral Daily  . finasteride  5 mg Oral Daily  . fluticasone  2 spray Each Nare Daily  . gabapentin  400 mg Oral BID  . insulin aspart  0-15 Units Subcutaneous Q4H  . latanoprost  1 drop Both Eyes QHS  . pantoprazole (PROTONIX) IV  40 mg Intravenous QHS  . tamsulosin  0.4 mg Oral Daily  . vancomycin  500 mg Oral Q6H   Continuous Infusions: . sodium chloride    . cefTRIAXone (ROCEPHIN)  IV 2 g (07/03/19 0051)  . metronidazole 500 mg (07/03/19 1059)     LOS: 4 days    Time spent: 35 min    Sidney Ace, MD [facility] Pager [phone]  If 7PM-7AM, please contact night-coverage 07/03/2019, 2:08 PM

## 2019-07-03 NOTE — Progress Notes (Addendum)
   07/03/19 1436  Vitals  Temp 99.6 F (37.6 C)  Temp Source Oral  BP (!) 170/60  MAP (mmHg) 100  BP Location Left Arm  BP Method Automatic  Pulse Rate 88  Pulse Rate Source Monitor  ECG Heart Rate 89  Resp (!) 31  Level of Consciousness  Level of Consciousness Alert  Oxygen Therapy  SpO2 99 %  O2 Device Nasal Cannula  O2 Flow Rate (L/min) 3 L/min  MEWS Score  MEWS Temp 0  MEWS Systolic 0  MEWS Pulse 0  MEWS RR 2  MEWS LOC 0  MEWS Score 2  MEWS Score Color Yellow  Not an acute change. MD aware. No new orders at this time. Will continue to monitor.

## 2019-07-04 DIAGNOSIS — D649 Anemia, unspecified: Secondary | ICD-10-CM | POA: Diagnosis not present

## 2019-07-04 LAB — CULTURE, BLOOD (ROUTINE X 2)
Culture: NO GROWTH
Culture: NO GROWTH

## 2019-07-04 LAB — BASIC METABOLIC PANEL
Anion gap: 9 (ref 5–15)
BUN: 29 mg/dL — ABNORMAL HIGH (ref 8–23)
CO2: 26 mmol/L (ref 22–32)
Calcium: 8.8 mg/dL — ABNORMAL LOW (ref 8.9–10.3)
Chloride: 105 mmol/L (ref 98–111)
Creatinine, Ser: 1.82 mg/dL — ABNORMAL HIGH (ref 0.61–1.24)
GFR calc Af Amer: 41 mL/min — ABNORMAL LOW (ref >60)
GFR calc non Af Amer: 35 mL/min — ABNORMAL LOW (ref >60)
Glucose, Bld: 158 mg/dL — ABNORMAL HIGH (ref 70–99)
Potassium: 2.9 mmol/L — ABNORMAL LOW (ref 3.5–5.1)
Sodium: 140 mmol/L (ref 135–145)

## 2019-07-04 LAB — GLUCOSE, CAPILLARY
Glucose-Capillary: 136 mg/dL — ABNORMAL HIGH (ref 70–99)
Glucose-Capillary: 160 mg/dL — ABNORMAL HIGH (ref 70–99)
Glucose-Capillary: 255 mg/dL — ABNORMAL HIGH (ref 70–99)
Glucose-Capillary: 326 mg/dL — ABNORMAL HIGH (ref 70–99)
Glucose-Capillary: 384 mg/dL — ABNORMAL HIGH (ref 70–99)
Glucose-Capillary: 243 mg/dL — ABNORMAL HIGH (ref 70–99)

## 2019-07-04 MED ORDER — POTASSIUM CHLORIDE CRYS ER 20 MEQ PO TBCR
60.0000 meq | EXTENDED_RELEASE_TABLET | Freq: Once | ORAL | Status: AC
  Administered 2019-07-04: 60 meq via ORAL
  Filled 2019-07-04: qty 3

## 2019-07-04 MED ORDER — HYDRALAZINE HCL 20 MG/ML IJ SOLN: 10.0000 mg | Freq: Four times a day (QID) | INTRAMUSCULAR | Status: DC | PRN

## 2019-07-04 NOTE — Progress Notes (Signed)
PT Cancellation Note  Patient Details Name: Derek Andersen MRN: [id number] DOB: May 26, 1940   Cancelled Treatment:    Reason Eval/Treat Not Completed: Medical issues which prohibited therapy. PT checked with RN and patient is not able to participate in PT eval today .    [address] [place] DPT 07/04/2019, 9:18 AM

## 2019-07-04 NOTE — Progress Notes (Signed)
OT Cancellation Note  Patient Details Name: Derek Andersen MRN: [id number] DOB: 1940-07-10   Cancelled Treatment:    Reason Eval/Treat Not Completed: Patient not medically ready  OT consult received and chart reviewed. Pt has been tachypneac on/off today per RN. Will f/u tomorrow for OT evaluation. Thank you.  Gerrianne Scale, Bee, OTR/L ascom [phone] 07/04/19, 4:31 PM

## 2019-07-04 NOTE — Progress Notes (Signed)
PROGRESS NOTE    Derek Andersen  ZOX:[id number] DOB: 14-Apr-1940 DOA: 06/28/2019 PCP: Derinda Late, MD   Brief Narrative:  HPI: Derek Andersen is a 79 y.o. male with medical history significant for A. fib on Eliquis, CAD, CKD 4, DM, anemia of CKD, BPH and OSA who presents to the emergency room with a 3-day history of diarrhea and vomiting associated with weakness.  He denied fever, chills or abdominal pain.  Denies chest pain or shortness of breath.  His vomit is yellow gastric contents, nonbloody nonbilious and no coffee grounds.  Diarrhea is watery and yellow, no blood in the stool no black stool. No affected contacts.  4/21: Patient seen and examined.  Still significantly weakened and deconditioned.  Rectal tube remains in place.  Hemoglobin stable over interval.  4/22: Patient seen and examined.  Starting to improve.  Rectal tube remains in place.  Hemoglobin stable over interval.  No bleeding noted.  No fevers over interval.  4/23: Patient seen and examined.  Rectal tube in place.  Very loose stools persistent.  Discussed with ID.  Vancomycin increased to 500 mg p.o. every 6 hours.  Continues on IV Rocephin.  4/24: Patient seen and examined.  Rectal tube remains in place.  Output decreasing.  Feeling better today.  Vancomycin continuing at $RemoveBefor'500mg'ZAwSwwubrjLY$ , Flagyl added per ID yesterday.  Treat as if severe C. difficile colitis.  Some wheezing and shortness of breath noted on exam this morning.  IV fluids held.  4/25: Patient seen and examined.  Rectal tube remains in place.  Output decreasing.  Status about stable from yesterday.  Vancomycin continuing, Flagyl continuing, Rocephin continuing.  Oxygen status improved.  Currently on 2 to 3 L nasal cannula.  No wheezing noted.   Assessment & Plan:   Principal Problem:   Symptomatic anemia Active Problems:   Acute gastroenteritis: C diff and Salmonella positive    Suspect GI bleed   Acute renal failure superimposed on stage 4 chronic  kidney disease (HCC)   Chronic anticoagulation   Atrial fibrillation, chronic (HCC)   CAD (coronary artery disease)   Type 2 diabetes mellitus with hyperlipidemia (HCC)   Clostridium difficile enteritis   Intestinal infection or food poisoning due to salmonella bacteria   Infectious colitis   C. difficile colitis  Acute Salmonella gastroenteritis C. difficile toxin positive antigen negative 3 days of nausea vomiting Unclear trigger, possibly chicken salad versus fish sandwich Infectious disease consulted, started on oral Vanco and Rocephin CT abd- fluid filled colon, not distended, stranding consistent with colitis Plan: Continue vancomycin p.o. 500 q6h Flagyl 500 mg 3 times daily added per ID Continue IV Rocephin Continue rectal tube for now, consider DC in morning if still volume decreasing If patient does not respond to therapy within 24hours we will repeat CAT scan to rule out perforation/abscess. Mobilize as tolerated.  Out of bed to chair.  Symptomatic anemia Possible GI bleed Stool guaiac positive Transfuse 2 units in ED GI consulted, no plans for scope, FOBT secondary to acute infectious colitis Plan: Eliquis restarted IV PPI Treat infection as above  Acute renal failure superimposed on stage 4 chronic kidney disease ([place]) -IV hydration and monitor renal function -Consult nephrology if worsening Improving over interval    Chronic anticoagulation   Atrial fibrillation, chronic (HCC) -Patient on Eliquis for A. fib -Restarted 4/22, no bleeding noted    CAD (coronary artery disease) -No chest pain -Eliquis restarted - Continue beta-blockers and statins  Type 2 diabetes mellitus with  hyperlipidemia (HCC) -Insulin sliding scale coverage   DVT prophylaxis: Eliquis Code Status: Full code Family Communication: None today, offered to call, patient declined Disposition Plan: Status is: Inpatient  Remains inpatient appropriate because:Inpatient level of care  appropriate due to severity of illness   Dispo: The patient is from: Home              Anticipated d/c is to: Home              Anticipated d/c date is: 2 days              Patient currently is not medically stable to d/c.  Still with symptoms of infectious colitis.  Rectal tube remains in place.  Still with diarrhea.  Wheezing this morning with reduction in oxygen saturation.  not at treatment goal.      Consultants:   GI  ID  Procedures:   none  Antimicrobials:  Rocephin  PO vancomycin    Subjective: Seen and examined Endorses some improvement in symptoms No fevers noted over interval  Objective: Vitals:   07/04/19 0925 07/04/19 0926 07/04/19 0927 07/04/19 0928  BP:    (!) 180/63  Pulse: 67 67 66 70  Resp: (!) 27 (!) 25 (!) 26 (!) 26  Temp:    98.4 F (36.9 C)  TempSrc:    Oral  SpO2: 97% 96% 96% 96%  Weight:      Height:        Intake/Output Summary (Last 24 hours) at 07/04/2019 1304 Last data filed at 07/04/2019 0342 Gross per 24 hour  Intake 716.02 ml  Output 1700 ml  Net -983.98 ml   Filed Weights   06/28/19 1959  Weight: 103.9 kg    Examination:  General exam: Appears calm and comfortable  Respiratory system: Clear to auscultation. Respiratory effort normal. Cardiovascular system: S1 & S2 heard, RRR. No JVD, murmurs, rubs, gallops or clicks. No pedal edema. Gastrointestinal system: Hyperactive bowel sounds, rectal tube in place Central nervous system: Alert and oriented. No focal neurological deficits. Extremities: Symmetric 5 x 5 power. Skin: No rashes, lesions or ulcers Psychiatry: Judgement and insight appear normal. Mood & affect appropriate.     Data Reviewed: I have personally reviewed following labs and imaging studies  CBC: Recent Labs  Lab 06/29/19 1239 06/30/19 0539 07/01/19 0756 07/02/19 0632 07/03/19 0537  WBC 7.6 6.2 5.3 7.7 9.4  NEUTROABS  --   --  3.9 5.8 7.3  HGB 13.7 12.5* 12.3* 12.2* 12.6*  HCT 42.8 39.0  37.8* 36.6* 38.1*  MCV 93.7 92.9 91.3 89.7 90.9  PLT 213 188 170 194 616   Basic Metabolic Panel: Recent Labs  Lab 06/30/19 0539 07/01/19 0756 07/02/19 0632 07/03/19 0537 07/04/19 0458  NA 136 136 138 137 140  K 3.2* 3.4* 3.3* 3.3* 2.9*  CL 105 105 107 107 105  CO2 19* 21* 21* 20* 26  GLUCOSE 146* 184* 168* 176* 158*  BUN 57* 50* 39* 28* 29*  CREATININE 3.56* 3.04* 2.37* 1.93* 1.82*  CALCIUM 8.3* 8.4* 8.5* 8.7* 8.8*   GFR: Estimated Creatinine Clearance: 39 mL/min (A) (by C-G formula based on SCr of 1.82 mg/dL (H)). Liver Function Tests: Recent Labs  Lab 06/28/19 2002 06/30/19 0539  AST 18 20  ALT 13 15  ALKPHOS 49 35*  BILITOT 1.0 0.9  PROT 7.7 6.1*  ALBUMIN 3.8 2.8*   Recent Labs  Lab 06/28/19 2002  LIPASE 24   No results for input(s): AMMONIA in the  last 168 hours. Coagulation Profile: Recent Labs  Lab 06/28/19 2304  INR 1.1   Cardiac Enzymes: No results for input(s): CKTOTAL, CKMB, CKMBINDEX, TROPONINI in the last 168 hours. BNP (last 3 results) No results for input(s): PROBNP in the last 8760 hours. HbA1C: No results for input(s): HGBA1C in the last 72 hours. CBG: Recent Labs  Lab 07/03/19 2004 07/03/19 2356 07/04/19 0340 07/04/19 0845 07/04/19 1203  GLUCAP 325* 209* 136* 160* 255*   Lipid Profile: No results for input(s): CHOL, HDL, LDLCALC, TRIG, CHOLHDL, LDLDIRECT in the last 72 hours. Thyroid Function Tests: No results for input(s): TSH, T4TOTAL, FREET4, T3FREE, THYROIDAB in the last 72 hours. Anemia Panel: No results for input(s): VITAMINB12, FOLATE, FERRITIN, TIBC, IRON, RETICCTPCT in the last 72 hours. Sepsis Labs: Recent Labs  Lab 06/28/19 2304  LATICACIDVEN 0.4*    Recent Results (from the past 240 hour(s))  Gastrointestinal Panel by PCR , Stool     Status: Abnormal   Collection Time: 06/29/19 12:08 AM   Specimen: Stool  Result Value Ref Range Status   Campylobacter species NOT DETECTED NOT DETECTED Final   Plesimonas  shigelloides NOT DETECTED NOT DETECTED Final   Salmonella species DETECTED (A) NOT DETECTED Final    Comment: RESULT CALLED TO, READ BACK BY AND VERIFIED WITH: Adelene Idler RN 0142 06/29/19 HNM    Yersinia enterocolitica NOT DETECTED NOT DETECTED Final   Vibrio species NOT DETECTED NOT DETECTED Final   Vibrio cholerae NOT DETECTED NOT DETECTED Final   Enteroaggregative E coli (EAEC) NOT DETECTED NOT DETECTED Final   Enteropathogenic E coli (EPEC) NOT DETECTED NOT DETECTED Final   Enterotoxigenic E coli (ETEC) NOT DETECTED NOT DETECTED Final   Shiga like toxin producing E coli (STEC) NOT DETECTED NOT DETECTED Final   Shigella/Enteroinvasive E coli (EIEC) NOT DETECTED NOT DETECTED Final   Cryptosporidium NOT DETECTED NOT DETECTED Final   Cyclospora cayetanensis NOT DETECTED NOT DETECTED Final   Entamoeba histolytica NOT DETECTED NOT DETECTED Final   Giardia lamblia NOT DETECTED NOT DETECTED Final   Adenovirus F40/41 NOT DETECTED NOT DETECTED Final   Astrovirus NOT DETECTED NOT DETECTED Final   Norovirus GI/GII NOT DETECTED NOT DETECTED Final   Rotavirus A NOT DETECTED NOT DETECTED Final   Sapovirus (I, II, IV, and V) NOT DETECTED NOT DETECTED Final    Comment: Performed at [facility], [address] [place], [place] [address]  C Difficile Quick Screen w PCR reflex     Status: Abnormal   Collection Time: 06/29/19 12:08 AM   Specimen: Stool  Result Value Ref Range Status   C Diff antigen POSITIVE (A) NEGATIVE Final   C Diff toxin NEGATIVE NEGATIVE Final   C Diff interpretation Results are indeterminate. See PCR results.  Final    Comment: Performed at [facility], [address] [place], [place] [address]  Respiratory Panel by RT PCR (Flu A&B, Covid) - Nasopharyngeal Swab     Status: None   Collection Time: 06/29/19 12:08 AM   Specimen: Nasopharyngeal Swab  Result Value Ref Range Status   SARS Coronavirus 2 by RT PCR NEGATIVE NEGATIVE Final    Comment:  (NOTE) SARS-CoV-2 target nucleic acids are NOT DETECTED. The SARS-CoV-2 RNA is generally detectable in upper respiratoy specimens during the acute phase of infection. The lowest concentration of SARS-CoV-2 viral copies this assay can detect is 131 copies/mL. A negative result does not preclude SARS-Cov-2 infection and should not be used as the sole basis for treatment  or other patient management decisions. A negative result may occur with  improper specimen collection/handling, submission of specimen other than nasopharyngeal swab, presence of viral mutation(s) within the areas targeted by this assay, and inadequate number of viral copies (<131 copies/mL). A negative result must be combined with clinical observations, patient history, and epidemiological information. The expected result is Negative. Fact Sheet for Patients:  PinkCheek.be Fact Sheet for Healthcare Providers:  GravelBags.it This test is not yet ap proved or cleared by the [place] FDA and  has been authorized for detection and/or diagnosis of SARS-CoV-2 by FDA under an Emergency Use Authorization (EUA). This EUA will remain  in effect (meaning this test can be used) for the duration of the COVID-19 declaration under Section 564(b)(1) of the Act, 21 U.S.C. section 360bbb-3(b)(1), unless the authorization is terminated or revoked sooner.    Influenza A by PCR NEGATIVE NEGATIVE Final   Influenza B by PCR NEGATIVE NEGATIVE Final    Comment: (NOTE) The Xpert Xpress SARS-CoV-2/FLU/RSV assay is intended as an aid in  the diagnosis of influenza from Nasopharyngeal swab specimens and  should not be used as a sole basis for treatment. Nasal washings and  aspirates are unacceptable for Xpert Xpress SARS-CoV-2/FLU/RSV  testing. Fact Sheet for Patients: PinkCheek.be Fact Sheet for Healthcare  Providers: GravelBags.it This test is not yet approved or cleared by the [place] FDA and  has been authorized for detection and/or diagnosis of SARS-CoV-2 by  FDA under an Emergency Use Authorization (EUA). This EUA will remain  in effect (meaning this test can be used) for the duration of the  Covid-19 declaration under Section 564(b)(1) of the Act, 21  U.S.C. section 360bbb-3(b)(1), unless the authorization is  terminated or revoked. Performed at [facility], [address] [place], [place] [address]   C. Diff by PCR, Reflexed     Status: Abnormal   Collection Time: 06/29/19 12:08 AM  Result Value Ref Range Status   Toxigenic C. Difficile by PCR POSITIVE (A) NEGATIVE Final    Comment: Positive for toxigenic C. difficile with little to no toxin production. Only treat if clinical presentation suggests symptomatic illness. Performed at [facility], [address], [place], [place] [address]   Urine Culture     Status: Abnormal   Collection Time: 06/29/19 12:39 PM   Specimen: Urine, Catheterized  Result Value Ref Range Status   Specimen Description   Final    URINE, CATHETERIZED Performed at [facility], [address] [place], [place] [address]    Special Requests   Final    NONE Performed at [facility], [address] [place], [place] [address]    Culture (A)  Final    <10,000 COLONIES/mL INSIGNIFICANT GROWTH Performed at [facility], [place] [address], [place], [place] [address]    Report Status 06/30/2019 FINAL  Final  CULTURE, BLOOD (ROUTINE X 2) w Reflex to ID Panel     Status: None   Collection Time: 06/29/19  3:04 PM   Specimen: BLOOD  Result Value Ref Range Status   Specimen Description BLOOD BLOOD RIGHT HAND  Final   Special Requests   Final    BOTTLES DRAWN AEROBIC ONLY Blood Culture results may not be optimal due to an inadequate volume of blood received in culture bottles    Culture   Final    NO GROWTH 5 DAYS Performed at [facility], [address], [place], [place] [address]  Report Status 07/04/2019 FINAL  Final  Culture, blood (Routine X 2) w Reflex to ID Panel     Status: None   Collection Time: 06/29/19 11:01 PM   Specimen: BLOOD  Result Value Ref Range Status   Specimen Description BLOOD BLOOD RIGHT HAND  Final   Special Requests   Final    BOTTLES DRAWN AEROBIC AND ANAEROBIC Blood Culture results may not be optimal due to an inadequate volume of blood received in culture bottles   Culture   Final    NO GROWTH 5 DAYS Performed at [facility], [address], [place], [place] [address]    Report Status 07/04/2019 FINAL  Final         Radiology Studies: [facility] Chest Port 1 View  Result Date: 07/03/2019 CLINICAL DATA:  Diarrhea and vomiting.  Difficulty breathing. EXAM: PORTABLE CHEST 1 VIEW COMPARISON:  June 29, 2019 FINDINGS: Stable left retrocardiac opacity. The cardiomediastinal silhouette is stable. No pneumothorax. No nodules or masses. Mild pulmonary venous congestion not excluded. No overt edema. IMPRESSION: 1. Stable left retrocardiac opacity. Mild pulmonary venous congestion. No other acute abnormalities. Electronically Signed   By: Dorise Bullion III M.D   On: 07/03/2019 13:06        Scheduled Meds: . amLODipine  10 mg Oral Daily  . apixaban  2.5 mg Oral Q12H  . aspirin EC  81 mg Oral Daily  . escitalopram  15 mg Oral Daily  . finasteride  5 mg Oral Daily  . fluticasone  2 spray Each Nare Daily  . gabapentin  400 mg Oral BID  . insulin aspart  0-15 Units Subcutaneous Q4H  . insulin glargine  20 Units Subcutaneous QHS  . latanoprost  1 drop Both Eyes QHS  . pantoprazole (PROTONIX) IV  40 mg Intravenous QHS  . tamsulosin  0.4 mg Oral Daily  . vancomycin  500 mg Oral Q6H   Continuous Infusions: . sodium chloride 10 mL/hr at 07/04/19 0300  . cefTRIAXone (ROCEPHIN)  IV Stopped (07/04/19 0017)  .  metronidazole 500 mg (07/04/19 0950)     LOS: 5 days    Time spent: 35 min    Sidney Ace, MD [facility] Pager [phone]  If 7PM-7AM, please contact night-coverage 07/04/2019, 1:04 PM

## 2019-07-05 DIAGNOSIS — D649 Anemia, unspecified: Secondary | ICD-10-CM | POA: Diagnosis not present

## 2019-07-05 DIAGNOSIS — Z9889 Other specified postprocedural states: Secondary | ICD-10-CM

## 2019-07-05 LAB — GLUCOSE, CAPILLARY
Glucose-Capillary: 140 mg/dL — ABNORMAL HIGH (ref 70–99)
Glucose-Capillary: 128 mg/dL — ABNORMAL HIGH (ref 70–99)
Glucose-Capillary: 236 mg/dL — ABNORMAL HIGH (ref 70–99)
Glucose-Capillary: 249 mg/dL — ABNORMAL HIGH (ref 70–99)
Glucose-Capillary: 248 mg/dL — ABNORMAL HIGH (ref 70–99)

## 2019-07-05 LAB — BASIC METABOLIC PANEL
Anion gap: 8 (ref 5–15)
BUN: 30 mg/dL — ABNORMAL HIGH (ref 8–23)
CO2: 26 mmol/L (ref 22–32)
Calcium: 8.9 mg/dL (ref 8.9–10.3)
Chloride: 106 mmol/L (ref 98–111)
Creatinine, Ser: 1.66 mg/dL — ABNORMAL HIGH (ref 0.61–1.24)
GFR calc Af Amer: 46 mL/min — ABNORMAL LOW (ref >60)
GFR calc non Af Amer: 39 mL/min — ABNORMAL LOW (ref >60)
Glucose, Bld: 139 mg/dL — ABNORMAL HIGH (ref 70–99)
Potassium: 3.6 mmol/L (ref 3.5–5.1)
Sodium: 140 mmol/L (ref 135–145)

## 2019-07-05 MED ORDER — INSULIN ASPART 100 UNIT/ML ~~LOC~~ SOLN
0.0000 [IU] | Freq: Three times a day (TID) | SUBCUTANEOUS | Status: DC
  Administered 2019-07-05: 7 [IU] via SUBCUTANEOUS
  Administered 2019-07-06: 11 [IU] via SUBCUTANEOUS
  Administered 2019-07-06 – 2019-07-07 (×3): 4 [IU] via SUBCUTANEOUS
  Administered 2019-07-07: 7 [IU] via SUBCUTANEOUS
  Filled 2019-07-05 (×6): qty 1

## 2019-07-05 MED ORDER — VANCOMYCIN 50 MG/ML ORAL SOLUTION
125.0000 mg | Freq: Four times a day (QID) | ORAL | Status: DC
  Administered 2019-07-05 – 2019-07-07 (×9): 125 mg via ORAL
  Filled 2019-07-05 (×12): qty 2.5

## 2019-07-05 MED ORDER — INSULIN ASPART 100 UNIT/ML ~~LOC~~ SOLN
6.0000 [IU] | Freq: Three times a day (TID) | SUBCUTANEOUS | Status: DC
  Administered 2019-07-05 – 2019-07-07 (×6): 6 [IU] via SUBCUTANEOUS
  Filled 2019-07-05 (×6): qty 1

## 2019-07-05 MED ORDER — BENAZEPRIL HCL 20 MG PO TABS
20.0000 mg | ORAL_TABLET | Freq: Every day | ORAL | Status: DC
  Administered 2019-07-05 – 2019-07-06 (×2): 20 mg via ORAL
  Filled 2019-07-05 (×2): qty 1

## 2019-07-05 MED ORDER — INSULIN ASPART 100 UNIT/ML ~~LOC~~ SOLN
0.0000 [IU] | Freq: Every day | SUBCUTANEOUS | Status: DC
  Administered 2019-07-05: 2 [IU] via SUBCUTANEOUS
  Administered 2019-07-06: 3 [IU] via SUBCUTANEOUS
  Filled 2019-07-05 (×2): qty 1

## 2019-07-05 NOTE — Care Management Important Message (Signed)
Important Message  Patient Details  Name: Derek Andersen MRN: [id number] Date of Birth: 10-21-1940   Medicare Important Message Given:  Yes  Gave to bedside RN due to COVID room-explained to over the telephone   Elease Hashimoto, LCSW 07/05/2019, 11:21 AM

## 2019-07-05 NOTE — Progress Notes (Addendum)
Inpatient Diabetes Program Recommendations  AACE/ADA: New Consensus Statement on Inpatient Glycemic Control (2015)  Target Ranges:  Prepandial:   less than 140 mg/dL      Peak postprandial:   less than 180 mg/dL (1-2 hours)      Critically ill patients:  140 - 180 mg/dL   Results for BELDON, NOWLING (MRN [id number]) as of 07/05/2019 08:14  Ref. Range 07/03/2019 23:56 07/04/2019 03:40 07/04/2019 08:45 07/04/2019 12:03 07/04/2019 17:30 07/04/2019 20:06  Glucose-Capillary Latest Ref Range: 70 - 99 mg/dL 209 (H)  5 units NOVOLOG  136 (H)  2 units NOVOLOG  160 (H)  3 units NOVOLOG  255 (H)  8 units NOVOLOG  326 (H)  11 units NOVOLOG  384 (H)  15 units NOVOLOG +  20 units LANTUS   Results for PERKINS, MOLINA (MRN [id number]) as of 07/05/2019 08:14  Ref. Range 07/04/2019 23:25 07/05/2019 03:52  Glucose-Capillary Latest Ref Range: 70 - 99 mg/dL 243 (H)  5 units NOVOLOG  140 (H)  2 units NOVOLOG     Home DM Meds: Lantus 50 units QHS       Novolog 50 units w/ Breakfast       Novolog 40 units w/ Lunch       Novolog 56-60 units w/ Dinner  Current Orders: Lantus 20 units QHS      Novolog Moderate Correction Scale/ SSI (0-15 units) Q4 hours    MD- Note afternoon CBGs have been running >200 the last 2 days.  Now getting Full Liquid diet  May consider the following:  1. Change Novolog SSi to the Resistant scale (0-20 units) TID AC + HS  2. Start Novolog Meal Coverage: Novolog 6 units TID with meals (Please add the following Hold Parameters: Hold if pt eats <50% of meal, Hold if pt NPO)    --Will follow patient during hospitalization--  Wyn Quaker RN, MSN, CDE Diabetes Coordinator Inpatient Glycemic Control Team Team Pager: [phone] (8a-5p)

## 2019-07-05 NOTE — Evaluation (Signed)
Physical Therapy Evaluation Patient Details Name: Derek Andersen MRN: [id number] DOB: Mar 17, 1940 Today's Date: 07/05/2019   History of Present Illness  Pt is 79 y/o M with PMH: A. fib on Eliquis, CAD, CKD 4, DM, anemia of CKD, BPH and OSA. Pt presented to ED with a 3-day history of diarrhea and vomiting associated with weakness. Found to be positive for C.Diff and Salmonella. Of note: rectal tube removed 4/26.    Clinical Impression  Pt alert, oriented, up in chair at start of session, requested to use BSC. Patient reported prior to hospital admission, ambulatory for household distances with rollator, modI/I for dressing/bathing, family and aide assist with homemaking duties.  The patient needed modAx2 with RW to come to standing, able to stand pivot to [facility], fatigued quickly. modAx1 with RW to maintain standing to allow for total assist after BM. Pt able to take 1-2 steps to pivot to bed, modAx1-2 for safety. Pt very fatigued and mildly SOB after activity. Assisted to supine with modA for LE management.  Overall the patient demonstrated deficits (see "PT Problem List") that impede the patient's functional abilities, safety, and mobility and would benefit from skilled PT intervention. Recommendation is SNF due to acute decline in functional status and current level of assistance needed, pt resistant and eager to return home to wife.      Follow Up Recommendations SNF    Equipment Recommendations  Other (comment)(TBD at nexte venue of care)    Recommendations for Other Services       Precautions / Restrictions Precautions Precautions: Fall Restrictions Weight Bearing Restrictions: No Other Position/Activity Restrictions: Pt reported some dizziness that resolved if he stopped moving. BP at EOB WFLs      Mobility  Bed Mobility Overal bed mobility: Needs Assistance Bed Mobility: Sit to Supine       Sit to supine: Mod assist   General bed mobility comments: for LE  assist  Transfers Overall transfer level: Needs assistance Equipment used: Rolling walker (2 wheeled) Transfers: Sit to/from [facility] Sit to Stand: Mod assist;+2 physical assistance Stand pivot transfers: Mod assist;+2 safety/equipment       General transfer comment: Pt able to stand from recliner, and pivot to [facility] with modAx2. from [facility] modAx2 to achieve standing position, modAx1 to pivot to bed  Ambulation/Gait             General Gait Details: deferred due to weakness  Stairs            Wheelchair Mobility    Modified Rankin (Stroke Patients Only)       Balance Overall balance assessment: Needs assistance Sitting-balance support: Feet supported Sitting balance-Leahy Scale: Fair     Standing balance support: Bilateral upper extremity supported Standing balance-Leahy Scale: Poor Standing balance comment: pt reported that he has to lock his knees to stay upright                             Pertinent Vitals/Pain Pain Assessment: Faces Faces Pain Scale: Hurts a little bit Pain Location: abdomen Pain Descriptors / Indicators: Grimacing Pain Intervention(s): Limited activity within patient's tolerance;Monitored during session;Repositioned    Home Living Family/patient expects to be discharged to:: Private residence Living Arrangements: Spouse/significant other Available Help at Discharge: Family;Personal care attendant;Other (Comment)(aide 4 hrs a day, SIL and son assist some) Type of Home: House Home Access: Ramped entrance     Home Layout: One level Home Equipment: Clinical cytogeneticist -  4 wheels;Other (comment);Bedside commode;Grab bars - tub/shower      Prior Function Level of Independence: Needs assistance   Gait / Transfers Assistance Needed: Pt MOD I with fxl mobility with 4WW both in and out of home. Uses golf cart for longer distances on his property (>1acre)     Comments: His spouse has chronic lung disease and  is on hospice care. Pt does not drive. His SIL or son take him to appointments. His dtr used to assist with this, but is currently having heart trouble and requiring valve replacement surgery. Of note, pt endorses at least one fall/year. One in 2016 and in 2017 both resulted in need for back surgeries.     Hand Dominance        Extremity/Trunk Assessment   Upper Extremity Assessment Upper Extremity Assessment: Generalized weakness    Lower Extremity Assessment Lower Extremity Assessment: Generalized weakness       Communication   Communication: No difficulties  Cognition Arousal/Alertness: Awake/alert Behavior During Therapy: WFL for tasks assessed/performed Overall Cognitive Status: No family/caregiver present to determine baseline cognitive functioning                                        General Comments      Exercises     Assessment/Plan    PT Assessment Patient needs continued PT services  PT Problem List Decreased strength;Decreased mobility;Decreased activity tolerance;Decreased balance;Decreased knowledge of use of DME       PT Treatment Interventions DME instruction;Therapeutic exercise;Gait training;Balance training;Neuromuscular re-education;Functional mobility training;Therapeutic activities;Patient/family education    PT Goals (Current goals can be found in the Care Plan section)  Acute Rehab PT Goals Patient Stated Goal: to get his strength back PT Goal Formulation: With patient Time For Goal Achievement: 07/19/19 Potential to Achieve Goals: Good    Frequency Min 2X/week   Barriers to discharge Decreased caregiver support      Co-evaluation               AM-PAC PT "6 Clicks" Mobility  Outcome Measure Help needed turning from your back to your side while in a flat bed without using bedrails?: A Lot Help needed moving from lying on your back to sitting on the side of a flat bed without using bedrails?: A Lot Help needed  moving to and from a bed to a chair (including a wheelchair)?: A Lot Help needed standing up from a chair using your arms (e.g., wheelchair or bedside chair)?: A Lot Help needed to walk in hospital room?: A Lot Help needed climbing 3-5 steps with a railing? : Total 6 Click Score: 11    End of Session Equipment Utilized During Treatment: Gait belt Activity Tolerance: Patient tolerated treatment well Patient left: in bed;with bed alarm set;with call bell/phone within reach Nurse Communication: Mobility status PT Visit Diagnosis: Other abnormalities of gait and mobility (R26.89);Muscle weakness (generalized) (M62.81);Difficulty in walking, not elsewhere classified (R26.2)    Time: 4037-0964 PT Time Calculation (min) (ACUTE ONLY): 24 min   Charges:   PT Evaluation $PT Eval Moderate Complexity: 1 Mod PT Treatments $Therapeutic Activity: 8-22 mins      Lieutenant Diego PT, DPT 3:04 PM,07/05/19

## 2019-07-05 NOTE — Plan of Care (Signed)

## 2019-07-05 NOTE — Progress Notes (Signed)
ID Doing better sitting in chair Rectal foley removed Says last bowel movt was last night Says diarrhea improving   Patient Vitals for the past 24 hrs:  BP Temp Temp src Pulse Resp SpO2  07/05/19 0854 (!) 172/72 (!) 97.4 F (36.3 C) -- 68 18 96 %  07/04/19 2325 (!) 176/62 97.9 F (36.6 C) Oral 63 20 98 %  07/04/19 1726 (!) 175/67 98.7 F (37.1 C) -- 66 18 96 %   O/E  Awake and alert No distress Chest b/l air entry HS irrgeular abd soft- obese, distended  CBC Latest Ref Rng & Units 07/03/2019 07/02/2019 07/01/2019  WBC 4.0 - 10.5 K/uL 9.4 7.7 5.3  Hemoglobin 13.0 - 17.0 g/dL 12.6(L) 12.2(L) 12.3(L)  Hematocrit 39.0 - 52.0 % 38.1(L) 36.6(L) 37.8(L)  Platelets 150 - 400 K/uL 183 194 170    CMP Latest Ref Rng & Units 07/05/2019 07/04/2019 07/03/2019  Glucose 70 - 99 mg/dL 139(H) 158(H) 176(H)  BUN 8 - 23 mg/dL 30(H) 29(H) 28(H)  Creatinine 0.61 - 1.24 mg/dL 1.66(H) 1.82(H) 1.93(H)  Sodium 135 - 145 mmol/L 140 140 137  Potassium 3.5 - 5.1 mmol/L 3.6 2.9(L) 3.3(L)  Chloride 98 - 111 mmol/L 106 105 107  CO2 22 - 32 mmol/L 26 26 20(L)  Calcium 8.9 - 10.3 mg/dL 8.9 8.8(L) 8.7(L)  Total Protein 6.5 - 8.1 g/dL - - -  Total Bilirubin 0.3 - 1.2 mg/dL - - -  Alkaline Phos 38 - 126 U/L - - -  AST 15 - 41 U/L - - -  ALT 0 - 44 U/L - - -    Impression/recommendation  Febrile illness with gastroenteritis- much improved salmonella gastroenteritis This very likely is non typhoidal species-  Culture sent Not sure what put himat risk ? Food source  He ate sandwich ( fish) and meat from outside No bacteremia On IV ceftriaxone day 6. Can stop after tomorrow's dose also confusing is that he has cdiffCdiff antigen positive , tox negative and PCR positive- no recent antibiotic or steroid use CT abdomen from 4/21 colon is fluid-filled throughout although nondilated, with mild fat stranding primarily noted about the transverse and proximal descending colon. Findings are generally in  keeping with infectious or inflammatory colitis. vanco increased  to $R'500mg'pz$  PO q 6 and  added IV flagyl to treat like severe cdiff colitis on 07/01/19- will decrease vanco to $Remove'125mg'[id number]$  Q 6   AKI on CKDimproving    Anemia- Hb 6 ( was 11)- received 2 units and Hb 12.5 FOB positive   Afib-   Hardware spine  CAD s/p CABG ? ?DM- on sliding scale  Out of bed, in chair Need to ambulate Discussed the management with the patient

## 2019-07-05 NOTE — Progress Notes (Signed)
PROGRESS NOTE    Derek Andersen  RFF:[id number] DOB: 1941-01-26 DOA: 06/28/2019 PCP: Derinda Late, MD   Brief Narrative:  HPI: Derek Andersen is a 79 y.o. male with medical history significant for A. fib on Eliquis, CAD, CKD 4, DM, anemia of CKD, BPH and OSA who presents to the emergency room with a 3-day history of diarrhea and vomiting associated with weakness.  He denied fever, chills or abdominal pain.  Denies chest pain or shortness of breath.  His vomit is yellow gastric contents, nonbloody nonbilious and no coffee grounds.  Diarrhea is watery and yellow, no blood in the stool no black stool. No affected contacts.  4/21: Patient seen and examined.  Still significantly weakened and deconditioned.  Rectal tube remains in place.  Hemoglobin stable over interval.  4/22: Patient seen and examined.  Starting to improve.  Rectal tube remains in place.  Hemoglobin stable over interval.  No bleeding noted.  No fevers over interval.  4/23: Patient seen and examined.  Rectal tube in place.  Very loose stools persistent.  Discussed with ID.  Vancomycin increased to 500 mg p.o. every 6 hours.  Continues on IV Rocephin.  4/24: Patient seen and examined.  Rectal tube remains in place.  Output decreasing.  Feeling better today.  Vancomycin continuing at $RemoveBefor'500mg'[id number]$ , Flagyl added per ID yesterday.  Treat as if severe C. difficile colitis.  Some wheezing and shortness of breath noted on exam this morning.  IV fluids held.  4/25: Patient seen and examined.  Rectal tube remains in place.  Output decreasing.  Status about stable from yesterday.  Vancomycin continuing, Flagyl continuing, Rocephin continuing.  Oxygen status improved.  Currently on 2 to 3 L nasal cannula.  No wheezing noted.  4/26: Patient seen and examined.  Rectal tube output decreasing.  Per patient stools becoming more formed.  Energy level improving.  Vancomycin, Flagyl, Rocephin continuing.  Titrated down to 1 L nasal cannula.  No  wheezing noted on exam.   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 difficile colitis  Acute Salmonella gastroenteritis C. difficile toxin positive antigen negative 3 days of nausea vomiting Unclear trigger, possibly chicken salad versus fish sandwich Infectious disease consulted, started on oral Vanco and Rocephin CT abd- fluid filled colon, not distended, stranding consistent with colitis Plan: Continue vancomycin p.o. 500 q6h Flagyl 500 mg 3 times daily added per ID Continue IV Rocephin Patient appears to be clinically responding We will defer repeat CAT scan at this time Rectal tube output decreasing, will discontinue Out of bed, mobilize.  Discussed with bedside RN PT/OT consult: rec SNF TOC consult  Symptomatic anemia Possible GI bleed Stool guaiac positive Transfuse 2 units in ED GI consulted, no plans for scope, FOBT secondary to acute infectious colitis Plan: Eliquis restarted IV PPI Treat infection as above  Acute renal failure superimposed on stage 4 chronic kidney disease ([place]) -IV hydration and monitor renal function -Consult nephrology if worsening Improving over interval    Chronic anticoagulation   Atrial  fibrillation, chronic (HCC) -Patient on Eliquis for A. fib -Restarted 4/22, no bleeding noted    CAD (coronary artery disease) -No chest pain -Eliquis restarted - Continue beta-blockers and statins  Type 2 diabetes mellitus with hyperlipidemia (HCC) -Insulin sliding scale  coverage   DVT prophylaxis: Eliquis Code Status: Full code Family Communication: Spouse at bedside Disposition Plan: Status is: Inpatient  Remains inpatient appropriate because:Inpatient level of care appropriate due to severity of illness   Dispo: The patient is from: Home              Anticipated d/c is to: SNF              Anticipated d/c date is: 3 days              Patient currently is not medically stable to d/c.    Consultants:   GI  ID  Procedures:   none  Antimicrobials:  Rocephin  PO vancomycin    Subjective: Seen and examined Endorses some improvement in symptoms No fevers noted over interval  Objective: Vitals:   07/04/19 0928 07/04/19 1726 07/04/19 2325 07/05/19 0854  BP: (!) 180/63 (!) 175/67 (!) 176/62 (!) 172/72  Pulse: 70 66 63 68  Resp: (!) $RemoveB'26 18 20 18  'tQfreTgu$ Temp: 98.4 F (36.9 C) 98.7 F (37.1 C) 97.9 F (36.6 C) (!) 97.4 F (36.3 C)  TempSrc: Oral  Oral   SpO2: 96% 96% 98% 96%  Weight:      Height:        Intake/Output Summary (Last 24 hours) at 07/05/2019 1310 Last data filed at 07/05/2019 0410 Gross per 24 hour  Intake --  Output 1200 ml  Net -1200 ml   Filed Weights   06/28/19 1959  Weight: 103.9 kg    Examination:  General exam: Appears calm and comfortable  Respiratory system: Clear to auscultation. Respiratory effort normal. Cardiovascular system: S1 & S2 heard, RRR. No JVD, murmurs, rubs, gallops or clicks. No pedal edema. Gastrointestinal system: Hyperactive bowel sounds, rectal tube in place Central nervous system: Alert and oriented. No focal neurological deficits. Extremities: Symmetric 5 x 5 power. Skin: No rashes, lesions or ulcers Psychiatry: Judgement and insight appear normal. Mood & affect appropriate.     Data Reviewed: I have personally reviewed following labs and imaging studies  CBC: Recent Labs  Lab 06/29/19 1239 06/30/19 0539 07/01/19 0756 07/02/19 0632 07/03/19 0537  WBC 7.6 6.2 5.3 7.7 9.4   NEUTROABS  --   --  3.9 5.8 7.3  HGB 13.7 12.5* 12.3* 12.2* 12.6*  HCT 42.8 39.0 37.8* 36.6* 38.1*  MCV 93.7 92.9 91.3 89.7 90.9  PLT 213 188 170 194 417   Basic Metabolic Panel: Recent Labs  Lab 07/01/19 0756 07/02/19 0632 07/03/19 0537 07/04/19 0458 07/05/19 0633  NA 136 138 137 140 140  K 3.4* 3.3* 3.3* 2.9* 3.6  CL 105 107 107 105 106  CO2 21* 21* 20* 26 26  GLUCOSE 184* 168* 176* 158* 139*  BUN 50* 39* 28* 29* 30*  CREATININE 3.04* 2.37* 1.93* 1.82* 1.66*  CALCIUM 8.4* 8.5* 8.7* 8.8* 8.9   GFR: Estimated Creatinine Clearance: 42.7 mL/min (A) (by C-G formula based on SCr of 1.66 mg/dL (H)). Liver Function Tests: Recent Labs  Lab 06/28/19 2002 06/30/19 0539  AST 18 20  ALT 13 15  ALKPHOS 49 35*  BILITOT 1.0 0.9  PROT 7.7 6.1*  ALBUMIN 3.8 2.8*   Recent Labs  Lab 06/28/19 2002  LIPASE 24  No results for input(s): AMMONIA in the last 168 hours. Coagulation Profile: Recent Labs  Lab 06/28/19 2304  INR 1.1   Cardiac Enzymes: No results for input(s): CKTOTAL, CKMB, CKMBINDEX, TROPONINI in the last 168 hours. BNP (last 3 results) No results for input(s): PROBNP in the last 8760 hours. HbA1C: No results for input(s): HGBA1C in the last 72 hours. CBG: Recent Labs  Lab 07/04/19 2006 07/04/19 2325 07/05/19 0352 07/05/19 0853 07/05/19 1145  GLUCAP 384* 243* 140* 128* 236*   Lipid Profile: No results for input(s): CHOL, HDL, LDLCALC, TRIG, CHOLHDL, LDLDIRECT in the last 72 hours. Thyroid Function Tests: No results for input(s): TSH, T4TOTAL, FREET4, T3FREE, THYROIDAB in the last 72 hours. Anemia Panel: No results for input(s): VITAMINB12, FOLATE, FERRITIN, TIBC, IRON, RETICCTPCT in the last 72 hours. Sepsis Labs: Recent Labs  Lab 06/28/19 2304  LATICACIDVEN 0.4*    Recent Results (from the past 240 hour(s))  Gastrointestinal Panel by PCR , Stool     Status: Abnormal   Collection Time: 06/29/19 12:08 AM   Specimen: Stool  Result Value Ref  Range Status   Campylobacter species NOT DETECTED NOT DETECTED Final   Plesimonas shigelloides NOT DETECTED NOT DETECTED Final   Salmonella species DETECTED (A) NOT DETECTED Final    Comment: RESULT CALLED TO, READ BACK BY AND VERIFIED WITH: Adelene Idler RN 0142 06/29/19 HNM    Yersinia enterocolitica NOT DETECTED NOT DETECTED Final   Vibrio species NOT DETECTED NOT DETECTED Final   Vibrio cholerae NOT DETECTED NOT DETECTED Final   Enteroaggregative E coli (EAEC) NOT DETECTED NOT DETECTED Final   Enteropathogenic E coli (EPEC) NOT DETECTED NOT DETECTED Final   Enterotoxigenic E coli (ETEC) NOT DETECTED NOT DETECTED Final   Shiga like toxin producing E coli (STEC) NOT DETECTED NOT DETECTED Final   Shigella/Enteroinvasive E coli (EIEC) NOT DETECTED NOT DETECTED Final   Cryptosporidium NOT DETECTED NOT DETECTED Final   Cyclospora cayetanensis NOT DETECTED NOT DETECTED Final   Entamoeba histolytica NOT DETECTED NOT DETECTED Final   Giardia lamblia NOT DETECTED NOT DETECTED Final   Adenovirus F40/41 NOT DETECTED NOT DETECTED Final   Astrovirus NOT DETECTED NOT DETECTED Final   Norovirus GI/GII NOT DETECTED NOT DETECTED Final   Rotavirus A NOT DETECTED NOT DETECTED Final   Sapovirus (I, II, IV, and V) NOT DETECTED NOT DETECTED Final    Comment: Performed at [facility], [address] [place], [place] [address]  C Difficile Quick Screen w PCR reflex     Status: Abnormal   Collection Time: 06/29/19 12:08 AM   Specimen: Stool  Result Value Ref Range Status   C Diff antigen POSITIVE (A) NEGATIVE Final   C Diff toxin NEGATIVE NEGATIVE Final   C Diff interpretation Results are indeterminate. See PCR results.  Final    Comment: Performed at [facility], [address] [place], [place] [address]  Respiratory Panel by RT PCR (Flu A&B, Covid) - Nasopharyngeal Swab     Status: None   Collection Time: 06/29/19 12:08 AM   Specimen: Nasopharyngeal Swab  Result Value Ref Range  Status   SARS Coronavirus 2 by RT PCR NEGATIVE NEGATIVE Final    Comment: (NOTE) SARS-CoV-2 target nucleic acids are NOT DETECTED. The SARS-CoV-2 RNA is generally detectable in upper respiratoy specimens during the acute phase of infection. The lowest concentration of SARS-CoV-2 viral copies this assay can detect is 131 copies/mL. A negative result does not preclude SARS-Cov-2 infection and should not be  used as the sole basis for treatment or other patient management decisions. A negative result may occur with  improper specimen collection/handling, submission of specimen other than nasopharyngeal swab, presence of viral mutation(s) within the areas targeted by this assay, and inadequate number of viral copies (<131 copies/mL). A negative result must be combined with clinical observations, patient history, and epidemiological information. The expected result is Negative. Fact Sheet for Patients:  PinkCheek.be Fact Sheet for Healthcare Providers:  GravelBags.it This test is not yet ap proved or cleared by the [place] FDA and  has been authorized for detection and/or diagnosis of SARS-CoV-2 by FDA under an Emergency Use Authorization (EUA). This EUA will remain  in effect (meaning this test can be used) for the duration of the COVID-19 declaration under Section 564(b)(1) of the Act, 21 U.S.C. section 360bbb-3(b)(1), unless the authorization is terminated or revoked sooner.    Influenza A by PCR NEGATIVE NEGATIVE Final   Influenza B by PCR NEGATIVE NEGATIVE Final    Comment: (NOTE) The Xpert Xpress SARS-CoV-2/FLU/RSV assay is intended as an aid in  the diagnosis of influenza from Nasopharyngeal swab specimens and  should not be used as a sole basis for treatment. Nasal washings and  aspirates are unacceptable for Xpert Xpress SARS-CoV-2/FLU/RSV  testing. Fact Sheet for  Patients: PinkCheek.be Fact Sheet for Healthcare Providers: GravelBags.it This test is not yet approved or cleared by the [place] FDA and  has been authorized for detection and/or diagnosis of SARS-CoV-2 by  FDA under an Emergency Use Authorization (EUA). This EUA will remain  in effect (meaning this test can be used) for the duration of the  Covid-19 declaration under Section 564(b)(1) of the Act, 21  U.S.C. section 360bbb-3(b)(1), unless the authorization is  terminated or revoked. Performed at [facility], [address], [place], [place] [address]   C. Diff by PCR, Reflexed     Status: Abnormal   Collection Time: 06/29/19 12:08 AM  Result Value Ref Range Status   Toxigenic C. Difficile by PCR POSITIVE (A) NEGATIVE Final    Comment: Positive for toxigenic C. difficile with little to no toxin production. Only treat if clinical presentation suggests symptomatic illness. Performed at [facility], [address], [place], [place] [address]   Urine Culture     Status: Abnormal   Collection Time: 06/29/19 12:39 PM   Specimen: Urine, Catheterized  Result Value Ref Range Status   Specimen Description   Final    URINE, CATHETERIZED Performed at [facility], [address], [place], [place] [address]    Special Requests   Final    NONE Performed at [facility], [address] [place], [place] [address]    Culture (A)  Final    <10,000 COLONIES/mL INSIGNIFICANT GROWTH Performed at [facility], [place] [address] [place],  [address]    Report Status 06/30/2019 FINAL  Final  CULTURE, BLOOD (ROUTINE X 2) w Reflex to ID Panel     Status: None   Collection Time: 06/29/19  3:04 PM   Specimen: BLOOD  Result Value Ref Range Status   Specimen Description BLOOD BLOOD RIGHT HAND  Final   Special Requests   Final    BOTTLES DRAWN AEROBIC ONLY Blood Culture results may not be  optimal due to an inadequate volume of blood received in culture bottles   Culture   Final    NO GROWTH 5 DAYS Performed at [facility], [address] [place], [place] [address]    Report Status 07/04/2019 FINAL  Final  Culture, blood (Routine X 2) w Reflex to ID Panel     Status: None   Collection Time: 06/29/19 11:01 PM   Specimen: BLOOD  Result Value Ref Range Status   Specimen Description BLOOD BLOOD RIGHT HAND  Final   Special Requests   Final    BOTTLES DRAWN AEROBIC AND ANAEROBIC Blood Culture results may not be optimal due to an inadequate volume of blood received in culture bottles   Culture   Final    NO GROWTH 5 DAYS Performed at [facility], [address] [place], [place] [address]    Report Status 07/04/2019 FINAL  Final         Radiology Studies: No results found.      Scheduled Meds: . amLODipine  10 mg Oral Daily  . apixaban  2.5 mg Oral Q12H  . aspirin EC  81 mg Oral Daily  . benazepril  20 mg Oral Daily  . escitalopram  15 mg Oral Daily  . finasteride  5 mg Oral Daily  . fluticasone  2 spray Each Nare Daily  . gabapentin  400 mg Oral BID  . insulin aspart  0-20 Units Subcutaneous TID WC  . insulin aspart  0-5 Units Subcutaneous QHS  . insulin aspart  6 Units Subcutaneous TID WC  . insulin glargine  20 Units Subcutaneous QHS  . latanoprost  1 drop Both Eyes QHS  . pantoprazole (PROTONIX) IV  40 mg Intravenous QHS  . tamsulosin  0.4 mg Oral Daily  . vancomycin  125 mg Oral Q6H   Continuous Infusions: . sodium chloride 10 mL/hr at 07/04/19 0300  . cefTRIAXone (ROCEPHIN)  IV Stopped (07/05/19 0034)  . metronidazole 500 mg (07/05/19 1149)     LOS: 6 days    Time spent: 35 min    Sidney Ace, MD [facility] Pager [phone]  If 7PM-7AM, please contact night-coverage 07/05/2019, 1:10 PM

## 2019-07-05 NOTE — Evaluation (Signed)
Occupational Therapy Evaluation Patient Details Name: Derek Andersen MRN: [id number] DOB: [birth year] Today's Date: 07/05/2019    History of Present Illness Pt is 79 y/o M with PMH: A. fib on Eliquis, CAD, CKD 4, DM, anemia of CKD, BPH and OSA. Pt presented to ED with a 3-day history of diarrhea and vomiting associated with weakness. Found to be positive for C.Diff and Salmonella. Of note: rectal tube removed 4/26.   Clinical Impression   Pt was seen for OT evaluation this date. Prior to hospital admission, pt was Mod I with fxl mobility with 4WW in the home (golf cart for longer distance on property). Pt able to perform LB bathing/dressing at MOD I with use of shower seat and reacher d/t limited ROM at baseline following multiple back procedures. Pt lives with spouse in [facility] with ramped entrance. Pt's spouse is currently on hospice care. Currently pt demonstrates impairments as described below (See OT problem list) which functionally limit his ability to perform ADL/self-care tasks. Pt currently requires MOD A with RW from elevated surface for ADL transfers. Pt is able to perform UB g/h ADLs with setup, requires TOTAL A for LB ADLs at this time without AE which he uses at baseline.  Pt would benefit from skilled OT to address noted impairments and functional limitations (see below for any additional details) in order to maximize safety and independence while minimizing falls risk and caregiver burden. Upon hospital discharge, recommend STR to maximize pt safety and return to PLOF.     Follow Up Recommendations  SNF;Supervision - Intermittent    Equipment Recommendations  Other (comment)(TBD next venue of care)    Recommendations for Other Services       Precautions / Restrictions Precautions Precautions: Fall Restrictions Weight Bearing Restrictions: No Other Position/Activity Restrictions: +orthostatics on OT evaluation: lying 166/61, sitting 162/64, standing 127/83. After exercise and  t/f to chair, 153/64. RN notified. She had given BP meds at start of session which may also influence results.      Mobility Bed Mobility Overal bed mobility: Needs Assistance Bed Mobility: Sidelying to Sit   Sidelying to sit: Mod assist          Transfers Overall transfer level: Needs assistance Equipment used: Rolling walker (2 wheeled) Transfers: Sit to/from [facility] Sit to Stand: Mod assist Stand pivot transfers: Mod assist            Balance Overall balance assessment: Needs assistance Sitting-balance support: Single extremity supported Sitting balance-Leahy Scale: Fair     Standing balance support: Bilateral upper extremity supported Standing balance-Leahy Scale: Poor Standing balance comment: Requires CGA to MIN A to maintain static standing balance with B UE support with RW.                           ADL either performed or assessed with clinical judgement   ADL                                         General ADL Comments: Pt able to perform UB g/h tasks with setup, MIN A for UB dressing, TOTAL A with LB dressing without AE (uses AE at baseline). MOD A with ADL transfers with RW.     Vision Patient Visual Report: No change from baseline Additional Comments: limited ability to track laterally d/t stiffness in cervical region which  has been chronic since cervical spine surgery.     Perception     Praxis      Pertinent Vitals/Pain Pain Assessment: 0-10 Pain Score: 4  Pain Location: abdomen Pain Descriptors / Indicators: Discomfort Pain Intervention(s): Limited activity within patient's tolerance;Monitored during session;Repositioned     Hand Dominance     Extremity/Trunk Assessment Upper Extremity Assessment Upper Extremity Assessment: Generalized weakness(shld flexion ROM limited bilaterally d/t stiffness in his neck which he reports is baseline d/t hx back surgeries. Elbow MMT 3+/5 bilaterally, grip  4-/5 bilaterally.)   Lower Extremity Assessment Lower Extremity Assessment: Defer to PT evaluation;Generalized weakness   Cervical / Trunk Assessment Cervical / Trunk Assessment: (neck FWD)   Communication Communication Communication: No difficulties   Cognition Arousal/Alertness: Awake/alert Behavior During Therapy: WFL for tasks assessed/performed Overall Cognitive Status: Within Functional Limits for tasks assessed                                 General Comments: does not know day/date, but guesses close to correct information and appears to not know moreso d/t prolonged time in hospital versus cog deficit.   General Comments       Exercises Other Exercises Other Exercises: OT facilitates education re: importance of OOB activity including prevention of PNA, skin breakdown, and muscle atrophy. Pt with good reception. Other Exercises: OT facilitates education re: role of OT in acute setting and pt somewhat familiar as he recieved therapy following previous procedures. Other Exercises: OT engages pt in seated MIP 1 set x15 per side and FWD punches for 1 set x15 reps per side to increase circulation in prep for fxl ADL transfer.   Shoulder Instructions      Home Living Family/patient expects to be discharged to:: Private residence Living Arrangements: Spouse/significant other Available Help at Discharge: Family;Personal care attendant(aide 4hrs/day, SIL assists some, son assists some) Type of Home: House Home Access: Ramped entrance     [place]: One level     Bathroom Shower/Tub: [place]: Clinical cytogeneticist - 4 wheels;Other (comment)(reacher for LB dressing. Sleeps in lift recliner and DOES use lift feature.)          Prior Functioning/Environment Level of Independence: Needs assistance  Gait / Transfers Assistance Needed: Pt MOD I with fxl mobility with 4WW both in and out of home. Uses golf cart for longer distances  on his property (>1acre) ADL's / Homemaking Assistance Needed: Performs LB dressing with reacher, able to bathe himself sitting on shower seat. Aide cooks and cleans, she fixes breakfast and lunch. SIL cooks them dinner or son brings them takeout. They have groceries delivered. Communication / Swallowing Assistance Needed: sometimes soft spoken/whispers, but overall clear/easy to understand. Comments: His spouse has chronic lung disease and is on hospice care. Pt does not drive. His SIL or son take him to appointments. His dtr used to assist with this, but is currently having heart trouble and requiring valve replacement surgery. Of note, pt endorses at least one fall/year. One in 2016 and in 2017 both resulted in need for back surgeries.        OT Problem List: Decreased strength;Decreased range of motion;Decreased activity tolerance;Impaired balance (sitting and/or standing);Cardiopulmonary status limiting activity;Pain      OT Treatment/Interventions: Self-care/ADL training;Therapeutic exercise;Energy conservation;DME and/or AE instruction;Therapeutic activities;Patient/family education;Balance training    OT Goals(Current goals can be found in the  care plan section) Acute Rehab OT Goals Patient Stated Goal: To get to where I can get up and move around by myself again OT Goal Formulation: With patient Time For Goal Achievement: 07/19/19 Potential to Achieve Goals: Good  OT Frequency: Min 2X/week   Barriers to D/C:            Co-evaluation              AM-PAC OT "6 Clicks" Daily Activity     Outcome Measure Help from another person eating meals?: None Help from another person taking care of personal grooming?: A Little Help from another person toileting, which includes using toliet, bedpan, or urinal?: A Lot Help from another person bathing (including washing, rinsing, drying)?: A Lot Help from another person to put on and taking off regular upper body clothing?: A  Little Help from another person to put on and taking off regular lower body clothing?: A Lot 6 Click Score: 16   End of Session Equipment Utilized During Treatment: Gait belt;Rolling walker Nurse Communication: Mobility status  Activity Tolerance: Patient tolerated treatment well Patient left: in chair;with call bell/phone within reach  OT Visit Diagnosis: Unsteadiness on feet (R26.81);Muscle weakness (generalized) (M62.81);History of falling (Z91.81)                Time: 5830-7460 OT Time Calculation (min): 61 min Charges:  OT General Charges $OT Visit: 1 Visit OT Evaluation $OT Eval Moderate Complexity: 1 Mod OT Treatments $Self Care/Home Management : 8-22 mins $Therapeutic Activity: 8-22 mins $Therapeutic Exercise: 8-22 mins  Gerrianne Scale, MS, OTR/L ascom [phone] 07/05/19, 11:20 AM

## 2019-07-05 NOTE — TOC Initial Note (Signed)
Transition of Care [facility]) - Initial/Assessment Note    Patient Details  Name: Derek Andersen MRN: [id number] Date of Birth: 08-Aug-1940  Transition of [facility]) CM/SW Contact:    Elease Hashimoto, LCSW Phone Number: 07/05/2019, 3:49 PM  Clinical Narrative:    Spoke with pt via telephone to discuss discharge plan. He is aware the therapists have recommended SNF and he does not want to go. He has a caregiver every morning for four hours then his sister in-law is next door and son comes to check on him after work. Explained to him they recommend 24 hr care which he does not have.  He wants to see how he does in the next few days and feels he needs to get stronger and will while here. He was independent prior to admission with his basic care, caregiver assists with home management. Hopefully pt will do better byt still have needs 24 hr care at DC. Will continue to work on safe plan for pt for discharge.             Expected Discharge Plan: [place] Barriers to Discharge: Continued Medical Work up   Patient Goals and CMS Choice Patient states their goals for this hospitalization and ongoing recovery are:: I want to go home and not no facility. Let's see how I do in the next few days      Expected Discharge Plan and Services Expected Discharge Plan: [place] In-house Referral: Clinical Social Work   Post Acute Care Choice: Home Health, [place], [place] Living arrangements for the past 2 months: [place]                                      Prior Living Arrangements/Services Living arrangements for the past 2 months: Single Family Home Lives with:: Self Patient language and need for interpreter reviewed:: No Do you feel safe going back to the place where you live?: Yes      Need for Family Participation in Patient Care: Yes (Comment) Care giver support system in place?: Yes (comment)    Criminal Activity/Legal Involvement Pertinent to Current Situation/Hospitalization: No - Comment as needed  Activities of Daily Living Home Assistive Devices/Equipment: Walker (specify type) ADL Screening (condition at time of admission) Patient's cognitive ability adequate to safely complete daily activities?: Yes Is the patient deaf or have difficulty hearing?: No Does the patient have difficulty seeing, even when wearing glasses/contacts?: No Does the patient have difficulty concentrating, remembering, or making decisions?: No Patient able to express need for assistance with ADLs?: Yes Does the patient have difficulty dressing or bathing?: No Independently performs ADLs?: Yes (appropriate for developmental age) Does the patient have difficulty walking or climbing stairs?: No Weakness of Legs: Both Weakness of Arms/Hands: Both  Permission Sought/Granted Permission sought to share information with : Family Supports Permission granted to share information with : Yes, Verbal Permission Granted  Share Information with NAME: connie     Permission granted to share info w Relationship: daughter     Emotional Assessment Appearance:: Appears stated age Attitude/Demeanor/Rapport: Engaged Affect (typically observed): Adaptable, Accepting Orientation: : Oriented to Self, Oriented to Place, Oriented to  Time, Oriented to Situation   Psych Involvement: No (comment)  Admission diagnosis:  Salmonella gastroenteritis [A02.0] Acute blood loss anemia [D62] Infectious colitis [A09] Thrombocytopenia (HCC) [D69.6] Acute respiratory failure  with hypoxia ([place]) [J96.01] C. difficile colitis [A04.72] Symptomatic anemia [D64.9] Fever, unspecified fever cause [R50.9] Gastrointestinal hemorrhage, unspecified gastrointestinal hemorrhage type [K92.2] Acute kidney injury superimposed on chronic kidney disease ([place]) [N17.9, N18.9] Patient Active Problem List   Diagnosis Date Noted  . C. difficile  colitis   . Symptomatic anemia 06/29/2019  . Acute gastroenteritis: C diff and Salmonella positive 06/29/2019  .  Suspect GI bleed 06/29/2019  . Acute renal failure superimposed on stage 4 chronic kidney disease ([place]) 06/29/2019  . Chronic anticoagulation 06/29/2019  . Atrial fibrillation, chronic ([place]) 06/29/2019  . CAD (coronary artery disease) 06/29/2019  . Type 2 diabetes mellitus with hyperlipidemia ([place]) 06/29/2019  . Clostridium difficile enteritis 06/29/2019  . Intestinal infection or food poisoning due to salmonella bacteria 06/29/2019  . Infectious colitis 06/29/2019   PCP:  Derinda Late, MD Pharmacy:  No Pharmacies Listed    Social Determinants of Health (SDOH) Interventions    Readmission Risk Interventions No flowsheet data found.

## 2019-07-06 DIAGNOSIS — A020 Salmonella enteritis: Secondary | ICD-10-CM | POA: Diagnosis not present

## 2019-07-06 DIAGNOSIS — D649 Anemia, unspecified: Secondary | ICD-10-CM | POA: Diagnosis not present

## 2019-07-06 DIAGNOSIS — N179 Acute kidney failure, unspecified: Secondary | ICD-10-CM | POA: Diagnosis not present

## 2019-07-06 DIAGNOSIS — A0472 Enterocolitis due to Clostridium difficile, not specified as recurrent: Secondary | ICD-10-CM | POA: Diagnosis not present

## 2019-07-06 LAB — GLUCOSE, CAPILLARY
Glucose-Capillary: 164 mg/dL — ABNORMAL HIGH (ref 70–99)
Glucose-Capillary: 183 mg/dL — ABNORMAL HIGH (ref 70–99)
Glucose-Capillary: 279 mg/dL — ABNORMAL HIGH (ref 70–99)
Glucose-Capillary: 251 mg/dL — ABNORMAL HIGH (ref 70–99)

## 2019-07-06 MED ORDER — FLUCONAZOLE 50 MG PO TABS
150.0000 mg | ORAL_TABLET | Freq: Every day | ORAL | Status: DC
  Administered 2019-07-06 – 2019-07-07 (×2): 150 mg via ORAL
  Filled 2019-07-06 (×2): qty 1

## 2019-07-06 MED ORDER — PSYLLIUM 95 % PO PACK
1.0000 | PACK | Freq: Every day | ORAL | Status: DC
  Administered 2019-07-06 – 2019-07-07 (×2): 1 via ORAL
  Filled 2019-07-06 (×2): qty 1

## 2019-07-06 MED ORDER — NYSTATIN 100000 UNIT/GM EX CREA
TOPICAL_CREAM | Freq: Two times a day (BID) | CUTANEOUS | Status: DC
  Filled 2019-07-06 (×2): qty 15

## 2019-07-06 MED ORDER — BENAZEPRIL HCL 20 MG PO TABS
40.0000 mg | ORAL_TABLET | Freq: Every day | ORAL | Status: DC
  Administered 2019-07-07: 09:00:00 40 mg via ORAL
  Filled 2019-07-06: qty 2

## 2019-07-06 NOTE — Progress Notes (Signed)
PROGRESS NOTE    Derek Andersen  AGT:[id number] DOB: 1940-08-25 DOA: 06/28/2019 PCP: Derinda Late, MD   Brief Narrative:  HPI: Derek Andersen is a 79 y.o. male with medical history significant for A. fib on Eliquis, CAD, CKD 4, DM, anemia of CKD, BPH and OSA who presents to the emergency room with a 3-day history of diarrhea and vomiting associated with weakness.  He denied fever, chills or abdominal pain.  Denies chest pain or shortness of breath.  His vomit is yellow gastric contents, nonbloody nonbilious and no coffee grounds.  Diarrhea is watery and yellow, no blood in the stool no black stool. No affected contacts.  4/21: Patient seen and examined.  Still significantly weakened and deconditioned.  Rectal tube remains in place.  Hemoglobin stable over interval.  4/22: Patient seen and examined.  Starting to improve.  Rectal tube remains in place.  Hemoglobin stable over interval.  No bleeding noted.  No fevers over interval.  4/23: Patient seen and examined.  Rectal tube in place.  Very loose stools persistent.  Discussed with ID.  Vancomycin increased to 500 mg p.o. every 6 hours.  Continues on IV Rocephin.  4/24: Patient seen and examined.  Rectal tube remains in place.  Output decreasing.  Feeling better today.  Vancomycin continuing at $RemoveBefor'500mg'CkvCAqvJhjBB$ , Flagyl added per ID yesterday.  Treat as if severe C. difficile colitis.  Some wheezing and shortness of breath noted on exam this morning.  IV fluids held.  4/25: Patient seen and examined.  Rectal tube remains in place.  Output decreasing.  Status about stable from yesterday.  Vancomycin continuing, Flagyl continuing, Rocephin continuing.  Oxygen status improved.  Currently on 2 to 3 L nasal cannula.  No wheezing noted.  4/26: Patient seen and examined.  Rectal tube output decreasing.  Per patient stools becoming more formed.  Energy level improving.  Vancomycin, Flagyl, Rocephin continuing.  Titrated down to 1 L nasal cannula.  No  wheezing noted on exam.  4/27: Patient seen and examined. Rectal tube has been removed. Patient appears to be clinically responding. Feels little bit better. Resolved improving. Last dose of Rocephin today. Vancomycin and Flagyl continuing for C. difficile. Respiratory status improved. No longer tachypneic.   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 difficile colitis  Acute Salmonella gastroenteritis C. difficile toxin positive antigen negative 3 days of nausea vomiting Unclear trigger, possibly chicken salad versus fish sandwich Infectious disease consulted, started on oral Vanco and Rocephin CT abd- fluid filled colon, not distended, stranding consistent with coliti Plan: Rectal tube discontinued Continue vancomycin p.o. 500 q6h Flagyl 500 mg 3 times daily added per ID Continue IV Rocephin, last dose today Patient appears to be clinically responding We will defer repeat CAT scan at this time Out of bed, mobilize.  Discussed with bedside RN PT/OT consult: rec SNF.  Patient does not wish to go to skilled nursing facility.  States he has been in facilities before for back issues and does not wish to return TOC  consult Anticipate home with home health.  HH ordered  Symptomatic anemia Possible GI bleed Stool guaiac positive Transfuse 2 units in ED GI consulted, no plans for scope, FOBT secondary to acute infectious colitis Plan: Eliquis restarted IV PPI Treat infection as above  Acute renal failure superimposed on  stage 4 chronic kidney disease ([place]) -IV hydration and monitor renal function -Consult nephrology if worsening Improving over interval    Chronic anticoagulation   Atrial fibrillation, chronic (HCC) -Patient on Eliquis for A. fib -Restarted 4/22, no bleeding noted    CAD (coronary artery disease) -No chest pain -Eliquis restarted - Continue beta-blockers and statins  Type 2 diabetes mellitus with hyperlipidemia (HCC) -Insulin sliding scale coverage   DVT prophylaxis: Eliquis Code Status: Full code Family Communication: Spouse at bedside 4/26 Disposition Plan: Status is: Inpatient  Remains inpatient appropriate because:Inpatient level of care appropriate due to severity of illness   Dispo: The patient is from: Home              Anticipated d/c is to: SNF              Anticipated d/c date is: 2 days              Patient currently is not medically stable to d/c.   Stool still not formed.  Patient clinically responding but not yet at goal.  Last dose of Rocephin today.  Continue n.p.o. vancomycin and IV Flagyl for severe C. difficile colitis.   Consultants:   GI  ID  Procedures:   none  Antimicrobials:  Rocephin  PO vancomycin    Subjective: Seen and examined Endorses some improvement in symptoms No fevers noted over interval  Objective: Vitals:   07/05/19 0854 07/05/19 1654 07/06/19 0057 07/06/19 0741  BP: (!) 172/72 (!) 170/55 (!) 156/59 (!) 188/60  Pulse: 68 70 (!) 59   Resp: $Remo'18 18 18 18  'kuaev$ Temp: (!) 97.4 F (36.3 C) 98.6 F (37 C) 98.1 F (36.7 C) 98.2 F (36.8 C)  TempSrc:  Oral Oral Oral  SpO2: 96% 92% 94% 94%  Weight:      Height:        Intake/Output Summary (Last 24 hours) at 07/06/2019 1344 Last data filed at 07/06/2019 1218 Gross per 24 hour  Intake 822.68 ml  Output 1300 ml  Net -477.32 ml   Filed Weights   06/28/19 1959  Weight: 103.9 kg    Examination:  General exam: Appears calm and comfortable  Respiratory system:  Clear to auscultation. Respiratory effort normal. Cardiovascular system: S1 & S2 heard, RRR. No JVD, murmurs, rubs, gallops or clicks. No pedal edema. Gastrointestinal system: Hyperactive bowel sounds, rectal tube in place Central nervous system: Alert and oriented. No focal neurological deficits. Extremities: Symmetric 5 x 5 power. Skin: No rashes, lesions or ulcers Psychiatry: Judgement and insight appear normal. Mood & affect appropriate.     Data Reviewed: I have personally reviewed following labs and imaging studies  CBC: Recent Labs  Lab 06/30/19 0539 07/01/19 0756 07/02/19 0632 07/03/19 0537  WBC 6.2 5.3 7.7 9.4  NEUTROABS  --  3.9 5.8 7.3  HGB 12.5* 12.3* 12.2* 12.6*  HCT 39.0 37.8* 36.6* 38.1*  MCV 92.9 91.3 89.7 90.9  PLT 188 170 194 578   Basic Metabolic Panel: Recent Labs  Lab 07/01/19 0756 07/02/19 0632 07/03/19 0537 07/04/19 0458 07/05/19 0633  NA 136 138 137 140 140  K 3.4* 3.3* 3.3* 2.9* 3.6  CL 105 107 107 105  106  CO2 21* 21* 20* 26 26  GLUCOSE 184* 168* 176* 158* 139*  BUN 50* 39* 28* 29* 30*  CREATININE 3.04* 2.37* 1.93* 1.82* 1.66*  CALCIUM 8.4* 8.5* 8.7* 8.8* 8.9   GFR: Estimated Creatinine Clearance: 42.7 mL/min (A) (by C-G formula based on SCr of 1.66 mg/dL (H)). Liver Function Tests: Recent Labs  Lab 06/30/19 0539  AST 20  ALT 15  ALKPHOS 35*  BILITOT 0.9  PROT 6.1*  ALBUMIN 2.8*   No results for input(s): LIPASE, AMYLASE in the last 168 hours. No results for input(s): AMMONIA in the last 168 hours. Coagulation Profile: No results for input(s): INR, PROTIME in the last 168 hours. Cardiac Enzymes: No results for input(s): CKTOTAL, CKMB, CKMBINDEX, TROPONINI in the last 168 hours. BNP (last 3 results) No results for input(s): PROBNP in the last 8760 hours. HbA1C: No results for input(s): HGBA1C in the last 72 hours. CBG: Recent Labs  Lab 07/05/19 1145 07/05/19 1719 07/05/19 2116 07/06/19 0739 07/06/19 1147  GLUCAP 236*  249* 248* 164* 183*   Lipid Profile: No results for input(s): CHOL, HDL, LDLCALC, TRIG, CHOLHDL, LDLDIRECT in the last 72 hours. Thyroid Function Tests: No results for input(s): TSH, T4TOTAL, FREET4, T3FREE, THYROIDAB in the last 72 hours. Anemia Panel: No results for input(s): VITAMINB12, FOLATE, FERRITIN, TIBC, IRON, RETICCTPCT in the last 72 hours. Sepsis Labs: No results for input(s): PROCALCITON, LATICACIDVEN in the last 168 hours.  Recent Results (from the past 240 hour(s))  Gastrointestinal Panel by PCR , Stool     Status: Abnormal   Collection Time: 06/29/19 12:08 AM   Specimen: Stool  Result Value Ref Range Status   Campylobacter species NOT DETECTED NOT DETECTED Final   Plesimonas shigelloides NOT DETECTED NOT DETECTED Final   Salmonella species DETECTED (A) NOT DETECTED Final    Comment: RESULT CALLED TO, READ BACK BY AND VERIFIED WITH: Adelene Idler RN 0142 06/29/19 HNM    Yersinia enterocolitica NOT DETECTED NOT DETECTED Final   Vibrio species NOT DETECTED NOT DETECTED Final   Vibrio cholerae NOT DETECTED NOT DETECTED Final   Enteroaggregative E coli (EAEC) NOT DETECTED NOT DETECTED Final   Enteropathogenic E coli (EPEC) NOT DETECTED NOT DETECTED Final   Enterotoxigenic E coli (ETEC) NOT DETECTED NOT DETECTED Final   Shiga like toxin producing E coli (STEC) NOT DETECTED NOT DETECTED Final   Shigella/Enteroinvasive E coli (EIEC) NOT DETECTED NOT DETECTED Final   Cryptosporidium NOT DETECTED NOT DETECTED Final   Cyclospora cayetanensis NOT DETECTED NOT DETECTED Final   Entamoeba histolytica NOT DETECTED NOT DETECTED Final   Giardia lamblia NOT DETECTED NOT DETECTED Final   Adenovirus F40/41 NOT DETECTED NOT DETECTED Final   Astrovirus NOT DETECTED NOT DETECTED Final   Norovirus GI/GII NOT DETECTED NOT DETECTED Final   Rotavirus A NOT DETECTED NOT DETECTED Final   Sapovirus (I, II, IV, and V) NOT DETECTED NOT DETECTED Final    Comment: Performed at [facility], [address] [place], [place] [address]  C Difficile Quick Screen w PCR reflex     Status: Abnormal   Collection Time: 06/29/19 12:08 AM   Specimen: Stool  Result Value Ref Range Status   C Diff antigen POSITIVE (A) NEGATIVE Final   C Diff toxin NEGATIVE NEGATIVE Final   C Diff interpretation Results are indeterminate. See PCR results.  Final    Comment: Performed at [facility], [address] [place], [place] [address]  Respiratory Panel by RT PCR (  Flu A&B, Covid) - Nasopharyngeal Swab     Status: None   Collection Time: 06/29/19 12:08 AM   Specimen: Nasopharyngeal Swab  Result Value Ref Range Status   SARS Coronavirus 2 by RT PCR NEGATIVE NEGATIVE Final    Comment: (NOTE) SARS-CoV-2 target nucleic acids are NOT DETECTED. The SARS-CoV-2 RNA is generally detectable in upper respiratoy specimens during the acute phase of infection. The lowest concentration of SARS-CoV-2 viral copies this assay can detect is 131 copies/mL. A negative result does not preclude SARS-Cov-2 infection and should not be used as the sole basis for treatment or other patient management decisions. A negative result may occur with  improper specimen collection/handling, submission of specimen other than nasopharyngeal swab, presence of viral mutation(s) within the areas targeted by this assay, and inadequate number of viral copies (<131 copies/mL). A negative result must be combined with clinical observations, patient history, and epidemiological information. The expected result is Negative. Fact Sheet for Patients:  PinkCheek.be Fact Sheet for Healthcare Providers:  GravelBags.it This test is not yet ap proved or cleared by the [place] FDA and  has been authorized for detection and/or diagnosis of SARS-CoV-2 by FDA under an Emergency Use Authorization (EUA). This EUA will remain  in effect (meaning this test can be used) for  the duration of the COVID-19 declaration under Section 564(b)(1) of the Act, 21 U.S.C. section 360bbb-3(b)(1), unless the authorization is terminated or revoked sooner.    Influenza A by PCR NEGATIVE NEGATIVE Final   Influenza B by PCR NEGATIVE NEGATIVE Final    Comment: (NOTE) The Xpert Xpress SARS-CoV-2/FLU/RSV assay is intended as an aid in  the diagnosis of influenza from Nasopharyngeal swab specimens and  should not be used as a sole basis for treatment. Nasal washings and  aspirates are unacceptable for Xpert Xpress SARS-CoV-2/FLU/RSV  testing. Fact Sheet for Patients: PinkCheek.be Fact Sheet for Healthcare Providers: GravelBags.it This test is not yet approved or cleared by the [place] FDA and  has been authorized for detection and/or diagnosis of SARS-CoV-2 by  FDA under an Emergency Use Authorization (EUA). This EUA will remain  in effect (meaning this test can be used) for the duration of the  Covid-19 declaration under Section 564(b)(1) of the Act, 21  U.S.C. section 360bbb-3(b)(1), unless the authorization is  terminated or revoked. Performed at [facility], [address], [place], [place] [address]   C. Diff by PCR, Reflexed     Status: Abnormal   Collection Time: 06/29/19 12:08 AM  Result Value Ref Range Status   Toxigenic C. Difficile by PCR POSITIVE (A) NEGATIVE Final    Comment: Positive for toxigenic C. difficile with little to no toxin production. Only treat if clinical presentation suggests symptomatic illness. Performed at [facility], [address], [place], [place] [address]   Urine Culture     Status: Abnormal   Collection Time: 06/29/19 12:39 PM   Specimen: Urine, Catheterized  Result Value Ref Range Status   Specimen Description   Final    URINE, CATHETERIZED Performed at [facility], [address] [place], [place] [address]    Special Requests    Final    NONE Performed at [facility], [address] [place], [place] [address]    Culture (A)  Final    <10,000 COLONIES/mL INSIGNIFICANT GROWTH Performed at [facility], [place] [address] [place], [place] [address]    Report Status 06/30/2019 FINAL  Final  CULTURE, BLOOD (  ROUTINE X 2) w Reflex to ID Panel     Status: None   Collection Time: 06/29/19  3:04 PM   Specimen: BLOOD  Result Value Ref Range Status   Specimen Description BLOOD BLOOD RIGHT HAND  Final   Special Requests   Final    BOTTLES DRAWN AEROBIC ONLY Blood Culture results may not be optimal due to an inadequate volume of blood received in culture bottles   Culture   Final    NO GROWTH 5 DAYS Performed at [facility], [address] [place], [place] [address]    Report Status 07/04/2019 FINAL  Final  Culture, blood (Routine X 2) w Reflex to ID Panel     Status: None   Collection Time: 06/29/19 11:01 PM   Specimen: BLOOD  Result Value Ref Range Status   Specimen Description BLOOD BLOOD RIGHT HAND  Final   Special Requests   Final    BOTTLES DRAWN AEROBIC AND ANAEROBIC Blood Culture results may not be optimal due to an inadequate volume of blood received in culture bottles   Culture   Final    NO GROWTH 5 DAYS Performed at [facility], [address] [place], [place] [address]    Report Status 07/04/2019 FINAL  Final  Stool culture (children & immunocomp patients)     Status: None   Collection Time: 06/30/19  8:39 PM   Specimen: Stool  Result Value Ref Range Status   Salmonella/Shigella Screen LA11  Final    Comment: (NOTE) Test not performed. Deterioration occurred during specimen handling.      Yvonne Kendall notified  07/05/2019    Campylobacter Culture LA11  Final    Comment: (NOTE) Test not performed. Deterioration occurred during specimen handling.      Gerald Stabs H notified  07/05/2019    E coli, Shiga toxin Assay [id number]  Final    Comment: (NOTE) Test not performed.  Deterioration occurred during specimen handling.      Yvonne Kendall notified  07/05/2019 Performed At: [facility] [place], [place] [id number] Rush Farmer MD HF:[id number]          Radiology Studies: No results found.      Scheduled Meds: . amLODipine  10 mg Oral Daily  . apixaban  2.5 mg Oral Q12H  . aspirin EC  81 mg Oral Daily  . [START ON 07/07/2019] benazepril  40 mg Oral Daily  . escitalopram  15 mg Oral Daily  . finasteride  5 mg Oral Daily  . fluconazole  150 mg Oral Daily  . fluticasone  2 spray Each Nare Daily  . gabapentin  400 mg Oral BID  . insulin aspart  0-20 Units Subcutaneous TID WC  . insulin aspart  0-5 Units Subcutaneous QHS  . insulin aspart  6 Units Subcutaneous TID WC  . insulin glargine  20 Units Subcutaneous QHS  . latanoprost  1 drop Both Eyes QHS  . nystatin cream   Topical BID  . pantoprazole (PROTONIX) IV  40 mg Intravenous QHS  . psyllium  1 packet Oral Daily  . tamsulosin  0.4 mg Oral Daily  . vancomycin  125 mg Oral Q6H   Continuous Infusions: . sodium chloride Stopped (07/06/19 0311)  . cefTRIAXone (ROCEPHIN)  IV Stopped (07/06/19 0101)  . metronidazole 500 mg (07/06/19 0836)     LOS: 7 days    Time spent: 35 min    Sidney Ace, MD [facility] Pager [phone]  If 7PM-7AM, please contact night-coverage 07/06/2019, 1:44 PM

## 2019-07-06 NOTE — Progress Notes (Signed)
ID Says he has had 2 loose stools since morning ont type 5 and one type 6 documented- color brown  Patient Vitals for the past 24 hrs:  BP Temp Temp src Pulse Resp SpO2  07/06/19 1509 (!) 170/59 98.3 F (36.8 C) Oral -- -- 93 %  07/06/19 0741 (!) 188/60 98.2 F (36.8 C) Oral -- 18 94 %  07/06/19 0057 (!) 156/59 98.1 F (36.7 C) Oral (!) 59 18 94 %   Awake and alert No discomfort  CBC Latest Ref Rng & Units 07/03/2019 07/02/2019 07/01/2019  WBC 4.0 - 10.5 K/uL 9.4 7.7 5.3  Hemoglobin 13.0 - 17.0 g/dL 12.6(L) 12.2(L) 12.3(L)  Hematocrit 39.0 - 52.0 % 38.1(L) 36.6(L) 37.8(L)  Platelets 150 - 400 K/uL 183 194 170    CMP Latest Ref Rng & Units 07/05/2019 07/04/2019 07/03/2019  Glucose 70 - 99 mg/dL 139(H) 158(H) 176(H)  BUN 8 - 23 mg/dL 30(H) 29(H) 28(H)  Creatinine 0.61 - 1.24 mg/dL 1.66(H) 1.82(H) 1.93(H)  Sodium 135 - 145 mmol/L 140 140 137  Potassium 3.5 - 5.1 mmol/L 3.6 2.9(L) 3.3(L)  Chloride 98 - 111 mmol/L 106 105 107  CO2 22 - 32 mmol/L 26 26 20(L)  Calcium 8.9 - 10.3 mg/dL 8.9 8.8(L) 8.7(L)  Total Protein 6.5 - 8.1 g/dL - - -  Total Bilirubin 0.3 - 1.2 mg/dL - - -  Alkaline Phos 38 - 126 U/L - - -  AST 15 - 41 U/L - - -  ALT 0 - 44 U/L - - -    Impression/recommendation  Salmonella gastroenteritis/dysentry- finishing 7 days of Iv ceftriaxone- improving  AKI- improving   Cdiff colitis- improving on IV flagyl and pO vanco- will be able to stop IV flagyl tomorrow and continue only vanco $RemoveBe'125mg'NdFvjSqit$  Po Q 6 until 07/12/19  Discussed the management with patient and daughter ID will sign off. Call if needed

## 2019-07-07 LAB — BASIC METABOLIC PANEL
Anion gap: 8 (ref 5–15)
BUN: 31 mg/dL — ABNORMAL HIGH (ref 8–23)
CO2: 27 mmol/L (ref 22–32)
Calcium: 8.9 mg/dL (ref 8.9–10.3)
Chloride: 105 mmol/L (ref 98–111)
Creatinine, Ser: 1.49 mg/dL — ABNORMAL HIGH (ref 0.61–1.24)
GFR calc Af Amer: 52 mL/min — ABNORMAL LOW (ref >60)
GFR calc non Af Amer: 45 mL/min — ABNORMAL LOW (ref >60)
Glucose, Bld: 177 mg/dL — ABNORMAL HIGH (ref 70–99)
Potassium: 3 mmol/L — ABNORMAL LOW (ref 3.5–5.1)
Sodium: 140 mmol/L (ref 135–145)

## 2019-07-07 LAB — CBC WITH DIFFERENTIAL/PLATELET
Abs Immature Granulocytes: 0.14 10*3/uL — ABNORMAL HIGH (ref 0.00–0.07)
Basophils Absolute: 0.1 10*3/uL (ref 0.0–0.1)
Basophils Relative: 1 %
Eosinophils Absolute: 0.3 10*3/uL (ref 0.0–0.5)
Eosinophils Relative: 4 %
HCT: 38.5 % — ABNORMAL LOW (ref 39.0–52.0)
Hemoglobin: 12.8 g/dL — ABNORMAL LOW (ref 13.0–17.0)
Immature Granulocytes: 2 %
Lymphocytes Relative: 16 %
Lymphs Abs: 1.4 10*3/uL (ref 0.7–4.0)
MCH: 29.5 pg (ref 26.0–34.0)
MCHC: 33.2 g/dL (ref 30.0–36.0)
MCV: 88.7 fL (ref 80.0–100.0)
Monocytes Absolute: 0.8 10*3/uL (ref 0.1–1.0)
Monocytes Relative: 9 %
Neutro Abs: 6.1 10*3/uL (ref 1.7–7.7)
Neutrophils Relative %: 68 %
Platelets: 318 10*3/uL (ref 150–400)
RBC: 4.34 MIL/uL (ref 4.22–5.81)
RDW: 13.2 % (ref 11.5–15.5)
WBC: 8.8 10*3/uL (ref 4.0–10.5)
nRBC: 0 % (ref 0.0–0.2)

## 2019-07-07 LAB — GLUCOSE, CAPILLARY
Glucose-Capillary: 180 mg/dL — ABNORMAL HIGH (ref 70–99)
Glucose-Capillary: 239 mg/dL — ABNORMAL HIGH (ref 70–99)

## 2019-07-07 MED ORDER — INSULIN GLARGINE 100 UNIT/ML SOLOSTAR PEN: 20.0000 [IU] | PEN_INJECTOR | Freq: Every day | SUBCUTANEOUS | 11 refills | Status: DC

## 2019-07-07 MED ORDER — POTASSIUM CHLORIDE CRYS ER 20 MEQ PO TBCR
40.0000 meq | EXTENDED_RELEASE_TABLET | Freq: Once | ORAL | Status: AC
  Administered 2019-07-07: 40 meq via ORAL
  Filled 2019-07-07: qty 2

## 2019-07-07 MED ORDER — VANCOMYCIN 50 MG/ML ORAL SOLUTION: 125.0000 mg | Freq: Four times a day (QID) | ORAL | 0 refills | Status: AC

## 2019-07-07 MED ORDER — FUROSEMIDE 20 MG PO TABS: ORAL_TABLET | ORAL | Status: DC

## 2019-07-07 MED ORDER — HYDRALAZINE HCL 50 MG PO TABS
25.0000 mg | ORAL_TABLET | Freq: Three times a day (TID) | ORAL | Status: DC
  Administered 2019-07-07: 12:00:00 25 mg via ORAL
  Filled 2019-07-07: qty 1

## 2019-07-07 MED ORDER — PANTOPRAZOLE SODIUM 40 MG PO TBEC: 40.0000 mg | DELAYED_RELEASE_TABLET | Freq: Every day | ORAL | Status: DC

## 2019-07-07 MED ORDER — HYDRALAZINE HCL 25 MG PO TABS: 25.0000 mg | ORAL_TABLET | Freq: Three times a day (TID) | ORAL | 0 refills | Status: DC

## 2019-07-07 MED ORDER — BENAZEPRIL HCL 20 MG PO TABS: 20.0000 mg | ORAL_TABLET | Freq: Every day | ORAL | 2 refills | Status: DC

## 2019-07-07 NOTE — TOC Progression Note (Addendum)
Transition of Care [facility]) - Progression Note    Patient Details  Name: Derek Andersen MRN: [id number] Date of Birth: 11-09-1940  Transition of Care [facility]) CM/SW Contact  Madelin Weseman, Gardiner Rhyme, LCSW Phone Number: 07/07/2019, 9:39 AM  Clinical Narrative:   Spoke with pt via telephone to discuss again discharge plan. He does not have 24 hr care at home, he has 4 hours in the am and son comes by daily. His wife has a incurable lung disease and is on O2 and followed by Hospice. He has been to SNF's before and feels he can manage at home. Aware team recommends SNF at [place]. He is adamantly against this. Will work on home health follow up and see if nay other equipment needs. He has a lift chair and rollator. Continue to work on a safe discharge plan. Pt reports PT to come in today and get him up.  10:00 [facility] will take the referral and provide HHPT and OT at home.  Expected Discharge Plan: [place] Barriers to Discharge: Continued Medical Work up  Expected Discharge Plan and Services Expected Discharge Plan: [place] In-house Referral: Clinical Social Work   Post Acute Care Choice: Home Health, [place], [place] Living arrangements for the past 2 months: Single Family Home                                       Social Determinants of Health (SDOH) Interventions    Readmission Risk Interventions No flowsheet data found.

## 2019-07-07 NOTE — Progress Notes (Signed)
PHARMACIST - PHYSICIAN COMMUNICATION   CONCERNING: IV to Oral Route Change Policy  RECOMMENDATION: This patient is receiving Protonix by the intravenous route.  Based on criteria approved by the Pharmacy and Therapeutics Committee, the intravenous medication(s) is/are being converted to the equivalent oral dose form(s).  DESCRIPTION: These criteria include:  The patient is eating (either orally or via tube) and/or has been taking other orally administered medications for a least 24 hours  The patient has no evidence of active gastrointestinal bleeding or impaired GI absorption (gastrectomy, short bowel, patient on TNA or NPO).  If you have questions about this conversion, please contact the [place], PharmD, BCPS Clinical Pharmacist 07/07/2019 10:16 AM

## 2019-07-07 NOTE — Progress Notes (Signed)
IV removed before discharge. Went over discharge instructions and medications. All questions were answered and the patient stated that he did not have any questions. Patient being discharged home with daughter via POV.

## 2019-07-07 NOTE — Progress Notes (Signed)
Physical Therapy Treatment Patient Details Name: Derek Andersen MRN: [id number] DOB: 04-Dec-1940 Today's Date: 07/07/2019    History of Present Illness Pt is 79 y/o M with PMH: A. fib on Eliquis, CAD, CKD 4, DM, anemia of CKD, BPH and OSA. Pt presented to ED with a 3-day history of diarrhea and vomiting associated with weakness. Found to be positive for C.Diff and Salmonella. Of note: rectal tube removed 4/26.    PT Comments    Pt was long sitting in bed upon arriving. He agrees to PT session and is cooperative and motivated throughout. C/o "minimal 2-3/10 pain." and is excited to get OOB. Therapist discussed DC disposition and pt verbalizes not willing to go to rehab. " I can do what I need to do from home.I have a caregiver that helps me. I want to do home PT." Pt was able to exit L side of bed via roll to short sit. Incraesed time to perform with min-mod assist to achieve full upright EOB short sitting. BP in sitting 146/78. He then was able to stand with min assist from elevated bed height with cueing for technique and hand placement. Pt has knee buckling and reports it has been this way for more than a year. He was only able to ambulate 5 ft to recliner with gait belt + min assist for safety. Pt is high fall risk 2/2 to strength deficits and knee buckling. Pt was slightly SOB with minimal activity however vitals stable on rm air. Therapist discussed the importance of performing there ex throughout the day to promote strengthening. Therapist continues to recommend pt DC to SNF for safety however pt is not willing. He has WC at home and feels he can safely manage with HHPT and personal caregiver assistance. Acute PT will continue to follow per current POC and progress pt as able. He was seated in recliner with BLEs elevated and call bell in reach at conclusion of session. RN aware of pt's abilities and deficits.    Follow Up Recommendations  SNF     Equipment Recommendations  Rolling walker  with 5" wheels    Recommendations for Other Services       Precautions / Restrictions Precautions Precautions: Fall Restrictions Weight Bearing Restrictions: No    Mobility  Bed Mobility Overal bed mobility: Needs Assistance Bed Mobility: Supine to Sit   Sidelying to sit: Min assist Supine to sit: Min assist;Mod assist     General bed mobility comments: modified log roll with pt using  bedrails, required min assist + 1 with increased time to perform.  Transfers Overall transfer level: Needs assistance Equipment used: Rolling walker (2 wheeled) Transfers: Sit to/from Stand Sit to Stand: Min assist;From elevated surface         General transfer comment: Pt was able to STS from EOB with min assist from slightly elevated bed height. Vcs for improved technique. Pt verbalizes how surprised he was with weakness. In static standing, slight knee buckling present."My knees have been buckling for years."  Ambulation/Gait Ambulation/Gait assistance: Min assist Gait Distance (Feet): 5 Feet Assistive device: Rolling walker (2 wheeled)   Gait velocity: decreased   General Gait Details: Pt was able to ambulate from EOB to recliner ~ 5 ft however required min assist for safety. Gait belt used throughout. PT has poor gait posture and fatigues quickly with minimal ambulation. He reports he uses rollator at home. therapist educated pt on importance to use standard RW until strength and mobility improved. He agrees.  Stairs             Wheelchair Mobility    Modified Rankin (Stroke Patients Only)       Balance Overall balance assessment: Needs assistance Sitting-balance support: Feet supported Sitting balance-Leahy Scale: Good Sitting balance - Comments: no LOB seated EOB without UE support   Standing balance support: Bilateral upper extremity supported;During functional activity Standing balance-Leahy Scale: Poor Standing balance comment: Pt is at high fall risk 2/2 to  knee buckling present. He depends heavily on RW for support.                            Cognition Arousal/Alertness: Awake/alert Behavior During Therapy: WFL for tasks assessed/performed Overall Cognitive Status: Within Functional Limits for tasks assessed                                 General Comments: Pt was  and O x 4 and agreeable to PT session. He repeatedly states he is not going to go to SNF at DC and wants to go straight home with assistance from personal caregiver      Exercises      General Comments        Pertinent Vitals/Pain Pain Assessment: 0-10 Pain Score: 4  Faces Pain Scale: Hurts a little bit Pain Location: abdomen Pain Descriptors / Indicators: Grimacing Pain Intervention(s): Limited activity within patient's tolerance;Monitored during session;Premedicated before session    Home Living                      Prior Function            PT Goals (current goals can now be found in the care plan section) Acute Rehab PT Goals Patient Stated Goal: To go home without rehab Progress towards PT goals: Progressing toward goals    Frequency    Min 2X/week      PT Plan Current plan remains appropriate    Co-evaluation              AM-PAC PT "6 Clicks" Mobility   Outcome Measure  Help needed turning from your back to your side while in a flat bed without using bedrails?: A Lot Help needed moving from lying on your back to sitting on the side of a flat bed without using bedrails?: A Lot Help needed moving to and from a bed to a chair (including a wheelchair)?: A Lot Help needed standing up from a chair using your arms (e.g., wheelchair or bedside chair)?: A Lot Help needed to walk in hospital room?: A Lot Help needed climbing 3-5 steps with a railing? : Total 6 Click Score: 11    End of Session Equipment Utilized During Treatment: Gait belt Activity Tolerance: Patient tolerated treatment well Patient left: in  chair;with chair alarm set;with call bell/phone within reach Nurse Communication: Mobility status(therapist discussed return to assist pt back to bed) PT Visit Diagnosis: Other abnormalities of gait and mobility (R26.89);Muscle weakness (generalized) (M62.81);Difficulty in walking, not elsewhere classified (R26.2)     Time: 0102-7253 PT Time Calculation (min) (ACUTE ONLY): 17 min  Charges:  $Therapeutic Activity: 8-22 mins                     Julaine Fusi PTA 07/07/19, 10:56 AM

## 2019-07-07 NOTE — TOC Transition Note (Signed)
Transition of Care [facility]) - CM/SW Discharge Note   Patient Details  Name: Derek Andersen MRN: [id number] Date of Birth: 1940-07-06  [facility] CM/SW Contact:  Elease Hashimoto, LCSW Phone Number: 07/07/2019, 12:06 PM   Clinical Narrative:  MD feels pt is medically stable for discharge today. Pt insists on going home and not to a SNF. He states: "This is not my first rodeo, I will manage at home." [facility] set up to provide HHPT and OT and pt has all needed equipment from 11 back surgeries. Still concerns regarding him going home and not having 24 hr care but pt is capable of making his own decisions and insists on going home from the hospital. Pt felt he could contact his family and this worker did not need too. No further follow due to DC later today.    Final next level of care: [place] Barriers to Discharge: Barriers Resolved   Patient Goals and CMS Choice Patient states their goals for this hospitalization and ongoing recovery are:: I want to go home and not no facility. Let's see how I do in the next few days CMS Medicare.gov Compare Post Acute Care list provided to:: Patient Choice offered to / list presented to : Patient  Discharge Placement                Patient to be transferred to facility by: family via car Name of family member notified: son Patient and family notified of of transfer: 07/07/19  Discharge Plan and Services In-house Referral: Clinical Social Work   Post Acute Care Choice: Home Health, Museum/gallery conserv[facility], Skilled Nursing Facility                    HH Arranged: PT, OT [facility] Agency: [facility] Date [facility] Agency Contacted: 07/07/19 Time [place] Agency Contacted: 1000 Representative spoke with at [place]: [place] ([place]) Interventions     Readmission Risk Interventions No flowsheet data found.

## 2019-07-07 NOTE — Care Management Important Message (Signed)
Important Message  Patient Details  Name: Derek Andersen MRN: [id number] Date of Birth: 1941-01-12   Medicare Important Message Given:  Yes  Have given to pt  Elease Hashimoto, LCSW 07/07/2019, 11:04 AM

## 2019-07-07 NOTE — Discharge Summary (Signed)
Physician Discharge Summary   Derek Andersen  male DOB: 05-25-1940  EZM:[id number]  PCP: Derinda Late, MD  Admit date: 06/28/2019 Discharge date: 07/07/2019  Admitted From: home Disposition:  home PT/OT consult: rec SNF.  Patient did not wish to go to skilled nursing facility.  Home Health: Yes CODE STATUS: Full code  Discharge Instructions    Diet - low sodium heart healthy   Complete by: As directed    Discharge instructions   Complete by: As directed    You have finished IV antibiotics for your colon infection.  Please continue to take oral Vanc until 07/12/19 as directed on your prescription.  Your blood pressure has been persistently high in 170's-180's in the hospital.  We have increased your Benazepril to 40 mg daily and added Hydralazine 25 mg 3 times per day.  Please follow up with your PCP for further blood pressure medication adjustment.  Your long-acting insulin Lantus has been reduced to 20u nightly. - -   Increase activity slowly   Complete by: As directed        Hospital Course:  For full details, please see H&P, progress notes, consult notes and ancillary notes.  Briefly,  Derek Fraise McAlisteris a 79 y.o.malewith medical history significant forA. fib on Eliquis, CAD, CKD 4, DM, anemia of CKD, BPH and OSA who presented to the emergency room with a 3-day history of diarrhea and vomiting associated with weakness. He denied fever, chills or abdominal pain. Denies chest pain or shortness of breath. His vomit was yellow gastric contents, nonbloody nonbilious and no coffee grounds. Diarrhea was watery and yellow, no blood in the stool no black stool. No affected contacts.  Acute Salmonella gastroenteritis C. difficile toxin positive antigen negative 3 days of nausea vomiting.  Unclear trigger, possibly chicken salad versus fish sandwich. CT abd- fluid filled colon, not distended, stranding consistent with colitis.  Pt was started on oral Vanco and  Rocephin (8 days total), and IV flagyl added by ID (7 days total).  Pt was clinically improving, and was discharged on only vancomycin p.o. 500 q6h until 07/12/19, per ID rec.  Symptomatic anemia Possible GI bleed Stool guaiac positive.  Hgb was 6.1 on presentation, so pt was transfused 2u pRBC in the ED, with resulting Hgb rise to 13.7.  Hgb remained stable in 12's throughout the rest of hospitalization.  The initial low Hgb of 6.1 was likely a lab error.  GI consulted, no plans for scope, FOBT secondary to acute infectious colitis.    Acute renal failure, POA, resolved CKD 3a Cr 3.19 on presentation, likely due to dehydration.  Pt received IV hydration with improvement in Cr, which was 1.49 on the day of discharge.  Chronic anticoagulation with Eliquis Atrial fibrillation, chronic (HCC) Eliquis restarted on 4/22, no bleeding noted  CAD (coronary artery disease) No chest pain.  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 difficile colitis  Discharge Instructions:  Allergies as of 07/07/2019      Reactions   Latex    Oxycodone       Medication List    TAKE these medications   acetaminophen 325 MG tablet Commonly known as: TYLENOL Take 325 mg by mouth every 6 (six) hours as needed.   amLODipine 10 MG tablet Commonly known as: NORVASC Take 10 mg by mouth daily.   aspirin 81 MG  EC tablet Take 81 mg by mouth daily.   benazepril 20 MG tablet Commonly known as: LOTENSIN Take 1 tablet (20 mg total) by mouth daily. Increased from your previous 20 mg daily. What changed: additional instructions   calcium elemental as carbonate 400 MG chewable tablet Commonly known as: BARIATRIC TUMS ULTRA Chew 1 tablet by mouth every 2 (two) hours.   Cholecalciferol 25 MCG (1000 UT) capsule Take 1,000 Units by mouth daily.   Docosahexaenoic Acid 200 MG Caps Take 200 mg by mouth in the morning and at bedtime.   Eliquis 2.5 MG Tabs tablet Generic drug: apixaban Take 2.5 mg by mouth every 12 (twelve) hours.   escitalopram 10 MG tablet Commonly known as: LEXAPRO Take 15 mg by mouth daily.   fenofibrate 160 MG tablet Take 80 mg by mouth daily.   ferrous sulfate 325 (65 FE) MG tablet Take 325 mg by mouth daily.   finasteride 5 MG tablet Commonly known as: PROSCAR Take 5 mg by mouth daily.   fluticasone 50 MCG/ACT nasal spray Commonly known as: FLONASE Place 2 sprays into both nostrils daily.   furosemide 20 MG tablet Commonly known as: LASIX Hold until PCP followup due to diarrhea and AKI. What changed:   how much to take  how to take this  when to take this  additional instructions   gabapentin 800 MG tablet Commonly known as: NEURONTIN Take 400 mg by mouth 2 (two) times daily.   hydrALAZINE 25 MG tablet Commonly known as: APRESOLINE Take 1 tablet (25 mg total) by mouth every 8 (eight) hours. New blood pressure medication.   hydrocortisone 2.5 % cream Apply 1 application topically See admin instructions. Apply to the affected scaly areas on face twice daily as needed.   insulin glargine 100 UNIT/ML Solostar Pen Commonly known as: LANTUS Inject 20 Units into the skin at bedtime. What changed: how much to take   ketoconazole 2 % shampoo Commonly known as: NIZORAL Apply 1 application topically as directed.   Klor-Con M20 20 MEQ tablet Generic  drug: potassium chloride SA Take 20 mEq by mouth 2 (two) times daily.   latanoprost 0.005 % ophthalmic solution Commonly known as: XALATAN Place 1 drop into both eyes at bedtime.   NovoLOG FlexPen 100 UNIT/ML FlexPen Generic drug: insulin aspart Inject 40-60 Units into the skin 3 (three) times daily before meals. INJECT INTO THE SKIN 50 UNITS AM,  40 UNITS LUNCH, AND 50 - 60 UNITS  WITH SUPPER   pantoprazole 40 MG tablet Commonly known as: PROTONIX Take 40 mg by mouth daily.   senna-docusate 8.6-50 MG tablet Commonly known as: Senokot-S Take 2 tablets by mouth at bedtime.   tamsulosin 0.4 MG Caps capsule Commonly known as: FLOMAX Take 0.4 mg by mouth daily.   vancomycin 50 mg/mL  oral solution Commonly known as: VANCOCIN Take 2.5 mLs (125 mg total) by mouth every 6 (six) hours for 5 days.       Follow-up Information    Derinda Late, MD. Schedule an appointment as soon as  possible for a visit in 1 week(s).   Specialty: Family Medicine Contact information: [address] [facility] [place] [address] [phone]           Allergies  Allergen Reactions  . Latex   . Oxycodone      The results of significant diagnostics from this hospitalization (including imaging, microbiology, ancillary and laboratory) are listed below for reference.   Consultations:   Procedures/Studies: CT ABDOMEN PELVIS WO CONTRAST  Result Date: 07/01/2019 CLINICAL DATA:  Infectious gastroenteritis or colitis, salmonella, C difficile toxin positive, nausea, vomiting EXAM: CT ABDOMEN AND PELVIS WITHOUT CONTRAST TECHNIQUE: Multidetector CT imaging of the abdomen and pelvis was performed following the standard protocol without IV contrast. COMPARISON:  None. FINDINGS: Lower chest: Small left, trace right pleural effusions and associated atelectasis or consolidation. Three-vessel coronary artery calcifications and/or stents. Hepatobiliary: No solid  liver abnormality is seen. Gallstones near the gallbladder neck. No gallbladder wall thickening. No biliary ductal dilatation Pancreas: Unremarkable. No pancreatic ductal dilatation or surrounding inflammatory changes. Spleen: Normal in size without significant abnormality. Adrenals/Urinary Tract: Adrenal glands are unremarkable. Atrophic appearing kidneys bilaterally. There is a 1.4 cm calculus near the left ureteropelvic junction without hydronephrosis. Additional nonobstructive calculi in the inferior pole of the left kidney. Bladder is unremarkable. Stomach/Bowel: Stomach is within normal limits. Appendix appears normal. The colon is fluid-filled throughout although nondilated, with mild fat stranding primarily noted about the transverse and proximal descending colon (series 2, image 46). Occasional sigmoid diverticula rectal tube. Vascular/Lymphatic: Aortic atherosclerosis. No enlarged abdominal or pelvic lymph nodes. Reproductive: No mass or other significant abnormality. Other: Fat containing umbilical hernia. Small volume fluid about the left paracolic gutter. Musculoskeletal: Status post extensive posterior thoracic and lumbar laminectomy and fusion with a probable seromatous fluid collection overlying the L4 through upper sacral levels (series 9, image 85). 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 IMPRESSION: 1. The colon is fluid-filled throughout although nondilated, with mild fat stranding primarily noted about the transverse and proximal descending colon. Findings are generally in keeping with infectious or inflammatory colitis. Rectal tube is in position. 2. Small volume fluid in the left paracolic gutter, likely reactive. 3. There is a 1.4 cm calculus near the left ureteropelvic junction. No hydronephrosis. Additional nonobstructive calculi in the inferior pole of the left kidney. 4.  Cholelithiasis. 5. Bilateral pleural effusions and associated atelectasis or consolidation. Electronically Signed    By: Eddie Candle M.D.   On: 07/01/2019 13:17   US RENAL  Result Date: 06/07/2019 CLINICAL DATA:  Initial evaluation for chronic kidney disease, stage IV. Proteinuria. EXAM: RENAL / URINARY TRACT ULTRASOUND COMPLETE COMPARISON:  None available. FINDINGS: Right Kidney: Renal measurements: 12.4 x 6.5 x 6.7 cm = volume: 285 mL. Severe diffuse cortical thinning with increased echogenicity within the renal parenchyma. No nephrolithiasis or hydronephrosis. No focal renal mass. Left Kidney: Renal measurements: 13.2 x 6.8 x 5.7 cm = volume: 269 mL. Severe diffuse cortical thinning with increased echogenicity within the renal parenchyma. No nephrolithiasis or hydronephrosis. No focal renal mass. Bladder: Appears normal for degree of bladder distention. Bilateral jets are visualized. Other: None. IMPRESSION: 1. Severe diffuse cortical thinning with increased echogenicity within the renal parenchyma, compatible with chronic medical renal disease. 2. No hydronephrosis or other significant finding. Electronically Signed   By: Jeannine Boga M.D.   On: 06/07/2019 16:23   DG Chest Port 1 View  Result Date: 07/03/2019 CLINICAL DATA:  Diarrhea and vomiting.  Difficulty breathing. EXAM: PORTABLE CHEST 1 VIEW COMPARISON:  June 29, 2019 FINDINGS: Stable left retrocardiac opacity. The cardiomediastinal silhouette is stable. No pneumothorax. No nodules or masses. Mild pulmonary venous congestion not excluded. No overt edema. IMPRESSION: 1. Stable left retrocardiac opacity. Mild pulmonary venous congestion. No other acute abnormalities. Electronically Signed   By: Dorise Bullion III M.D   On: 07/03/2019 13:06   DG Chest Portable 1 View  Result Date: 06/29/2019 CLINICAL DATA:  Hypoxia. EXAM: PORTABLE CHEST 1 VIEW COMPARISON:  Most recent radiograph 11/05/2017 FINDINGS: Mild cardiomegaly. Unchanged mediastinal contours. Nonspecific retrocardiac opacity. No focal airspace disease in the right lung. No pulmonary edema or  pneumothorax. Thoracolumbar spinal fusion hardware. Hardware in the lower cervical spine is partially included. IMPRESSION: 1. Retrocardiac opacity which is nonspecific and not well evaluated. The may simply represent soft tissue attenuation from habitus and overlapping structures, however possibility of atelectasis/airspace disease and/or pleural effusion is raised. Consider follow-up PA and lateral views when patient is able. 2. Mild cardiomegaly. Electronically Signed   By: Keith Rake M.D.   On: 06/29/2019 01:00      Labs: BNP (last 3 results) No results for input(s): BNP in the last 8760 hours. Basic Metabolic Panel: Recent Labs  Lab 07/02/19 0632 07/03/19 0537 07/04/19 0458 07/05/19 0633 07/07/19 0447  NA 138 137 140 140 140  K 3.3* 3.3* 2.9* 3.6 3.0*  CL 107 107 105 106 105  CO2 21* 20* $Remov'26 26 27  'lkgjuI$ GLUCOSE 168* 176* 158* 139* 177*  BUN 39* 28* 29* 30* 31*  CREATININE 2.37* 1.93* 1.82* 1.66* 1.49*  CALCIUM 8.5* 8.7* 8.8* 8.9 8.9   Liver Function Tests: No results for input(s): AST, ALT, ALKPHOS, BILITOT, PROT, ALBUMIN in the last 168 hours. No results for input(s): LIPASE, AMYLASE in the last 168 hours. No results for input(s): AMMONIA in the last 168 hours. CBC: Recent Labs  Lab 07/01/19 0756 07/02/19 0632 07/03/19 0537 07/07/19 0447  WBC 5.3 7.7 9.4 8.8  NEUTROABS 3.9 5.8 7.3 6.1  HGB 12.3* 12.2* 12.6* 12.8*  HCT 37.8* 36.6* 38.1* 38.5*  MCV 91.3 89.7 90.9 88.7  PLT 170 194 183 318   Cardiac Enzymes: No results for input(s): CKTOTAL, CKMB, CKMBINDEX, TROPONINI in the last 168 hours. BNP: Invalid input(s): POCBNP CBG: Recent Labs  Lab 07/06/19 0739 07/06/19 1147 07/06/19 1621 07/06/19 2059 07/07/19 0801  GLUCAP 164* 183* 279* 251* 180*   D-Dimer No results for input(s): DDIMER in the last 72 hours. Hgb A1c No results for input(s): HGBA1C in the last 72 hours. Lipid Profile No results for input(s): CHOL, HDL, LDLCALC, TRIG, CHOLHDL, LDLDIRECT in  the last 72 hours. Thyroid function studies No results for input(s): TSH, T4TOTAL, T3FREE, THYROIDAB in the last 72 hours.  Invalid input(s): FREET3 Anemia work up No results for input(s): VITAMINB12, FOLATE, FERRITIN, TIBC, IRON, RETICCTPCT in the last 72 hours. Urinalysis    Component Value Date/Time   COLORURINE YELLOW (A) 06/28/2019 2002   APPEARANCEUR HAZY (A) 06/28/2019 2002   LABSPEC 1.017 06/28/2019 2002   PHURINE 5.0 06/28/2019 2002   GLUCOSEU 50 (A) 06/28/2019 2002   HGBUR MODERATE (A) 06/28/2019 2002   BILIRUBINUR NEGATIVE 06/28/2019 2002   [place] NEGATIVE 06/28/2019 2002   PROTEINUR 100 (A) 06/28/2019 2002   NITRITE NEGATIVE 06/28/2019 2002   LEUKOCYTESUR TRACE (A) 06/28/2019 2002   Sepsis Labs Invalid input(s): PROCALCITONIN,  WBC,  LACTICIDVEN Microbiology Recent Results (from the past 240 hour(s))  Gastrointestinal Panel by PCR , Stool  Status: Abnormal   Collection Time: 06/29/19 12:08 AM   Specimen: Stool  Result Value Ref Range Status   Campylobacter species NOT DETECTED NOT DETECTED Final   Plesimonas shigelloides NOT DETECTED NOT DETECTED Final   Salmonella species DETECTED (A) NOT DETECTED Final    Comment: RESULT CALLED TO, READ BACK BY AND VERIFIED WITH: Adelene Idler RN [id number] 06/29/19 HNM    Yersinia enterocolitica NOT DETECTED NOT DETECTED Final   Vibrio species NOT DETECTED NOT DETECTED Final   Vibrio cholerae NOT DETECTED NOT DETECTED Final   Enteroaggregative E coli (EAEC) NOT DETECTED NOT DETECTED Final   Enteropathogenic E coli (EPEC) NOT DETECTED NOT DETECTED Final   Enterotoxigenic E coli (ETEC) NOT DETECTED NOT DETECTED Final   Shiga like toxin producing E coli (STEC) NOT DETECTED NOT DETECTED Final   Shigella/Enteroinvasive E coli (EIEC) NOT DETECTED NOT DETECTED Final   Cryptosporidium NOT DETECTED NOT DETECTED Final   Cyclospora cayetanensis NOT DETECTED NOT DETECTED Final   Entamoeba histolytica NOT DETECTED NOT DETECTED Final    Giardia lamblia NOT DETECTED NOT DETECTED Final   Adenovirus F40/41 NOT DETECTED NOT DETECTED Final   Astrovirus NOT DETECTED NOT DETECTED Final   Norovirus GI/GII NOT DETECTED NOT DETECTED Final   Rotavirus A NOT DETECTED NOT DETECTED Final   Sapovirus (I, II, IV, and V) NOT DETECTED NOT DETECTED Final    Comment: Performed at [facility], [address] [place], [place] [address]  C Difficile Quick Screen w PCR reflex     Status: Abnormal   Collection Time: 06/29/19 12:08 AM   Specimen: Stool  Result Value Ref Range Status   C Diff antigen POSITIVE (A) NEGATIVE Final   C Diff toxin NEGATIVE NEGATIVE Final   C Diff interpretation Results are indeterminate. See PCR results.  Final    Comment: Performed at [facility], [address] [place], [place] [address]  Respiratory Panel by RT PCR (Flu A&B, Covid) - Nasopharyngeal Swab     Status: None   Collection Time: 06/29/19 12:08 AM   Specimen: Nasopharyngeal Swab  Result Value Ref Range Status   SARS Coronavirus 2 by RT PCR NEGATIVE NEGATIVE Final    Comment: (NOTE) SARS-CoV-2 target nucleic acids are NOT DETECTED. The SARS-CoV-2 RNA is generally detectable in upper respiratoy specimens during the acute phase of infection. The lowest concentration of SARS-CoV-2 viral copies this assay can detect is 131 copies/mL. A negative result does not preclude SARS-Cov-2 infection and should not be used as the sole basis for treatment or other patient management decisions. A negative result may occur with  improper specimen collection/handling, submission of specimen other than nasopharyngeal swab, presence of viral mutation(s) within the areas targeted by this assay, and inadequate number of viral copies (<131 copies/mL). A negative result must be combined with clinical observations, patient history, and epidemiological information. The expected result is Negative. Fact Sheet for Patients:   PinkCheek.be Fact Sheet for Healthcare Providers:  GravelBags.it This test is not yet ap proved or cleared by the [place] FDA and  has been authorized for detection and/or diagnosis of SARS-CoV-2 by FDA under an Emergency Use Authorization (EUA). This EUA will remain  in effect (meaning this test can be used) for the duration of the COVID-19 declaration under Section 564(b)(1) of the Act, 21 U.S.C. section 360bbb-3(b)(1), unless the authorization is terminated or revoked sooner.    Influenza A by PCR NEGATIVE NEGATIVE Final   Influenza B by PCR NEGATIVE NEGATIVE Final  Comment: (NOTE) The Xpert Xpress SARS-CoV-2/FLU/RSV assay is intended as an aid in  the diagnosis of influenza from Nasopharyngeal swab specimens and  should not be used as a sole basis for treatment. Nasal washings and  aspirates are unacceptable for Xpert Xpress SARS-CoV-2/FLU/RSV  testing. Fact Sheet for Patients: PinkCheek.be Fact Sheet for Healthcare Providers: GravelBags.it This test is not yet approved or cleared by the [place] FDA and  has been authorized for detection and/or diagnosis of SARS-CoV-2 by  FDA under an Emergency Use Authorization (EUA). This EUA will remain  in effect (meaning this test can be used) for the duration of the  Covid-19 declaration under Section 564(b)(1) of the Act, 21  U.S.C. section 360bbb-3(b)(1), unless the authorization is  terminated or revoked. Performed at [facility], [address] [place], [place] [address]   C. Diff by PCR, Reflexed     Status: Abnormal   Collection Time: 06/29/19 12:08 AM  Result Value Ref Range Status   Toxigenic C. Difficile by PCR POSITIVE (A) NEGATIVE Final    Comment: Positive for toxigenic C. difficile with little to no toxin production. Only treat if clinical presentation suggests symptomatic  illness. Performed at [facility], [address], [place], [place] [address]   Urine Culture     Status: Abnormal   Collection Time: 06/29/19 12:39 PM   Specimen: Urine, Catheterized  Result Value Ref Range Status   Specimen Description   Final    URINE, CATHETERIZED Performed at [facility], [address] [place], [place] [address]    Special Requests   Final    NONE Performed at [facility], [address] [place], [place] [address]    Culture (A)  Final    <10,000 COLONIES/mL INSIGNIFICANT GROWTH Performed at [facility], [place] [address] [place], [place] [address]    Report Status 06/30/2019 FINAL  Final  CULTURE, BLOOD (ROUTINE X 2) w Reflex to ID Panel     Status: None   Collection Time: 06/29/19  3:04 PM   Specimen: BLOOD  Result Value Ref Range Status   Specimen Description BLOOD BLOOD RIGHT HAND  Final   Special Requests   Final    BOTTLES DRAWN AEROBIC ONLY Blood Culture results may not be optimal due to an inadequate volume of blood received in culture bottles   Culture   Final    NO GROWTH 5 DAYS Performed at [facility], [address] [place], [place] [address]    Report Status 07/04/2019 FINAL  Final  Culture, blood (Routine X 2) w Reflex to ID Panel     Status: None   Collection Time: 06/29/19 11:01 PM   Specimen: BLOOD  Result Value Ref Range Status   Specimen Description BLOOD BLOOD RIGHT HAND  Final   Special Requests   Final    BOTTLES DRAWN AEROBIC AND ANAEROBIC Blood Culture results may not be optimal due to an inadequate volume of blood received in culture bottles   Culture   Final    NO GROWTH 5 DAYS Performed at [facility], [address], [place], [place] [address]    Report Status 07/04/2019 FINAL  Final  Stool culture (children & immunocomp patients)     Status: None   Collection Time: 06/30/19  8:39 PM   Specimen: Stool  Result Value Ref Range Status   Salmonella/Shigella  Screen LA11  Final    Comment: (NOTE) Test not performed. Deterioration occurred during  specimen handling.      Yvonne Kendall notified  07/05/2019    Campylobacter Culture LA11  Final    Comment: (NOTE) Test not performed. Deterioration occurred during specimen handling.      Gerald Stabs H notified  07/05/2019    E coli, Shiga toxin Assay [id number]  Final    Comment: (NOTE) Test not performed. Deterioration occurred during specimen handling.      Yvonne Kendall notified  07/05/2019 Performed At: [facility] [place], [place] [id number] Rush Farmer MD TC:[id number]      Total time spend on discharging this patient, including the last patient exam, discussing the hospital stay, instructions for ongoing care as it relates to all pertinent caregivers, as well as preparing the medical discharge records, prescriptions, and/or referrals as applicable, is 40 minutes.    Enzo Bi, MD  [facility] 07/07/2019, 10:50 AM  If 7PM-7AM, please contact night-coverage

## 2019-07-09 LAB — STOOL CULTURE

## 2019-07-09 LAB — STOOL CULTURE REFLEX - CMPCXR

## 2019-07-09 LAB — STOOL CULTURE REFLEX - RSASHR

## 2020-11-21 DIAGNOSIS — R45851 Suicidal ideations: Secondary | ICD-10-CM

## 2020-11-21 HISTORY — DX: Suicidal ideations: R45.851

## 2021-01-01 ENCOUNTER — Other Ambulatory Visit: Payer: Self-pay

## 2021-01-01 ENCOUNTER — Encounter: Payer: Self-pay | Admitting: Urology

## 2021-01-01 ENCOUNTER — Ambulatory Visit: Payer: Medicare HMO | Admitting: Urology

## 2021-01-01 VITALS — BP 125/63 | HR 78 | Ht 66.0 in | Wt 234.0 lb

## 2021-01-01 DIAGNOSIS — N200 Calculus of kidney: Secondary | ICD-10-CM

## 2021-01-01 DIAGNOSIS — N471 Phimosis: Secondary | ICD-10-CM

## 2021-01-01 NOTE — Progress Notes (Signed)
   01/01/21 10:40 AM   Derek Andersen September 12, 1940 [id number]  CC: Phimosis, nephrolithiasis  HPI: Very comorbid 80 year old male with obesity and BMI of 38, diabetes, CKD with creatinine 1.8(eGFR 38) referred for phimosis.  On my review he also had a large left 1 cm UPJ stone on CT in April 2021, and I do not see that this was ever followed by urology.  He reports some difficulty with urinary stream and retracting the foreskin, though he is minimally bothered by this.  He denies any gross hematuria or dysuria.  He denies any flank pain.  Reviewed, see HPI   PMH: Past Medical History:  Diagnosis Date   Arthritis    Atrial fibrillation ([place])    Diabetes mellitus without complication (HCC)    GERD (gastroesophageal reflux disease)    Hypertension    MI (myocardial infarction) ([place])    Neuropathy     Surgical History: Past Surgical History:  Procedure Laterality Date   BACK SURGERY     CORONARY ANGIOPLASTY WITH STENT PLACEMENT     Family History: No family history on file.  Social History:  reports that he has never smoked. He has never used smokeless tobacco. He reports that he does not currently use alcohol. He reports that he does not currently use drugs.  Physical Exam: BP 125/63   Pulse 78   Ht $R'5\' 6"'ht$  (1.676 m)   Wt 234 lb (106.1 kg)   BMI 37.77 kg/m    Constitutional:  Alert and oriented, No acute distress. Cardiovascular: No clubbing, cyanosis, or edema. Respiratory: Normal respiratory effort, no increased work of breathing. GI: Abdomen is soft, nontender, nondistended, no abdominal masses GU: Uncircumcised phallus with patent meatus, foreskin easily retractable, no suspicious lesions, obese with large suprapubic fat pad  Laboratory Data: Reviewed, see HPI  Pertinent Imaging: I have personally viewed and interpreted the CT from April 2021 that shows a large left 1.4 cm UPJ stone, and 1 cm left lower pole stone.  Assessment & Plan:   80 year old male with  mild phimosis that is minimally bothersome, as well as CKD and 1.4 cm UPJ stone seen on CT in April 2021 that was never followed by urology.  I discouraged him from pursuing circumcision or dorsal slit with his mild symptoms, and high risk for infection with his diabetes and overall poor health.  I think his suprapubic fat pad and obesity is likely a larger contributor to his trouble with urination and spraying stream.  We discussed that I am much more concerned about the large left UPJ stone that was seen over a year and a half ago on CT and never followed up, and that this could contribute to his CKD and worsening renal function.  I recommended repeating a CT stone protocol, and considering left ureteroscopy, laser lithotripsy, stent placement if persistent UPJ stone and renal parenchyma salvageable.  CT stone protocol, virtual visit 2 weeks to discuss results   Nickolas Madrid, MD 01/01/2021  [facility] [address] [place], [place] [address] [phone]

## 2021-01-08 ENCOUNTER — Ambulatory Visit: Payer: Self-pay | Admitting: Urology

## 2021-01-16 ENCOUNTER — Ambulatory Visit
Admission: RE | Admit: 2021-01-16 | Discharge: 2021-01-16 | Disposition: A | Discharge status: Home / Self Care | Payer: Medicare HMO | Source: Ambulatory Visit | Attending: Urology | Admitting: Urology

## 2021-01-16 ENCOUNTER — Other Ambulatory Visit: Payer: Self-pay

## 2021-01-16 ENCOUNTER — Telehealth: Payer: Medicare HMO | Admitting: Urology

## 2021-01-16 ENCOUNTER — Other Ambulatory Visit: Payer: Self-pay | Admitting: Urology

## 2021-01-16 DIAGNOSIS — N200 Calculus of kidney: Secondary | ICD-10-CM | POA: Insufficient documentation

## 2021-01-16 NOTE — Progress Notes (Signed)
[facility] Posting Form   Surgery Date/Time: Date: 02/09/2021  Surgeon: Dr. 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

## 2021-01-16 NOTE — Progress Notes (Signed)
Surgical Physician Order Form  * Scheduling expectation : Next Available  *Length of Case: 1.5 hours  *Clearance needed: no  *Anticoagulation Instructions: May continue all anticoagulants  *Aspirin Instructions: Ok to continue all  *Post-op visit Date/Instructions: TBD   *Diagnosis: Left Nephrolithiasis  *Procedure: Ureteroscopy w/laser lithotripsy & stent placement/exchange (83094)  -Admit type: OUTpatient  -Anesthesia: General  -VTE Prophylaxis Standing Order SCD's       Other:   -Standing Lab Orders Per Anesthesia    Lab other: UA&Urine Culture  -Standing Test orders EKG/Chest x-ray per Anesthesia       Test other:   - Medications:     Ancef 2gm IV   Other Instructions:

## 2021-01-16 NOTE — Progress Notes (Signed)
Virtual Visit via Telephone Note  I connected with Derek Andersen on 01/16/21 at 11:15 AM EST by telephone and verified that I am speaking with the correct person using two identifiers.   Patient location: Home Provider location: [facility]   I discussed the limitations, risks, security and privacy concerns of performing an evaluation and management service by telephone and the availability of in person appointments. We discussed the impact of the COVID-19 pandemic on the healthcare system, and the importance of social distancing and reducing patient and provider exposure. I also discussed with the patient that there may be a patient responsible charge related to this service. The patient expressed understanding and agreed to proceed.  Reason for visit: Left renal stone  History of Present Illness: Comorbid 80 year old male with CKD and creatinine of 1.8, EGFR less than 40 who has had a large 1.5 cm left renal stone for at least 1.5 years.  This was redemonstrated on recent CT today showing a 1.5 cm left renal stone without hydronephrosis.  I suspect this is contributing to his CKD.  We discussed options including observation, shockwave lithotripsy, left ureteroscopy/laser lithotripsy, or percutaneous nephrolithotomy.  I think based on his comorbidities, obesity, and stone size, he would be the best candidate for left ureteroscopy, laser lithotripsy, and stent placement. We specifically discussed the risks ureteroscopy including bleeding, infection/sepsis, stent related symptoms including flank pain/urgency/frequency/incontinence/dysuria, ureteral injury, inability to access stone, or need for staged or additional procedures.   Follow Up: Schedule left ureteroscopy, laser lithotripsy, stent placement   I discussed the assessment and treatment plan with the patient. The patient was provided an opportunity to ask questions and all were answered. The patient agreed with the plan  and demonstrated an understanding of the instructions.   The patient was advised to call back or seek an in-person evaluation if the symptoms worsen or if the condition fails to improve as anticipated.  I provided 13 minutes of non-face-to-face time during this encounter.   Billey Co, MD

## 2021-01-29 ENCOUNTER — Other Ambulatory Visit: Payer: Self-pay

## 2021-01-29 DIAGNOSIS — N200 Calculus of kidney: Secondary | ICD-10-CM

## 2021-01-30 ENCOUNTER — Other Ambulatory Visit
Admission: RE | Admit: 2021-01-30 | Discharge: 2021-01-30 | Disposition: A | Discharge status: Home / Self Care | Payer: Medicare HMO | Source: Ambulatory Visit | Attending: Urology | Admitting: Urology

## 2021-01-30 ENCOUNTER — Other Ambulatory Visit: Payer: Medicare HMO

## 2021-01-30 ENCOUNTER — Other Ambulatory Visit: Payer: Self-pay

## 2021-01-30 DIAGNOSIS — N200 Calculus of kidney: Secondary | ICD-10-CM

## 2021-01-30 HISTORY — DX: Malignant (primary) neoplasm, unspecified: C80.1

## 2021-01-30 HISTORY — DX: Sleep apnea, unspecified: G47.30

## 2021-01-30 NOTE — Patient Instructions (Addendum)
Your procedure is scheduled on: Friday February 09, 2021. Report to Day Surgery inside [facility] 2nd floor. To find out your arrival time please call [phone] between 1PM - 3PM on Thursday February 08, 2021.  Remember: Instructions that are not followed completely may result in serious medical risk,  up to and including death, or upon the discretion of your surgeon and anesthesiologist your  surgery may need to be rescheduled.     _X__ 1. Do not eat food or drink fluids after midnight the night before your procedure.                 No chewing gum or hard candies.   __X__2.  On the morning of surgery brush your teeth with toothpaste and water, you                may rinse your mouth with mouthwash if you wish.  Do not swallow any toothpaste or mouthwash.     _X__ 3.  No Alcohol for 24 hours before or after surgery.   _X__ 4.  Do Not Smoke or use e-cigarettes For 24 Hours Prior to Your Surgery.                 Do not use any chewable tobacco products for at least 6 hours prior to                 Surgery.  _X__  5.  Do not use any recreational drugs (marijuana, cocaine, heroin, ecstasy, MDMA or other)                For at least one week prior to your surgery.  Combination of these drugs with anesthesia                May have life threatening results.  _X___ 6.  Notify your doctor if there is any change in your medical condition      (cold, fever, infections).     Do not wear jewelry, make-up, hairpins, clips or nail polish. Do not wear lotions, powders, or perfumes. You may wear deodorant. Do not shave 48 hours prior to surgery. Men may shave face and neck. Do not bring valuables to the hospital.    [facility] is not responsible for any belongings or valuables.  Contacts, dentures or bridgework may not be worn into surgery. Leave your suitcase in the car. After surgery it may be brought to your room. For patients admitted to the hospital, discharge  time is determined by your treatment team.   Patients discharged the day of surgery will not be allowed to drive home.   Make arrangements for someone to be with you for the first 24 hours of your Same Day Discharge.   __X__ Take these medicines the morning of surgery with A SIP OF WATER:    1. amLODipine (NORVASC) 10 MG  2. buPROPion (WELLBUTRIN XL) 150 MG  3. escitalopram (LEXAPRO) 10 MG   4. finasteride (PROSCAR) 5 MG  5. gabapentin (NEURONTIN) 300 MG  6. hydrALAZINE (APRESOLINE) 25 MG  7. pantoprazole (PROTONIX) 40 MG  8. tamsulosin (FLOMAX) 0.4 MG   ____ Fleet Enema (as directed)   ____ Use CHG Soap (or wipes) as directed  ____ Use Benzoyl Peroxide Gel as instructed  ____ Use inhalers on the day of surgery  ____ Stop metformin 2 days prior to surgery    __X__ Take 1/2 of Insulin Glargine (BASAGLAR KWIKPEN) 100 UNIT/ML  usual insulin  dose the night before surgery.  No insulin the morning of surgery.   ____ Call your PCP, cardiologist, or Pulmonologist if taking Coumadin/Plavix/aspirin and ask when to stop before your surgery.   __X__ One Week prior to surgery- Stop Anti-inflammatories such as Ibuprofen, Aleve, Advil, Motrin, meloxicam (MOBIC), diclofenac, etodolac, ketorolac, Toradol, Daypro, piroxicam, Goody's or BC powders. OK TO USE TYLENOL IF NEEDED   __X__ one week prior to surgery stop ALL supplements until after surgery. Omega-3 Fatty Acids (FISH OIL)   ____ Bring C-Pap to the hospital.    If you have any questions regarding your pre-procedure instructions,  Please call Pre-admit Testing at [phone].

## 2021-01-31 ENCOUNTER — Encounter: Payer: Self-pay | Admitting: Urology

## 2021-01-31 NOTE — Progress Notes (Incomplete)
Perioperative Services  Pre-Admission/Anesthesia Testing Clinical Review  Date: 01/31/21  Patient Demographics:  Name: Derek Andersen DOB:   March 30, 1940 MRN:   [id number]  Planned Surgical Procedure(s):    Case: [id number] Date/Time: 02/09/21 0715   Procedure: CYSTOSCOPY/URETEROSCOPY/HOLMIUM LASER/STENT PLACEMENT (Left)   Anesthesia type: General   Pre-op diagnosis: Left Nephrolithiasis   Location: [facility] OR ROOM 10 / [facility]   Surgeons: Billey Co, MD   NOTE: Available PAT nursing documentation and vital signs have been reviewed. Clinical nursing staff has updated patient's PMH/PSHx, current medication list, and drug allergies/intolerances to ensure comprehensive history available to assist in medical decision making as it pertains to the aforementioned surgical procedure and anticipated anesthetic course. Extensive review of available clinical information performed. [place] PMH and PSHx updated with any diagnoses/procedures that  may have been inadvertently omitted during his intake with the pre-admission testing department's nursing staff.  Clinical Discussion:  Derek Andersen is a 80 y.o. male who is submitted for pre-surgical anesthesia review and clearance prior to him undergoing the above procedure. Patient has never been a smoker/***Patient is a {Smoking Status:21012044} (*** pack years; quit ***). Pertinent PMH includes: ***  Patient is followed by cardiology (***, MD). He was last seen in the cardiology clinic on ***; notes reviewed. ***  ***Patient with a PMH significant for ***. CHA2DS2-VASc Score = ***. Patient chronically anticoagulated using ***; compliant with therapy with no evidence of GI bleeding. Last TTE performed on *** revealed normal left ventricular systolic function with an EF of ***%.  Subsequent myocardial perfusion imaging study performed on ***revealed no evidence of stress-induced myocardial ischemia or arrhythmia (see full  interpretation of cardiovascular testing below). Patient on GDMT for his HTN and HLD diagnoses.  Blood pressure well controlled at *** on currently prescribed *** therapies.  Patient is on a statin for his HLD. T2DM *** controlled on currently prescribed regimen; Hgb A1c ***% when last checked on ***. Functional capacity, as defined by [facility]ASI, is documented as being >/= 4 METS.  No changes were made to patient's medication regimen.  Patient to follow-up with outpatient cardiology in *** months or sooner if needed.  [name] is scheduled for an CYSTOSCOPY/URETEROSCOPY/HOLMIUM LASER/STENT PLACEMENT (Left) on *** with Dr. Marland Kitchen  Given patient's past medical history significant for cardiovascular diagnoses, presurgical cardiac clearance was sought by the performing surgeon's office and ***PAT team. ***.  This patient {ACTION; IS/IS RFF:63846659} on daily ***anticoagulation ***antiplatelet therapy. He has been instructed on recommendations for holding his *** for {NUMBER 1-10:22536} days prior to his procedure with plans to restart as soon as postoperative bleeding risk felt to be minimized by his attending surgeon. The patient has been instructed that his last dose of his anticoagulant will be on ***.  Patient {Actions; denies-reports:120008} previous perioperative complications with anesthesia in the past. In review of the available records, it is noted that patient underwent a *** anesthetic course *** (ASA {Roman numeral i-vi:30201}) in *** without documented complications.   Vitals with BMI 01/30/2021 01/01/2021 07/07/2019  Height $Remov'5\' 6"'zvJCoc$  $Remove'5\' 6"'qkTrNFA$  -  Weight 235 lbs 234 lbs -  BMI 93.57 01.77 -  Systolic - 939 030  Diastolic - 63 60  Pulse - 78 -    Providers/Specialists:   NOTE: Primary physician provider listed below. Patient may have been seen by APP or partner within same practice.   PROVIDER ROLE / SPECIALTY LAST OV  Billey Co, MD ***  Baldemar Lenis,  Berna Spare, MD Primary Care Provider   ***  *** Cardiology  ***  *** ***  ***   Allergies:  Latex and Oxycodone  Current Home Medications:   No current facility-administered medications for this encounter.    acetaminophen (TYLENOL) 325 MG tablet   amLODipine (NORVASC) 10 MG tablet   aspirin 81 MG EC tablet   benazepril (LOTENSIN) 40 MG tablet   brimonidine (ALPHAGAN) 0.2 % ophthalmic solution   buPROPion (WELLBUTRIN XL) 150 MG 24 hr tablet   calcium carbonate (TUMS EX) 750 MG chewable tablet   Cholecalciferol 25 MCG (1000 UT) capsule   diclofenac Sodium (VOLTAREN) 1 % GEL   ELIQUIS 2.5 MG TABS tablet   escitalopram (LEXAPRO) 10 MG tablet   ferrous sulfate 325 (65 FE) MG tablet   finasteride (PROSCAR) 5 MG tablet   furosemide (LASIX) 20 MG tablet   gabapentin (NEURONTIN) 300 MG capsule   hydrocortisone 2.5 % cream   Insulin Glargine (BASAGLAR KWIKPEN) 100 UNIT/ML   ketoconazole (NIZORAL) 2 % shampoo   magnesium oxide (MAG-OX) 400 MG tablet   NOVOLOG FLEXPEN 100 UNIT/ML FlexPen   Omega-3 Fatty Acids (FISH OIL) 1200 MG CAPS   pantoprazole (PROTONIX) 40 MG tablet   pravastatin (PRAVACHOL) 40 MG tablet   senna-docusate (SENOKOT-S) 8.6-50 MG tablet   tamsulosin (FLOMAX) 0.4 MG CAPS capsule   TRULICITY 0.75 MG/0.5ML SOPN   hydrALAZINE (APRESOLINE) 25 MG tablet   insulin glargine (LANTUS) 100 UNIT/ML Solostar Pen   History:   Past Medical History:  Diagnosis Date   Arthritis    Atrial fibrillation (HCC)    a.) CHA2DS2-VASc = 7 (age x 2, HTN, DVT, prior MI, T2DM). b.) chronically anticoagulated using apixaban   BPH (benign prostatic hyperplasia)    CAD (coronary artery disease)    a.) MI 1985. b.) LHC 02/17/2002: 25% RCA, 25% LM, 75% OM1; PCI performed placing a Medtronic stent to OM1. c.) LHC 08/17/2002: < 25% OM1, 25% RI, 25% LAD; no intervention. d.) LHC 11/23/2013: report unavailable; referred to CVTS. 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   a.) single vessel MIDCAB; LIMA-LAD   Skin cancer    a.) hands and back   Spinal stenosis    T2DM (type 2 diabetes mellitus) (HCC)    Past Surgical History:  Procedure Laterality Date   ANKLE SURGERY Right 1989   plate in ankle   BACK SURGERY     11x Degernerative disk   CARDIAC CATHETERIZATION Left 08/17/2002   Procedure: CARDIAC CATHETERIZATION; Location: [facility]   CARDIAC CATHETERIZATION Left 11/23/2013   Procedure: CARDIAC CATHETERIZATION; Location: [facility]   CORONARY ANGIOPLASTY WITH STENT PLACEMENT Left 02/17/2002   Procedure: CORONARY ANGIOPLASTY WITH STENT PLACEMENT (Medtronic stent to OM1); Location: [facility]   MINIMALLY INVASIVE DIRECT CORONARY ARTERY BYPASS (MIDCAB; LIMA -LAD) Left 11/29/2013   Procedure: MINIMALLY INVASIVE DIRECT CORONARY ARTERY BYPASS (MIDCAB; LIMA -LAD); Location: [facility]   No family history on file. Social History   Tobacco Use  Smoking status: Never   Smokeless tobacco: Never  Vaping Use   Vaping Use: Never used  Substance Use Topics   Alcohol use: Not Currently   Drug use: Not Currently    Pertinent Clinical Results:  LABS: {CHL AN LABS REVIEWED:112001::"Labs reviewed: Acceptable for surgery."}  Appointment on 01/30/2021  Component Date Value Ref Range Status   Specific Gravity, UA 01/30/2021 1.020  1.005 - 1.030 Final   pH, UA 01/30/2021 6.0  5.0 - 7.5 Final   Color, UA 01/30/2021 Yellow  Yellow Final   Appearance Ur 01/30/2021 Clear  Clear Final   Leukocytes,UA 01/30/2021 Negative  Negative Final   Protein,UA 01/30/2021 3+ (A)  Negative/Trace Final   Glucose,  UA 01/30/2021 Negative  Negative Final   Ketones, UA 01/30/2021 Trace (A)  Negative Final   RBC, UA 01/30/2021 Trace (A)  Negative Final   Bilirubin, UA 01/30/2021 Negative  Negative Final   Urobilinogen, Ur 01/30/2021 0.2  0.2 - 1.0 mg/dL Final   Nitrite, UA 01/30/2021 Negative  Negative Final   Microscopic Examination 01/30/2021 See below:   Final   WBC, UA 01/30/2021 6-10 (A)  0 - 5 /hpf Final   RBC 01/30/2021 0-2  0 - 2 /hpf Final   Epithelial Cells (non renal) 01/30/2021 0-10  0 - 10 /hpf Final   Renal Epithel, UA 01/30/2021 None seen  None seen /hpf Final   Casts 01/30/2021 Present (A)  None seen /lpf Final   Cast Type 01/30/2021 Hyaline casts  N/A Final   Crystals 01/30/2021 None seen  N/A Final   Crystal Type 01/30/2021 WILL FOLLOW   Preliminary   Mucus, UA 01/30/2021 WILL FOLLOW   Preliminary   Bacteria, UA 01/30/2021 WILL FOLLOW   Preliminary   Yeast, UA 01/30/2021 WILL FOLLOW   Preliminary   Trichomonas, UA 01/30/2021 WILL FOLLOW   Preliminary   Urinalysis Comments 01/30/2021 WILL FOLLOW   Preliminary    ECG: Date: 01/31/2021 Time ECG obtained: *** {Time; am/pm:31393} Rate: *** bpm Rhythm: {CHL RHYTHM BASELINE EKG FOR SUO:15615379} Axis (leads I and aVF): {Left-right-normal:60277::"***"} Normal (up/up).Marland KitchenMarland KitchenLEFT axis (up/down)...RIGHT axis (down/up).Marland KitchenMarland KitchenEXTREME axis (down/down) Intervals: PR *** ms. QRS *** ms. QTc *** ms. ST segment and T wave changes: No evidence of acute ST segment elevation or depression Comparison: Similar to previous tracing obtained on *** No previous tracings available for review and comparison.   IMAGING / PROCEDURES: ***  Impression and Plan:  GERSON FAUTH has been referred for pre-anesthesia review and clearance prior to him undergoing the planned anesthetic and procedural courses. Available labs, pertinent testing, and imaging results were personally reviewed by me. This patient has been appropriately cleared by cardiology with an overall  *** risk of significant perioperative cardiovascular complications.  Based on clinical review performed today (01/31/21), barring any significant acute changes in the patient's overall condition, it is anticipated that he will be able to proceed with the planned surgical intervention. Any acute changes in clinical condition may necessitate his procedure being postponed and/or cancelled. Patient will meet with anesthesia team (MD and/or CRNA) on the day of his procedure for preoperative evaluation/assessment. Questions regarding anesthetic course will be fielded at that time.   Pre-surgical instructions were reviewed with the patient during his PAT appointment and questions were fielded by PAT clinical staff. Patient was advised that if any questions or concerns arise prior to his procedure then he should return a call to PAT and/or his surgeon's office to discuss.  Honor Loh, MSN, APRN, FNP-C, CEN  [facility]  Peri-operative Services Nurse Practitioner Phone: [phone] Fax: [phone] 01/31/21 2:35 PM  NOTE: This note has been prepared using Dragon dictation software. Despite my best ability to proofread, there is always the potential that unintentional transcriptional errors may still occur from this process.

## 2021-02-01 LAB — URINALYSIS, COMPLETE
Bilirubin, UA: NEGATIVE
Glucose, UA: NEGATIVE
Leukocytes,UA: NEGATIVE
Nitrite, UA: NEGATIVE
Specific Gravity, UA: 1.02 (ref 1.005–1.030)
Urobilinogen, Ur: 0.2 mg/dL (ref 0.2–1.0)
pH, UA: 6 (ref 5.0–7.5)

## 2021-02-01 LAB — MICROSCOPIC EXAMINATION
Bacteria, UA: NONE SEEN
Crystals: NONE SEEN
Renal Epithel, UA: NONE SEEN /hpf

## 2021-02-03 LAB — CULTURE, URINE COMPREHENSIVE

## 2021-02-05 ENCOUNTER — Encounter: Payer: Self-pay | Admitting: Urology

## 2021-02-05 NOTE — Progress Notes (Addendum)
Perioperative Services  Pre-Admission/Anesthesia Testing Clinical Review  Date: 02/08/21  Patient Demographics:  Name: Derek Andersen DOB:   08-Apr-1940 MRN:   [id number]  Planned Surgical Procedure(s):    Case: [id number] Date/Time: 02/09/21 0715   Procedure: CYSTOSCOPY/URETEROSCOPY/HOLMIUM LASER/STENT PLACEMENT (Left)   Anesthesia type: General   Pre-op diagnosis: Left Nephrolithiasis   Location: [facility] OR ROOM 10 / [facility]   Surgeons: Billey Co, MD   NOTE: Available PAT nursing documentation and vital signs have been reviewed. Clinical nursing staff has updated patient's PMH/PSHx, current medication list, and drug allergies/intolerances to ensure comprehensive history available to assist in medical decision making as it pertains to the aforementioned surgical procedure and anticipated anesthetic course. Extensive review of available clinical information performed. [place] PMH and PSHx updated with any diagnoses/procedures that  may have been inadvertently omitted during his intake with the pre-admission testing department's nursing staff.  Clinical Discussion:  Derek Andersen is a 80 y.o. male who is submitted for pre-surgical anesthesia review and clearance prior to him undergoing the above procedure. Patient has never been a smoker. Pertinent PMH includes: CAD (s/p MIDCAB), MI, atrial fibrillation, DVT, HTN, HLD, T2DM, CKD-IV, pulmonary hypertension, OSAH (requires nocturnal PAP therapy), GERD (on daily PPI), anemia, OA, DISH, spinal stenosis with neurogenic claudication, postlaminectomy syndrome, gait instability, BPH, nephrolithiasis, suicidal ideations (gun in home; no verbalized plan).   Patient is followed by cardiology Edwin Dada, MD). He was last seen in the cardiology clinic on 07/21/2020; notes reviewed.  At the time of his clinic visit, patient doing well overall from a cardiovascular perspective.  He denied any episodes of chest pain.  Patient  with chronic shortness of breath related to his underlying pulmonary hypertension diagnosis.  Patient has slept in a recliner for over 17 years and notes that he is unable to fully recline.  He denied palpitations, vertiginous symptoms, or presyncope/syncope.  Patient with intermittent peripheral edema for which she takes Lasix.  PMH significant for cardiovascular diagnoses.  Patient suffered an MI on 12/31/1983.  Diagnostic left heart catheterization performed at that time revealed total occlusion of the LAD with 95% stenosis of the proximal LAD-1, 100% stenosis of the proximal LAD-2, and 50% stenosis of the mid LAD. Intervention was deferred opting for medical management.  Since that time, patient has undergone multiple diagnostic left heart catheterizations:  Date Findings  Intervention  05/08/1988 EF 55%. CAD: 12 pRCA, 95% mLAD Deferred; medical management  02/17/2002 CAD: 25% RCA, 25% LM. 75% OM1 0.25 x 12 mm Medtronic stent to OM1  08/17/2002 CAD: < 25% OM1, 25% RI, 100% pLAD Deferred; medical management  09/02/2003 EF 47%. CAD: 100% pLAD, 75% mLAD-1,  50% mLAD-2, <25% pRCA, <25% LM, 25% pLCx, <25% OM2, 25% RI Deferred; medical management  11/23/2013 CAD: 100% LAD Referred to CVTS for CABG   Patient underwent single-vessel MIDCAB procedure on 11/29/2013.  LIMA-LAD bypass graft was placed.  Procedure complicated by postoperative atrial fibrillation.  Patient underwent TEE with cardioversion on 12/06/2013 whereby she received 200 J cardioversions x 2 restoring NSR.  Stress echocardiogram was performed on 05/18/2018 revealing an LVEF of >55%.  There was trivial tricuspid valve regurgitation.  There was no evidence of stress-induced myocardial ischemia.  Stress ECG revealed both atrial and ventricular ectopy with no significant arrhythmias.  PMH also significant for atrial fibrillation; CHA2DS2-VASc Score = 7 (age x 2, HTN, DVT x 2, prior MI, T2DM). Rate and rhythm maintained without  pharmacological intervention following TEE with  cardioversion (200 J x 2) performed on 12/06/2013. Patient is chronically anticoagulated with apixaban; dose reduced due to age and significant renal insufficiency. She is compliant with anticoagulation therapy with no evidence or reports of GI bleeding.  Blood pressure elevated at 161/66 on currently prescribed CCB, ACEi, and diuretic therapies.  He is on a statin + omega-3 fatty acid for his HLD and further ASCVD prevention.  T2DM well-controlled on currently prescribed regimen; last Hgb A1c was 5.5% when checked on 10/23/2020.  Patient with an OSAH diagnosis; compliant with prescribed nocturnal PAP therapy.  Functional capacity limited by deconditioning, age-related debility, fear of falling/gait instability, and overall cardiopulmonary status; unable to achieve 4 METS of activity without experiencing angina/anginal equivalent symptoms.  No changes were made to his medication regimen.  Patient to follow-up with outpatient cardiology and 6 months or sooner if needed.  Derek Andersen is scheduled for an CYSTOSCOPY/URETEROSCOPY/HOLMIUM LASER/STENT PLACEMENT on 02/09/2021 with Dr. Nickolas Madrid, MD.  Given patient's past medical history significant for cardiovascular diagnoses, presurgical cardiac clearance was sought by the PAT team. Per cardiology, "this patient is optimized for surgery and may proceed with the planned procedural course with a MODERATE risk of significant perioperative cardiovascular complications". Again, this patient is on daily apixaban and ASA therapies. He has been instructed on recommendations from his cardiologist for holding his apixaban 3 DOSES prior to his procedure with plans to restart as soon as postoperative bleeding is not minimized by his primary attending surgeon.  Patient will continue his daily low-dose ASA throughout the perioperative period.  Patient denies previous perioperative complications with anesthesia in the past.  In review of the available records, it is noted that patient underwent a general anesthetic course at [facility] in 06/2018 without documented complications.   Vitals with BMI 01/30/2021 01/01/2021 07/07/2019  Height 5' 6"  5' 6"  -  Weight 235 lbs 234 lbs -  BMI 30.14 99.69 -  Systolic - 249 324  Diastolic - 63 60  Pulse - 78 -    Providers/Specialists:   NOTE: Primary physician provider listed below. Patient may have been seen by APP or partner within same practice.   PROVIDER ROLE / SPECIALTY LAST Ranae Pila, MD Urology 01/16/2021  Derinda Late, MD Primary Care Provider 10/30/2020  Cloretta Ned, MD Cardiology 07/21/2020  Lavonia Dana, MD Nephrology 12/18/2020   Allergies:  Latex and Oxycodone  Current Home Medications:   No current facility-administered medications for this encounter.    acetaminophen (TYLENOL) 325 MG tablet   amLODipine (NORVASC) 10 MG tablet   aspirin 81 MG EC tablet   benazepril (LOTENSIN) 40 MG tablet   brimonidine (ALPHAGAN) 0.2 % ophthalmic solution   buPROPion (WELLBUTRIN XL) 150 MG 24 hr tablet   calcium carbonate (TUMS EX) 750 MG chewable tablet   Cholecalciferol 25 MCG (1000 UT) capsule   diclofenac Sodium (VOLTAREN) 1 % GEL   ELIQUIS 2.5 MG TABS tablet   escitalopram (LEXAPRO) 10 MG tablet   ferrous sulfate 325 (65 FE) MG tablet   finasteride (PROSCAR) 5 MG tablet   furosemide (LASIX) 20 MG tablet   gabapentin (NEURONTIN) 300 MG capsule   hydrocortisone 2.5 % cream   Insulin Glargine (BASAGLAR KWIKPEN) 100 UNIT/ML   ketoconazole (NIZORAL) 2 % shampoo   magnesium oxide (MAG-OX) 400 MG tablet   NOVOLOG FLEXPEN 100 UNIT/ML FlexPen   Omega-3 Fatty Acids (FISH OIL) 1200 MG CAPS   pantoprazole (PROTONIX) 40 MG tablet  pravastatin (PRAVACHOL) 40 MG tablet   senna-docusate (SENOKOT-S) 8.6-50 MG tablet   tamsulosin (FLOMAX) 0.4 MG CAPS capsule   TRULICITY 0.35 CY/8.1YH SOPN   hydrALAZINE  (APRESOLINE) 25 MG tablet   insulin glargine (LANTUS) 100 UNIT/ML Solostar Pen   History:   Past Medical History:  Diagnosis Date   Arthritis    Atrial fibrillation (HCC)    a.) CHA2DS2-VASc = 7 (age x 2, HTN, DVT x 2, prior MI, T2DM). b.) s/p TEE with cardioversion (200J x 2) 12/06/2013. c.) chronically anticoagulated using apixaban   BPH (benign prostatic hyperplasia)    CAD (coronary artery disease)    a.) MI 12/31/83 -> LHC: 95% pLAD-1, 100% pLAD-2, 50% mLAD. b.) LHC 05/08/88: 50 pRCA, 95% mLAD. c.) LHC 02/17/02: 25% RCA, 25% LM, 75% OM1; PCI -> 0.25 x 65m Medtronic stent to OM1. d.) LHC 08/17/02: <25% OM1, 25% RI, 25% LAD. e.) LHC 09/02/03: 100% pLAD, 75% mLAD-1, 50% mLAD-2, <25% pRCA, <25% LM, 25% pLCx, <25% OM2, 25% RI. f.) LHC 11/23/13: 100% LAD; ref to CVTS. g.) MIDCAB (LIMA-LAD) 11/29/13   CKD (chronic kidney disease), stage IV (HCC)    Deep vein thrombosis (DVT) of right lower extremity [place]   DISH (diffuse idiopathic skeletal hyperostosis)    Erectile dysfunction    Gait instability    GERD (gastroesophageal reflux disease)    History of 2019 novel coronavirus disease (COVID-19) 11/20/2018   HLD (hyperlipidemia)    Hypertension    IDA (iron deficiency anemia)    Long term current use of anticoagulant    a.) Apixaban   [place]   [place] b.) LHC: 95% pLAD-1, 100% pLAD-2, 50% mLAD; no intervention.   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 b.) gun in the home   [place] 2 diabetes mellitus) [place]    Past Surgical History:  Procedure Laterality Date   ANKLE SURGERY  Right 1989   plate in ankle   BACK SURGERY     11x Degernerative disk   CARDIAC CATHETERIZATION Left 08/17/2002   Procedure: CARDIAC CATHETERIZATION; Location: [facility]; Surgeon: RHyman Bible MD   CARDIAC CATHETERIZATION Left 11/23/2013   Procedure: CARDIAC CATHETERIZATION; Location: [facility]; Surgeon: JRonnald Ramp MD   CARDIAC CATHETERIZATION Left 12/31/1983   Procedure: CARDIAC CATHETERIZATION; Location: [facility]; Surgeon: HDoristine Bosworth MD   CARDIAC CATHETERIZATION Left 05/08/1988   Procedure: CARDIAC CATHETERIZATION; Location: [facility]; Surgeon: RMarthann Schiller MD   CARDIAC CATHETERIZATION Left 09/02/2003   Procedure: CARDIAC CATHETERIZATION; Location: [facility]; Surgeon: MPura Spice MD   CORONARY ANGIOPLASTY WITH STENT PLACEMENT Left 02/17/2002   Procedure: CORONARY ANGIOPLASTY WITH STENT PLACEMENT (0.25 x 12 mm Medtronic stent to OM1); Location: [facility]   MINIMALLY INVASIVE DIRECT CORONARY ARTERY BYPASS (MIDCAB; LIMA -LAD) Left 11/29/2013   Procedure: MINIMALLY INVASIVE DIRECT CORONARY ARTERY BYPASS (MIDCAB; LIMA -LAD); Location: [facility]   No family history on file. Social History   Tobacco Use   Smoking status: Never   Smokeless tobacco: Never  Vaping Use   Vaping Use: Never used  Substance Use Topics  Alcohol use: Not Currently   Drug use: Not Currently    Pertinent Clinical Results:  LABS: Labs reviewed: Acceptable for surgery.   Ref Range & Units 12/13/2020  WBC 3.8 - 10.8 Thousand/uL 6.6   RBC 4.20 - 5.80 Million/uL 4.49   Hemoglobin 13.2 - 17.1 g/dL 13.8   Hematocrit 38.5 - 50.0 % 41.4   MCV 80.0 - 100.0 fL 92.2   MCH 27.0 - 33.0 pg 30.7   MCHC 32.0 - 36.0 g/dL 33.3   RDW 11.0 - 15.0 % 12.8   Platelets 140 - 400 Thousand/uL 212   MPV 7.5 - 12.5 fL 9.5   Neutrophils Absolute 1500 - 7800 cells/uL 4,950   Band Neutrophils Absolute, Manual Count 0 - 750 cells/uL CANCELED   Metamyelocytes Absolute 0 cells/uL CANCELED   Absolute Myelocytes 0 cells/uL CANCELED   Absolute Promyelocytes 0  cells/uL CANCELED   Lymphocytes Absolute 850 - 3900 cells/uL 1,036   Monocytes Absolute 200 - 950 cells/uL 482   Eosinophils Absolute 15 - 500 cells/uL 79   Basophils Absolute 0 - 200 cells/uL 53   Blasts Absolute 0 cells/uL CANCELED   NRBC Absolute 0 cells/uL CANCELED   Neutrophils Relative % 75   Bands Absolute % CANCELED   Metamyelocytes Percent % CANCELED   Myelocytes Relative % CANCELED   Promyelocytes Relative % CANCELED   Lymphocytes % 15.7   Variant lymphocytes/100 WBC (Bld) 0 - 10 % CANCELED   Monocytes % 7.3   Eosinophils % 1.2   Basophils Relative % 0.8   Blasts % CANCELED   nRBC 0 /100 WBC CANCELED   Comment(s)  CANCELED   Resulting Agency  [facility]    Ref Range & Units 12/13/2020  Glucose 65 - 99 mg/dL 147 High    BUN 7 - 25 mg/dL 27 High    Creatinine 0.70 - 1.28 mg/dL 1.81 High    eGFR CKD-EPI CR 2021 > OR = 60 mL/min/1.28m 38 Low    BUN/Creatinine Ratio 6 - 22 (calc) 15   Sodium 135 - 146 mmol/L 140   Potassium 3.5 - 5.3 mmol/L 3.5   Chloride 98 - 110 mmol/L 102   Bicarbonate (CO2) 20 - 32 mmol/L 28   Calcium 8.6 - 10.3 mg/dL 9.0   Phosphorus 2.1 - 4.3 mg/dL 3.0   Albumin 3.6 - 5.1 g/dL 3.9   Resulting Agency  [facility]    ECG: Date: 02/05/2021 Time ECG obtained: 1038 AM Rate: 67 bpm Rhythm:  Sinus rhythm with first-degree AV block; LAFB Intervals: PR 210 ms. QRS 102 ms. QTc 431 ms. ST segment and T wave changes: No evidence of acute ST segment elevation or depression.  Evidence of an age undetermined septal infarct present. Comparison: Similar to previous tracing obtained on 11/19/2018   IMAGING / PROCEDURES: STRESS ECHOCARDIOGRAM performed on 05/18/2018 LVEF >55% No evidence of stress-induced ischemia Ventricular and atrial ectopy with no significant arrhythmias Normal response to blood pressure with stress No valvular stenosis  TILT TABLE TEST performed on 09/04/2017 Mild normal tilt table test with stable BP and HR course, mid  throughout study, supine, upright and trendelenberg.   Mild left carotid sinus hypersensitivity with decrease in HR from 62 --> 44 junctional rhythm with stable BP  MINIMALLY INVASIVE DIRECT CORONARY ARTERY BYPASS (MIDCAB) performed on 11/29/2013 Single-vessel MIDCAB procedure LIMA-LAD  LEFT HEART CATHETERIZATION AND CORONARY ANGIOGRAPHY performed on 11/23/2013 Single-vessel CAD 100% LAD Recommendations-referred to CVTS for consideration of CABG procedure  Impression and Plan:  Derek Andersen has been referred for pre-anesthesia review and clearance prior to him undergoing the planned anesthetic and procedural courses. Available labs, pertinent testing, and imaging results were personally reviewed by me. This patient has been appropriately cleared by cardiology with an overall MODERATE risk of significant perioperative cardiovascular complications.  Based on clinical review performed today (02/08/21), barring any significant acute changes in the patient's overall condition, it is anticipated that he will be able to proceed with the planned surgical intervention. Any acute changes in clinical condition may necessitate his procedure being postponed and/or cancelled. Patient will meet with anesthesia team (MD and/or CRNA) on the day of his procedure for preoperative evaluation/assessment. Questions regarding anesthetic course will be fielded at that time.   Pre-surgical instructions were reviewed with the patient during his PAT appointment and questions were fielded by PAT clinical staff. Patient was advised that if any questions or concerns arise prior to his procedure then he should return a call to PAT and/or his surgeon's office to discuss.  Honor Loh, MSN, APRN, FNP-C, CEN [facility]  Peri-operative Services Nurse Practitioner Phone: [phone] Fax: [phone] 02/08/21 1:49 PM  NOTE: This note has been prepared using Dragon dictation software. Despite my  best ability to proofread, there is always the potential that unintentional transcriptional errors may still occur from this process.

## 2021-02-06 ENCOUNTER — Other Ambulatory Visit: Admission: RE | Admit: 2021-02-06 | Payer: Medicare HMO | Source: Ambulatory Visit

## 2021-02-07 ENCOUNTER — Telehealth: Payer: Self-pay | Admitting: *Deleted

## 2021-02-07 ENCOUNTER — Other Ambulatory Visit: Payer: Self-pay

## 2021-02-07 ENCOUNTER — Encounter
Admission: RE | Admit: 2021-02-07 | Discharge: 2021-02-07 | Disposition: A | Discharge status: Home / Self Care | Payer: Medicare HMO | Source: Ambulatory Visit | Attending: Urology | Admitting: Urology

## 2021-02-07 ENCOUNTER — Encounter: Payer: Self-pay | Admitting: Urology

## 2021-02-07 DIAGNOSIS — Z01810 Encounter for preprocedural cardiovascular examination: Secondary | ICD-10-CM | POA: Diagnosis present

## 2021-02-07 NOTE — Telephone Encounter (Signed)
    Patient Name: Derek Andersen  DOB: 06/30/1940 MRN: [id number]  Primary Cardiologist: None  Chart reviewed as part of pre-operative protocol coverage. Patient does not follow at [facility]. He is followed by Cardiology at [facility]. Please reach out to them for pre-op evaluation.   I will route this recommendation to the requesting party via Epic fax function and remove from pre-op pool.   Darreld Mclean, PA-C 02/07/2021, 3:52 PM

## 2021-02-07 NOTE — Telephone Encounter (Signed)
-----   Message from Karen Kitchens, NP sent at 02/07/2021  2:56 PM EST ----- Regarding: Request for pre-operative cardiac clearance Request for pre-operative cardiac clearance:  1. What type of surgery is being performed?  LEFT TOTAL KNEE ARTHROPLASTY  2. When is this surgery scheduled?  02/15/2021  3. Are there any medications that need to be held prior to surgery? ASA + ticagrelor  4. Practice name and name of physician performing surgery?  Performing surgeon: Dr. Milagros Evener, MD Requesting clearance: Honor Loh, FNP-C    5. Anesthesia type (none, local, MAC, general)? General   6. What is the office phone and fax number?   Phone: [phone] Fax: [phone]  ATTENTION: Unable to create telephone message as per your standard workflow. Directed by [facility] providers to send requests for cardiac clearance to this pool for appropriate distribution to provider covering pre-operative clearances.   Honor Loh, MSN, APRN, FNP-C, CEN [facility]  Peri-operative Services Nurse Practitioner Phone: [phone] 02/07/21 2:56 PM

## 2021-02-07 NOTE — Telephone Encounter (Signed)
Request for pre-operative cardiac clearance Received: Today Derek Kitchens, NP  P Cv Div Preop Callback Request for pre-operative cardiac clearance:     1. What type of surgery is being performed?  LEFT TOTAL KNEE ARTHROPLASTY   2. When is this surgery scheduled?  02/15/2021     3. Are there any medications that need to be held prior to surgery?  ASA + ticagrelor   4. Practice name and name of physician performing surgery?  Performing surgeon: Dr. Milagros Evener, MD  Requesting clearance: Honor Loh, FNP-C       5. Anesthesia type (none, local, MAC, general)? General   6. What is the office phone and fax number?    Phone: [phone]  Fax: [phone]   ATTENTION: Unable to create telephone message as per your standard workflow. Directed by [facility] providers to send requests for cardiac clearance to this pool for appropriate distribution to provider covering pre-operative clearances.   Honor Loh, MSN, APRN, FNP-C, CEN  [facility]  Peri-operative Services Nurse Practitioner  Phone: [phone]  02/07/21 2:56 PM

## 2021-02-09 ENCOUNTER — Encounter: Payer: Self-pay | Admitting: Urology

## 2021-02-09 ENCOUNTER — Encounter
Admission: RE | Disposition: A | Discharge status: Home / Self Care | Payer: Self-pay | Source: Home / Self Care | Attending: Urology

## 2021-02-09 ENCOUNTER — Other Ambulatory Visit: Payer: Self-pay

## 2021-02-09 ENCOUNTER — Ambulatory Visit
Admission: RE | Admit: 2021-02-09 | Discharge: 2021-02-09 | Disposition: A | Discharge status: Home / Self Care | Payer: Medicare HMO | Attending: Urology | Admitting: Urology

## 2021-02-09 ENCOUNTER — Ambulatory Visit: Payer: Medicare HMO | Admitting: Urgent Care

## 2021-02-09 ENCOUNTER — Ambulatory Visit: Payer: Medicare HMO

## 2021-02-09 DIAGNOSIS — N184 Chronic kidney disease, stage 4 (severe): Secondary | ICD-10-CM | POA: Diagnosis not present

## 2021-02-09 DIAGNOSIS — Z87891 Personal history of nicotine dependence: Secondary | ICD-10-CM | POA: Diagnosis not present

## 2021-02-09 DIAGNOSIS — I272 Pulmonary hypertension, unspecified: Secondary | ICD-10-CM | POA: Insufficient documentation

## 2021-02-09 DIAGNOSIS — I129 Hypertensive chronic kidney disease with stage 1 through stage 4 chronic kidney disease, or unspecified chronic kidney disease: Secondary | ICD-10-CM | POA: Insufficient documentation

## 2021-02-09 DIAGNOSIS — E1122 Type 2 diabetes mellitus with diabetic chronic kidney disease: Secondary | ICD-10-CM | POA: Insufficient documentation

## 2021-02-09 DIAGNOSIS — N202 Calculus of kidney with calculus of ureter: Secondary | ICD-10-CM

## 2021-02-09 DIAGNOSIS — N200 Calculus of kidney: Secondary | ICD-10-CM

## 2021-02-09 DIAGNOSIS — Z86718 Personal history of other venous thrombosis and embolism: Secondary | ICD-10-CM | POA: Insufficient documentation

## 2021-02-09 DIAGNOSIS — N201 Calculus of ureter: Secondary | ICD-10-CM | POA: Diagnosis present

## 2021-02-09 DIAGNOSIS — D631 Anemia in chronic kidney disease: Secondary | ICD-10-CM | POA: Insufficient documentation

## 2021-02-09 HISTORY — DX: Ankylosing hyperostosis (forestier), site unspecified: M48.10

## 2021-02-09 HISTORY — PX: CYSTOSCOPY/URETEROSCOPY/HOLMIUM LASER/STENT PLACEMENT: SHX6546

## 2021-02-09 HISTORY — DX: Calculus of kidney: N20.0

## 2021-02-09 HISTORY — DX: Hyperlipidemia, unspecified: E78.5

## 2021-02-09 HISTORY — PX: CYSTOSCOPY W/ RETROGRADES: SHX1426

## 2021-02-09 HISTORY — DX: Unsteadiness on feet: R26.81

## 2021-02-09 HISTORY — DX: Spinal stenosis, lumbar region with neurogenic claudication: M48.062

## 2021-02-09 HISTORY — DX: Iron deficiency anemia, unspecified: D50.9

## 2021-02-09 HISTORY — DX: Atherosclerotic heart disease of native coronary artery without angina pectoris: I25.10

## 2021-02-09 HISTORY — DX: Benign prostatic hyperplasia without lower urinary tract symptoms: N40.0

## 2021-02-09 HISTORY — DX: Long term (current) use of anticoagulants: Z79.01

## 2021-02-09 HISTORY — DX: Type 2 diabetes mellitus without complications: E11.9

## 2021-02-09 HISTORY — DX: Spinal stenosis, site unspecified: M48.00

## 2021-02-09 HISTORY — DX: Obstructive sleep apnea (adult) (pediatric): G47.33

## 2021-02-09 HISTORY — DX: Male erectile dysfunction, unspecified: N52.9

## 2021-02-09 HISTORY — DX: Postlaminectomy syndrome, not elsewhere classified: M96.1

## 2021-02-09 HISTORY — DX: Pulmonary hypertension, unspecified: I27.20

## 2021-02-09 HISTORY — DX: Unspecified malignant neoplasm of skin, unspecified: C44.90

## 2021-02-09 HISTORY — DX: Chronic kidney disease, stage 4 (severe): N18.4

## 2021-02-09 LAB — GLUCOSE, CAPILLARY
Glucose-Capillary: 148 mg/dL — ABNORMAL HIGH (ref 70–99)
Glucose-Capillary: 163 mg/dL — ABNORMAL HIGH (ref 70–99)

## 2021-02-09 SURGERY — CYSTOSCOPY/URETEROSCOPY/HOLMIUM LASER/STENT PLACEMENT
Anesthesia: General | Laterality: Left

## 2021-02-09 MED ORDER — ROCURONIUM BROMIDE 100 MG/10ML IV SOLN
INTRAVENOUS | Status: DC | PRN
  Administered 2021-02-09: 10 mg via INTRAVENOUS
  Administered 2021-02-09: 20 mg via INTRAVENOUS

## 2021-02-09 MED ORDER — FENTANYL CITRATE (PF) 100 MCG/2ML IJ SOLN
INTRAMUSCULAR | Status: AC
  Filled 2021-02-09: qty 2

## 2021-02-09 MED ORDER — DEXAMETHASONE SODIUM PHOSPHATE 10 MG/ML IJ SOLN
INTRAMUSCULAR | Status: AC
  Filled 2021-02-09: qty 1

## 2021-02-09 MED ORDER — PROPOFOL 10 MG/ML IV BOLUS
INTRAVENOUS | Status: DC | PRN
  Administered 2021-02-09: 160 mg via INTRAVENOUS

## 2021-02-09 MED ORDER — ROCURONIUM BROMIDE 10 MG/ML (PF) SYRINGE
PREFILLED_SYRINGE | INTRAVENOUS | Status: AC
  Filled 2021-02-09: qty 10

## 2021-02-09 MED ORDER — SODIUM CHLORIDE 0.9 % IV SOLN: INTRAVENOUS | Status: DC

## 2021-02-09 MED ORDER — SUCCINYLCHOLINE CHLORIDE 200 MG/10ML IV SOSY
PREFILLED_SYRINGE | INTRAVENOUS | Status: AC
  Filled 2021-02-09: qty 10

## 2021-02-09 MED ORDER — ACETAMINOPHEN 10 MG/ML IV SOLN: 1000.0000 mg | Freq: Once | INTRAVENOUS | Status: DC | PRN

## 2021-02-09 MED ORDER — ONDANSETRON HCL 4 MG/2ML IJ SOLN: 4.0000 mg | Freq: Once | INTRAMUSCULAR | Status: DC | PRN

## 2021-02-09 MED ORDER — ORAL CARE MOUTH RINSE: 15.0000 mL | Freq: Once | OROMUCOSAL | Status: DC

## 2021-02-09 MED ORDER — PROPOFOL 500 MG/50ML IV EMUL
INTRAVENOUS | Status: AC
  Filled 2021-02-09: qty 50

## 2021-02-09 MED ORDER — OXYCODONE HCL 5 MG/5ML PO SOLN: 5.0000 mg | Freq: Once | ORAL | Status: DC | PRN

## 2021-02-09 MED ORDER — ONDANSETRON HCL 4 MG/2ML IJ SOLN
INTRAMUSCULAR | Status: DC | PRN
  Administered 2021-02-09: 4 mg via INTRAVENOUS

## 2021-02-09 MED ORDER — PHENYLEPHRINE HCL (PRESSORS) 10 MG/ML IV SOLN
INTRAVENOUS | Status: AC
  Filled 2021-02-09: qty 1

## 2021-02-09 MED ORDER — CHLORHEXIDINE GLUCONATE 0.12 % MT SOLN: 15.0000 mL | Freq: Once | OROMUCOSAL | Status: DC

## 2021-02-09 MED ORDER — OXYCODONE HCL 5 MG PO TABS: 5.0000 mg | ORAL_TABLET | Freq: Once | ORAL | Status: DC | PRN

## 2021-02-09 MED ORDER — LIDOCAINE HCL (CARDIAC) PF 100 MG/5ML IV SOSY
PREFILLED_SYRINGE | INTRAVENOUS | Status: DC | PRN
  Administered 2021-02-09: 100 mg via INTRAVENOUS

## 2021-02-09 MED ORDER — FENTANYL CITRATE (PF) 100 MCG/2ML IJ SOLN
INTRAMUSCULAR | Status: DC | PRN
  Administered 2021-02-09: 25 ug via INTRAVENOUS

## 2021-02-09 MED ORDER — NITROFURANTOIN MACROCRYSTAL 100 MG PO CAPS: 100.0000 mg | ORAL_CAPSULE | Freq: Every day | ORAL | 0 refills | Status: AC

## 2021-02-09 MED ORDER — SODIUM CHLORIDE 0.9 % IR SOLN
Status: DC | PRN
  Administered 2021-02-09: 3000 mL

## 2021-02-09 MED ORDER — SUCCINYLCHOLINE CHLORIDE 200 MG/10ML IV SOSY
PREFILLED_SYRINGE | INTRAVENOUS | Status: DC | PRN
  Administered 2021-02-09: 100 mg via INTRAVENOUS

## 2021-02-09 MED ORDER — FENTANYL CITRATE (PF) 100 MCG/2ML IJ SOLN: 25.0000 ug | INTRAMUSCULAR | Status: DC | PRN

## 2021-02-09 MED ORDER — SUGAMMADEX SODIUM 200 MG/2ML IV SOLN
INTRAVENOUS | Status: DC | PRN
Start: 2021-02-09 — End: 2021-02-09
  Administered 2021-02-09: 200 mg via INTRAVENOUS

## 2021-02-09 MED ORDER — LACTATED RINGERS IV SOLN: INTRAVENOUS | Status: DC

## 2021-02-09 MED ORDER — CEFAZOLIN SODIUM-DEXTROSE 2-4 GM/100ML-% IV SOLN
INTRAVENOUS | Status: AC
  Filled 2021-02-09: qty 100

## 2021-02-09 MED ORDER — CEFAZOLIN SODIUM-DEXTROSE 2-4 GM/100ML-% IV SOLN
2.0000 g | INTRAVENOUS | Status: AC
  Administered 2021-02-09: 2 g via INTRAVENOUS

## 2021-02-09 MED ORDER — ONDANSETRON HCL 4 MG/2ML IJ SOLN
INTRAMUSCULAR | Status: AC
  Filled 2021-02-09: qty 2

## 2021-02-09 MED ORDER — PHENYLEPHRINE HCL (PRESSORS) 10 MG/ML IV SOLN
INTRAVENOUS | Status: DC | PRN
  Administered 2021-02-09: 160 ug via INTRAVENOUS

## 2021-02-09 MED ORDER — DEXAMETHASONE SODIUM PHOSPHATE 10 MG/ML IJ SOLN
INTRAMUSCULAR | Status: DC | PRN
  Administered 2021-02-09: 10 mg via INTRAVENOUS

## 2021-02-09 MED ORDER — IOHEXOL 180 MG/ML  SOLN
INTRAMUSCULAR | Status: DC | PRN
  Administered 2021-02-09: 10 mL

## 2021-02-09 SURGICAL SUPPLY — 31 items
BAG DRAIN CYSTO-URO LG1000N (MISCELLANEOUS) ×3 IMPLANT
BRUSH SCRUB EZ 1% IODOPHOR (MISCELLANEOUS) ×3 IMPLANT
CATH URET FLEX-TIP 2 LUMEN 10F (CATHETERS) ×1 IMPLANT
CATH URETL OPEN 5X70 (CATHETERS) IMPLANT
CNTNR SPEC 2.5X3XGRAD LEK (MISCELLANEOUS)
CONT SPEC 4OZ STER OR WHT (MISCELLANEOUS)
CONT SPEC 4OZ STRL OR WHT (MISCELLANEOUS)
CONTAINER SPEC 2.5X3XGRAD LEK (MISCELLANEOUS) IMPLANT
DRAPE UTILITY 15X26 TOWEL STRL (DRAPES) ×3 IMPLANT
DRSG TEGADERM 2-3/8X2-3/4 SM (GAUZE/BANDAGES/DRESSINGS) ×3 IMPLANT
GAUZE 4X4 16PLY ~~LOC~~+RFID DBL (SPONGE) ×6 IMPLANT
GLOVE SURG UNDER POLY LF SZ7.5 (GLOVE) ×3 IMPLANT
GOWN STRL REUS W/ TWL LRG LVL3 (GOWN DISPOSABLE) ×2 IMPLANT
GOWN STRL REUS W/ TWL XL LVL3 (GOWN DISPOSABLE) ×2 IMPLANT
GOWN STRL REUS W/TWL LRG LVL3 (GOWN DISPOSABLE) ×3
GOWN STRL REUS W/TWL XL LVL3 (GOWN DISPOSABLE) ×3
GUIDEWIRE STR DUAL SENSOR (WIRE) ×4 IMPLANT
INFUSOR MANOMETER BAG 3000ML (MISCELLANEOUS) ×3 IMPLANT
IV NS IRRIG 3000ML ARTHROMATIC (IV SOLUTION) ×3 IMPLANT
KIT TURNOVER CYSTO (KITS) ×3 IMPLANT
PACK CYSTO AR (MISCELLANEOUS) ×3 IMPLANT
SET CYSTO W/LG BORE CLAMP LF (SET/KITS/TRAYS/PACK) ×3 IMPLANT
SHEATH URETERAL 12FRX35CM (MISCELLANEOUS) ×1 IMPLANT
STENT URET 6FRX24 CONTOUR (STENTS) IMPLANT
STENT URET 6FRX26 CONTOUR (STENTS) ×1 IMPLANT
SURGILUBE 2OZ TUBE FLIPTOP (MISCELLANEOUS) ×3 IMPLANT
SYR 10ML LL (SYRINGE) ×3 IMPLANT
TRACTIP FLEXIVA PULSE ID 200 (Laser) ×1 IMPLANT
VALVE UROSEAL ADJ ENDO (VALVE) ×1 IMPLANT
WATER STERILE IRR 1000ML POUR (IV SOLUTION) ×3 IMPLANT
WATER STERILE IRR 500ML POUR (IV SOLUTION) ×3 IMPLANT

## 2021-02-09 NOTE — Transfer of Care (Signed)
Immediate Anesthesia Transfer of Care Note  Patient: Derek Andersen  Procedure(s) Performed: CYSTOSCOPY/URETEROSCOPY/HOLMIUM LASER/STENT PLACEMENT (Left) CYSTOSCOPY WITH RETROGRADE PYELOGRAM  Patient Location: PACU  Anesthesia Type:General  Level of Consciousness: drowsy and patient cooperative  Airway & Oxygen Therapy: Patient Spontanous Breathing and Patient connected to face mask oxygen  Post-op Assessment: Report given to RN and Post -op Vital signs reviewed and stable  Post vital signs: Reviewed and stable  Last Vitals:  Vitals Value Taken Time  BP 147/66 02/09/21 0851  Temp 36.3 C 02/09/21 0851  Pulse 82 02/09/21 0858  Resp 21 02/09/21 0858  SpO2 95 % 02/09/21 0858  Vitals shown include unvalidated device data.  Last Pain:  Vitals:   02/09/21 0851  TempSrc:   PainSc: 0-No pain         Complications:  Encounter Notable Events  Notable Event Outcome Phase Comment  Difficult to intubate - expected  Intraprocedure plan to use driveable stylet and video larygoscope

## 2021-02-09 NOTE — Op Note (Signed)
Date of procedure: 02/09/21  Preoperative diagnosis:  Left proximal ureteral stone Left renal stones  Postoperative diagnosis:  Same  Procedure: Cystoscopy, left retrograde pyelogram with intraoperative interpretation, left ureteral stent placement Left ureteroscopy and laser lithotripsy of proximal ureteral stone Left ureteroscopy and laser lithotripsy of left renal stones  Surgeon: Nickolas Madrid, MD  Anesthesia: General  Complications: None  Intraoperative findings:  Small prostate, high bladder neck, mild to moderate trabeculations, no suspicious lesions Uncomplicated dusting of 1.8 cm left UPJ stone, and 8 mm lower pole stones Uncomplicated stent placement  EBL: Minimal  Specimens: Stone for analysis  Drains: Left 6 French by 26 cm ureteral stent  Indication: LAM[place] Derek Andersen is a 80 y.o. patient with large 1.8 cm left UPJ stone and smaller left lower pole renal stones and worsening renal function.  After reviewing the management options for treatment, they elected to proceed with the above surgical procedure(s). We have discussed the potential benefits and risks of the procedure, side effects of the proposed treatment, the likelihood of the patient achieving the goals of the procedure, and any potential problems that might occur during the procedure or recuperation. Informed consent has been obtained.  Description of procedure:  The patient was taken to the operating room and general anesthesia was induced. SCDs were placed for DVT prophylaxis. The patient was placed in the dorsal lithotomy position, prepped and draped in the usual sterile fashion, and preoperative antibiotics(Ancef) were administered. A preoperative time-out was performed.   A 21 French rigid cystoscope was used to intubate the urethra and a normal-appearing urethra was followed proximally to the bladder.  The prostate was small with a high bladder neck.  Thorough cystoscopy showed that the ureteral  orifices were orthotopic bilaterally, and there were mild to moderate trabeculations.  A sensor wire was used to intubate the left ureteral orifice and this was passed up to the kidney under fluoroscopic vision.  The stones could clearly be seen on fluoroscopy.  A dual-lumen ureteral access catheter was added for gentle dilation and a second safety wire was placed into the kidney.  The 12/14 French ureteral access sheath then advanced easily over the wire up to the stone under fluoroscopic vision.  The single channel digital flexible ureteroscope advanced through the sheath and identified a large 1.8 cm stone in the proximal ureter.  This was dusted with a 240 m laser fiber on settings of 0.5 J and 80 Hz.  The flexible ureteroscope was then advanced into the lower pole of the kidney, and the 2 lower pole stones were dusted on previously mentioned settings.  Thorough pyeloscopy revealed no residual stone fragments >30mm.  A retrograde pyelogram was performed from the proximal ureter and showed no extravasation or filling defects.  No stone could be seen on fluoroscopy at the conclusion of the case.  Careful pullback ureteroscopy showed no ureteral injury or residual stones.  The rigid cystoscope was backloaded over the wire and a 6 [place] by 26 cm ureteral stent was uneventfully placed with a curl in the upper pole, as well as in the bladder under direct vision.  With the bladder decompressed, urine was seen to drain through the side ports of the stent.    Disposition: Stable to PACU  Plan: Stent removal in clinic in 10 to 14 days, nitrofurantoin prophylaxis  Nickolas Madrid, MD

## 2021-02-09 NOTE — Discharge Instructions (Addendum)
AMBULATORY SURGERY  DISCHARGE INSTRUCTIONS   The drugs that you were given will stay in your system until tomorrow so for the next 24 hours you should not:  Drive an automobile Make any legal decisions Drink any alcoholic beverage   You may resume regular meals tomorrow.  Today it is better to start with liquids and gradually work up to solid foods.  You may eat anything you prefer, but it is better to start with liquids, then soup and crackers, and gradually work up to solid foods.   Please notify your doctor immediately if you have any unusual bleeding, trouble breathing, redness and pain at the surgery site, drainage, fever, or pain not relieved by medication.     Your post-operative visit with Dr.                                       is: Date:                        Time:    Please call to schedule your post-operative visit.  Additional Instructions:   [place] [address]; [place]

## 2021-02-09 NOTE — Anesthesia Postprocedure Evaluation (Signed)
Anesthesia Post Note  Patient: Derek Andersen  Procedure(s) Performed: CYSTOSCOPY/URETEROSCOPY/HOLMIUM LASER/STENT PLACEMENT (Left) CYSTOSCOPY WITH RETROGRADE PYELOGRAM  Patient location during evaluation: PACU Anesthesia Type: General Level of consciousness: awake and alert, oriented and patient cooperative Pain management: pain level controlled Vital Signs Assessment: post-procedure vital signs reviewed and stable Respiratory status: spontaneous breathing, nonlabored ventilation and respiratory function stable Cardiovascular status: blood pressure returned to baseline and stable Postop Assessment: adequate PO intake Anesthetic complications: yes   Encounter Notable Events  Notable Event Outcome Phase Comment  Difficult to intubate - expected  Intraprocedure plan to use driveable stylet and video larygoscope     Last Vitals:  Vitals:   02/09/21 0931 02/09/21 0948  BP: (!) 144/71 (!) 149/72  Pulse: 81 81  Resp: (!) 23 18  Temp: (!) 36.3 C (!) 35.9 C  SpO2: 92% 92%    Last Pain:  Vitals:   02/09/21 0948  TempSrc: Temporal  PainSc: 0-No pain                 Darrin Nipper

## 2021-02-09 NOTE — Anesthesia Preprocedure Evaluation (Signed)
Anesthesia Evaluation  Patient identified by MRN, date of birth, ID band Patient awake    Reviewed: Allergy & Precautions, NPO status , Patient's Chart, lab work & pertinent test results  History of Anesthesia Complications (+) history of anesthetic complications (hx LOC with back pain/discomfort; see notes below)  Airway Mallampati: IV   Neck ROM: Limited    Dental   Missing molars x2:   Pulmonary sleep apnea and Continuous Positive Airway Pressure Ventilation , former smoker (quit 1968; chewing tobacco until 1970s),    Pulmonary exam normal breath sounds clear to auscultation       Cardiovascular hypertension, + CAD (s/p MI, CABG, and stents on Eliquis, last dose 02/08/21)  Normal cardiovascular exam+ dysrhythmias (hx postop a fib after CABG, currently sinus rhythm) + Valvular Problems/Murmurs (pulmonary HTN)  Rhythm:Regular Rate:Normal  Hx DVT 1985  STRESS ECHOCARDIOGRAM 05/18/18: 1. LVEF >55% 2. No evidence of stress-induced ischemia 3. Ventricular and atrial ectopy with no significant arrhythmias 4. Normal response to blood pressure with stress 5. No valvular stenosis   Neuro/Psych Recent hx SISpinal stenosis s/p fusion  Neuromuscular disease (neuropathy)    GI/Hepatic GERD  ,  Endo/Other  diabetes, Type 2Obesity   Renal/GU Renal disease (nephrolithiasis, stage IV CKD)     Musculoskeletal   Abdominal   Peds  Hematology  (+) Blood dyscrasia, anemia ,   Anesthesia Other Findings Pt reports two episodes of LOC while moving to OR table for procedures at [facility].  He explains, "They move me to the table and I pass out and urinate myself.  They did tilt-table testing and everything was normal.  I also passed out when I hit my back against the car door once."  Reviewed and agree with Bayard Males pre-anesthesia clinical review note.  Reproductive/Obstetrics                             Anesthesia  Physical Anesthesia Plan  ASA: 3  Anesthesia Plan: General   Post-op Pain Management:    Induction: Intravenous  PONV Risk Score and Plan: 2 and Ondansetron, Dexamethasone and Treatment may vary due to age or medical condition  Airway Management Planned: Oral ETT  Additional Equipment:   Intra-op Plan:   Post-operative Plan: Extubation in OR  Informed Consent: I have reviewed the patients History and Physical, chart, labs and discussed the procedure including the risks, benefits and alternatives for the proposed anesthesia with the patient or authorized representative who has indicated his/her understanding and acceptance.     Dental advisory given  Plan Discussed with: CRNA  Anesthesia Plan Comments: (Patient consented for risks of anesthesia including but not limited to:  - adverse reactions to medications - damage to eyes, teeth, lips or other oral mucosa - nerve damage due to positioning  - sore throat or hoarseness - damage to heart, brain, nerves, lungs, other parts of body or loss of life  Informed patient about role of CRNA in peri- and intra-operative care.  Patient voiced understanding.)        Anesthesia Quick Evaluation

## 2021-02-09 NOTE — H&P (Signed)
02/09/21 6:56 AM   Derek Andersen Nov 17, 1940 [id number]  CC: Left UPJ stone  HPI: Comorbid 80 year old male with large left UPJ stone for at least 1 year and worsening renal function.  He opted for left ureteroscopy and laser lithotripsy for definitive management.   PMH: Past Medical History:  Diagnosis Date   Arthritis    Atrial fibrillation ([place])    a.) CHA2DS2-VASc = 7 (age x 2, HTN, DVT x 2, prior MI, T2DM). b.) s/p TEE with cardioversion (200J x 2) 12/06/2013. c.) chronically anticoagulated using apixaban   BPH (benign prostatic hyperplasia)    CAD (coronary artery disease)    a.) MI 12/31/83 -> LHC: 95% pLAD-1, 100% pLAD-2, 50% mLAD. b.) LHC 05/08/88: 50 pRCA, 95% mLAD. c.) LHC 02/17/02: 25% RCA, 25% LM, 75% OM1; PCI -> 0.25 x 72mm Medtronic stent to OM1. d.) LHC 08/17/02: <25% OM1, 25% RI, 25% LAD. e.) LHC 09/02/03: 100% pLAD, 75% mLAD-1, 50% mLAD-2, <25% pRCA, <25% LM, 25% pLCx, <25% OM2, 25% RI. f.) LHC 11/23/13: 100% LAD; ref to CVTS. 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 b.) LHC: 95% pLAD-1, 100% pLAD-2, 50% mLAD; no intervention.   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 b.) gun in the home   T2DM (type 2 diabetes mellitus) ([place])     Surgical History: Past Surgical History:  Procedure Laterality Date   ANKLE SURGERY Right 1989   plate in ankle   BACK SURGERY     11x Degernerative disk   CARDIAC CATHETERIZATION Left 08/17/2002   Procedure: CARDIAC CATHETERIZATION; Location: [facility]; Surgeon: Hyman Bible, MD   CARDIAC CATHETERIZATION Left 11/23/2013   Procedure: CARDIAC CATHETERIZATION; Location: [facility]; Surgeon: Ronnald Ramp, MD   CARDIAC CATHETERIZATION Left 12/31/1983   Procedure: CARDIAC CATHETERIZATION; Location: [facility]; Surgeon: Doristine Bosworth, MD   CARDIAC CATHETERIZATION Left 05/08/1988   Procedure: CARDIAC CATHETERIZATION; Location: [facility]; Surgeon: Marthann Schiller, MD   CARDIAC CATHETERIZATION Left 09/02/2003   Procedure: CARDIAC CATHETERIZATION; Location: [facility]; Surgeon: Pura Spice, MD   CORONARY ANGIOPLASTY WITH STENT PLACEMENT Left 02/17/2002   Procedure: CORONARY ANGIOPLASTY WITH STENT PLACEMENT (0.25 x 12 mm Medtronic stent to OM1); Location: [facility]   MINIMALLY INVASIVE DIRECT CORONARY ARTERY BYPASS (MIDCAB; LIMA -LAD) Left 11/29/2013   Procedure: MINIMALLY INVASIVE DIRECT CORONARY ARTERY BYPASS (MIDCAB; LIMA -LAD); Location: [facility]     Family History: History reviewed. No pertinent family history.  Social History:  reports that he has never smoked. He has never used smokeless tobacco. He reports that he does  not currently use alcohol. He reports that he does not currently use drugs.  Physical Exam: BP (!) 157/68   Pulse 89   Temp 98.9 F (37.2 C) (Temporal)   Resp 16   Ht $R'5\' 6"'fw$  (1.676 m)   Wt 106.6 kg   SpO2 99%   BMI 37.93 kg/m    Constitutional:  Alert and oriented, No acute distress. Cardiovascular: Regular rate and rhythm Respiratory: Clear to auscultation bilaterally. GI: Abdomen is soft, nontender, nondistended, no abdominal masses  Laboratory Data: Urine culture no  growth  Pertinent Imaging: I have personally viewed and interpreted the CT showing a large left UPJ stone, and 2 smaller lower pole stones.  Assessment & Plan:   80 year old male with large left UPJ stone and 2 smaller lower pole stones, worsening renal function.  We specifically discussed the risks ureteroscopy including bleeding, infection/sepsis, stent related symptoms including flank pain/urgency/frequency/incontinence/dysuria, ureteral injury, inability to access stone, or need for staged or additional procedures.  Left ureteroscopy, laser lithotripsy, stent placement  Nickolas Madrid, MD 02/09/2021  [facility] [address], [place], [place] [address] [phone]

## 2021-02-09 NOTE — Anesthesia Procedure Notes (Signed)
Procedure Name: Intubation Date/Time: 02/09/2021 7:44 AM Performed by: Aline Brochure, CRNA Pre-anesthesia Checklist: Patient identified, Patient being monitored, Timeout performed, Emergency Drugs available and Suction available Patient Re-evaluated:Patient Re-evaluated prior to induction Oxygen Delivery Method: Circle system utilized Preoxygenation: Pre-oxygenation with 100% oxygen Induction Type: IV induction Ventilation: Mask ventilation without difficulty Laryngoscope Size: McGraph and 4 Grade View: Grade I Tube type: Oral Tube size: 7.5 mm Number of attempts: 1 Airway Equipment and Method: Stylet, Bougie stylet and Video-laryngoscopy (driveable stylet) Placement Confirmation: ETT inserted through vocal cords under direct vision, positive ETCO2 and breath sounds checked- equal and bilateral Secured at: 21 cm Tube secured with: Tape Dental Injury: Teeth and Oropharynx as per pre-operative assessment  Difficulty Due To: Difficulty was anticipated Future Recommendations: Recommend- induction with short-acting agent, and alternative techniques readily available

## 2021-02-09 NOTE — OR Nursing (Signed)
Dr. Diamantina Providence notified yesterday that patient called and reports he did not stop his eliquis. Last dose was taken yesterday morning. Dr. Diamantina Providence reports it is still okay to proceed with surgery.

## 2021-02-15 LAB — CALCULI, WITH PHOTOGRAPH (CLINICAL LAB)
Calcium Oxalate Monohydrate: 100 %
Weight Calculi: 2 mg

## 2021-02-22 ENCOUNTER — Encounter: Payer: Self-pay | Admitting: Urology

## 2021-02-22 ENCOUNTER — Ambulatory Visit (INDEPENDENT_AMBULATORY_CARE_PROVIDER_SITE_OTHER): Payer: Medicare HMO | Admitting: Urology

## 2021-02-22 ENCOUNTER — Other Ambulatory Visit: Payer: Self-pay

## 2021-02-22 VITALS — BP 115/69 | HR 76

## 2021-02-22 DIAGNOSIS — Z466 Encounter for fitting and adjustment of urinary device: Secondary | ICD-10-CM | POA: Diagnosis not present

## 2021-02-22 DIAGNOSIS — N200 Calculus of kidney: Secondary | ICD-10-CM

## 2021-02-22 LAB — URINALYSIS, COMPLETE
Bilirubin, UA: NEGATIVE
Glucose, UA: NEGATIVE
Ketones, UA: NEGATIVE
Nitrite, UA: NEGATIVE
Specific Gravity, UA: 1.015 (ref 1.005–1.030)
Urobilinogen, Ur: 0.2 mg/dL (ref 0.2–1.0)
pH, UA: 5.5 (ref 5.0–7.5)

## 2021-02-22 LAB — MICROSCOPIC EXAMINATION

## 2021-02-22 MED ORDER — LIDOCAINE HCL URETHRAL/MUCOSAL 2 % EX GEL
1.0000 | Freq: Once | CUTANEOUS | Status: AC
Start: 2021-02-22 — End: 2021-02-22
  Administered 2021-02-22: 1 via URETHRAL

## 2021-02-22 NOTE — Patient Instructions (Signed)
Dietary Guidelines to Help Prevent Kidney Stones Kidney stones are deposits of minerals and salts that form inside your kidneys. Your risk of developing kidney stones may be greater depending on your diet, your lifestyle, the medicines you take, and whether you have certain medical conditions. Most people can lower their chances of developing kidney stones by following the instructions below. Your dietitian may give you more specific instructions depending on your overall health and the type of kidney stones you tend to develop. What are tips for following this plan? Reading food labels  Choose foods with "no salt added" or "low-salt" labels. Limit your salt (sodium) intake to less than 1,500 mg a day. Choose foods with calcium for each meal and snack. Try to eat about 300 mg of calcium at each meal. Foods that contain 200-500 mg of calcium a serving include: 8 oz (237 mL) of milk, calcium-fortifiednon-dairy milk, and calcium-fortifiedfruit juice. Calcium-fortified means that calcium has been added to these drinks. 8 oz (237 mL) of kefir, yogurt, and soy yogurt. 4 oz (114 g) of tofu. 1 oz (28 g) of cheese. 1 cup (150 g) of dried figs. 1 cup (91 g) of cooked broccoli. One 3 oz (85 g) can of sardines or mackerel. Most people need 1,000-1,500 mg of calcium a day. Talk to your dietitian about how much calcium is recommended for you. Shopping Buy plenty of fresh fruits and vegetables. Most people do not need to avoid fruits and vegetables, even if these foods contain nutrients that may contribute to kidney stones. When shopping for convenience foods, choose: Whole pieces of fruit. Pre-made salads with dressing on the side. Low-fat fruit and yogurt smoothies. Avoid buying frozen meals or prepared deli foods. These can be high in sodium. Look for foods with live cultures, such as yogurt and kefir. Choose high-fiber grains, such as whole-wheat breads, oat bran, and wheat cereals. Cooking Do not add  salt to food when cooking. Place a salt shaker on the table and allow each person to add his or her own salt to taste. Use vegetable protein, such as beans, textured vegetable protein (TVP), or tofu, instead of meat in pasta, casseroles, and soups. Meal planning Eat less salt, if told by your dietitian. To do this: Avoid eating processed or pre-made food. Avoid eating fast food. Eat less animal protein, including cheese, meat, poultry, or fish, if told by your dietitian. To do this: Limit the number of times you have meat, poultry, fish, or cheese each week. Eat a diet free of meat at least 2 days a week. Eat only one serving each day of meat, poultry, fish, or seafood. When you prepare animal protein, cut pieces into small portion sizes. For most meat and fish, one serving is about the size of the palm of your hand. Eat at least five servings of fresh fruits and vegetables each day. To do this: Keep fruits and vegetables on hand for snacks. Eat one piece of fruit or a handful of berries with breakfast. Have a salad and fruit at lunch. Have two kinds of vegetables at dinner. Limit foods that are high in a substance called oxalate. These include: Spinach (cooked), rhubarb, beets, sweet potatoes, and Swiss chard. Peanuts. Potato chips, french fries, and baked potatoes with skin on. Nuts and nut products. Chocolate. If you regularly take a diuretic medicine, make sure to eat at least 1 or 2 servings of fruits or vegetables that are high in potassium each day. These include: Avocado. Banana. Orange, prune,   carrot, or tomato juice. Baked potato. Cabbage. Beans and split peas. Lifestyle  Drink enough fluid to keep your urine pale yellow. This is the most important thing you can do. Spread your fluid intake throughout the day. If you drink alcohol: Limit how much you use to: 0-1 drink a day for women who are not pregnant. 0-2 drinks a day for men. Be aware of how much alcohol is in your  drink. In the [place] one drink equals one 12 oz bottle of beer (355 mL), one 5 oz glass of wine (148 mL), or one 1 oz glass of hard liquor (44 mL). Lose weight if told by your health care provider. Work with your dietitian to find an eating plan and weight loss strategies that work best for you. General information Talk to your health care provider and dietitian about taking daily supplements. You may be told the following depending on your health and the cause of your kidney stones: Not to take supplements with vitamin C. To take a calcium supplement. To take a daily probiotic supplement. To take other supplements such as magnesium, fish oil, or vitamin B6. Take over-the-counter and prescription medicines only as told by your health care provider. These include supplements. What foods should I limit? Limit your intake of the following foods, or eat them as told by your dietitian. Vegetables Spinach. Rhubarb. Beets. Canned vegetables. Pickles. Olives. Baked potatoes with skin. Grains Wheat bran. Baked goods. Salted crackers. Cereals high in sugar. Meats and other proteins Nuts. Nut butters. Large portions of meat, poultry, or fish. Salted, precooked, or cured meats, such as sausages, meat loaves, and hot dogs. Dairy Cheese. Beverages Regular soft drinks. Regular vegetable juice. Seasonings and condiments Seasoning blends with salt. Salad dressings. Soy sauce. Ketchup. Barbecue sauce. Other foods Canned soups. Canned pasta sauce. Casseroles. Pizza. Lasagna. Frozen meals. Potato chips. French fries. The items listed above may not be a complete list of foods and beverages you should limit. Contact a dietitian for more information. What foods should I avoid? Talk to your dietitian about specific foods you should avoid based on the type of kidney stones you have and your overall health. Fruits Grapefruit. The item listed above may not be a complete list of foods and beverages you should  avoid. Contact a dietitian for more information. Summary Kidney stones are deposits of minerals and salts that form inside your kidneys. You can lower your risk of kidney stones by making changes to your diet. The most important thing you can do is drink enough fluid. Drink enough fluid to keep your urine pale yellow. Talk to your dietitian about how much calcium you should have each day, and eat less salt and animal protein as told by your dietitian. This information is not intended to replace advice given to you by your health care provider. Make sure you discuss any questions you have with your health care provider. Document Revised: 02/18/2019 Document Reviewed: 02/18/2019 Elsevier Patient Education  2022 Elsevier Inc.  

## 2021-02-22 NOTE — Progress Notes (Signed)
Cystoscopy Procedure Note:  Indication: Stent removal s/p 02/09/2021 left ureteroscopy for 1.8 cm UPJ stone  After informed consent and discussion of the procedure and its risks, Derek Andersen was positioned and prepped in the standard fashion. Cystoscopy was performed with a flexible cystoscope. The stent was grasped with flexible graspers and removed in its entirety. The patient tolerated the procedure well.  Findings: Uncomplicated stent removal  Assessment and Plan: RTC 6 months with KUB prior   Billey Co, MD 02/22/2021

## 2021-02-24 ENCOUNTER — Telehealth: Payer: Self-pay | Admitting: Urology

## 2021-02-24 ENCOUNTER — Telehealth: Payer: Medicare HMO | Admitting: Nurse Practitioner

## 2021-02-24 DIAGNOSIS — R300 Dysuria: Secondary | ICD-10-CM

## 2021-02-24 MED ORDER — SULFAMETHOXAZOLE-TRIMETHOPRIM 800-160 MG PO TABS: 1.0000 | ORAL_TABLET | Freq: Two times a day (BID) | ORAL | 0 refills | Status: DC

## 2021-02-24 NOTE — Telephone Encounter (Signed)
Received a call from nursing triage line today, Saturday 02/24/21.  Apparently this patient had a Evisit with a provider yesterday for urinary tract symptoms rather than coming to our office.  He felt that he should have been called in antibiotics.  The patient was advised to seek further attention at an urgent care which he did not do.  He called the triage line today requesting antibiotics for dysuria.  He did have a stent removed a few days ago by Dr. Diamantina Providence.  Will send Bactrim ds bid x 7 days to the pharmacy given that his weekend and will be unable to see or evaluate him.  In the future, please advise him to call our office if he is having urinary tract issues.  I will have my staff reach out to him on Monday to follow-up.  Hollice Espy, MD

## 2021-02-24 NOTE — Progress Notes (Signed)
E-Visit for Urinary Problems  Based on what you shared with me, I feel your condition warrants further evaluation and I recommend that you be seen for a face to face office visit.  Male bladder infections are not very common.  We worry about prostate or kidney conditions.  The standard of care is to examine the abdomen and kidneys, and to do a urine and blood test to make sure that something more serious is not going on.  We recommend that you see a provider today.  If your doctor's office is closed [facility] has the following Urgent Cares:    NOTE: You will not be charged for this e-visit.  If you are having a true medical emergency please call 911.       For an urgent face to face visit, [facility] has six urgent care centers for your convenience:     [facility] [place] [facility] [place] Get Driving Directions [phone] [place], [place] [address]    [facility]) Get Driving Directions [phone] [place], [place] [address]  [facility] [place] ([place]) Get Driving Directions [phone] [address] [place],  [place]  [address]  [facility] Get Driving Directions [phone] [place], [place], [place] [address]   [facility] Get Driving Directions  [phone] [address], [place] [address]   [facility] [place] Get Driving Directions [phone] [address] [place], [place] [address]  Your MyChart E-visit questionnaire answers were reviewed by a board certified advanced clinical practitioner to complete your personal care plan based on your specific symptoms.  Thank you for using e-Visits.  I spent approximately 5 minutes reviewing the patient's history, current symptoms and coordinating their plan of  care today.

## 2021-02-26 NOTE — Telephone Encounter (Signed)
Pt is feeling better. Pt verbalized understanding.

## 2021-03-07 ENCOUNTER — Encounter: Payer: Self-pay | Admitting: *Deleted

## 2021-08-23 ENCOUNTER — Encounter: Payer: Self-pay | Admitting: Urology

## 2021-08-23 ENCOUNTER — Ambulatory Visit
Admission: RE | Admit: 2021-08-23 | Discharge: 2021-08-23 | Disposition: A | Discharge status: Home / Self Care | Payer: Medicare HMO | Attending: Urology | Admitting: Urology

## 2021-08-23 ENCOUNTER — Ambulatory Visit
Admission: RE | Admit: 2021-08-23 | Discharge: 2021-08-23 | Disposition: A | Discharge status: Home / Self Care | Payer: Medicare HMO | Source: Ambulatory Visit | Attending: Urology | Admitting: Urology

## 2021-08-23 ENCOUNTER — Ambulatory Visit: Payer: Medicare HMO | Admitting: Urology

## 2021-08-23 VITALS — BP 161/72 | HR 66 | Ht 66.0 in

## 2021-08-23 DIAGNOSIS — Z466 Encounter for fitting and adjustment of urinary device: Secondary | ICD-10-CM | POA: Insufficient documentation

## 2021-08-23 DIAGNOSIS — Z87442 Personal history of urinary calculi: Secondary | ICD-10-CM

## 2021-08-23 DIAGNOSIS — N200 Calculus of kidney: Secondary | ICD-10-CM | POA: Diagnosis present

## 2021-08-23 NOTE — Progress Notes (Signed)
   08/23/2021 2:48 PM   [name] [birth year] [id number]  Reason for visit: Follow up nephrolithiasis  HPI: Comorbid 81 year old male with obesity, diabetes, CKD with baseline creatinine 1.8, EGFR 38 who presented with a large left 1 cm left UPJ stone that had been in place for over a year.  He underwent a left ureteroscopy, laser lithotripsy, and stent in December 2022 with subsequent stent removal, and has done well since that time.  He denies any gross hematuria or flank pain.  Left-sided chronic flank pain has improved since stone removal, and renal function has improved somewhat with recent creatinine of 1.6, EGFR 43.  I personally viewed and interpreted his KUB today that shows no definite evidence of residual stones, possible small left lower pole calcifications.  We discussed general stone prevention strategies including adequate hydration with goal of producing 2.5 L of urine daily, increasing citric acid intake, increasing calcium intake during high oxalate meals, minimizing animal protein, and decreasing salt intake. Information about dietary recommendations given today.   He prefers to follow up as needed, return precautions discussed  Billey Co, [place] [address], [place],  [address] [phone]

## 2021-08-23 NOTE — Patient Instructions (Signed)
Dietary Guidelines to Help Prevent Kidney Stones Kidney stones are deposits of minerals and salts that form inside your kidneys. Your risk of developing kidney stones may be greater depending on your diet, your lifestyle, the medicines you take, and whether you have certain medical conditions. Most people can lower their chances of developing kidney stones by following the instructions below. Your dietitian may give you more specific instructions depending on your overall health and the type of kidney stones you tend to develop. What are tips for following this plan? Reading food labels  Choose foods with "no salt added" or "low-salt" labels. Limit your salt (sodium) intake to less than 1,500 mg a day. Choose foods with calcium for each meal and snack. Try to eat about 300 mg of calcium at each meal. Foods that contain 200-500 mg of calcium a serving include: 8 oz (237 mL) of milk, calcium-fortifiednon-dairy milk, and calcium-fortifiedfruit juice. Calcium-fortified means that calcium has been added to these drinks. 8 oz (237 mL) of kefir, yogurt, and soy yogurt. 4 oz (114 g) of tofu. 1 oz (28 g) of cheese. 1 cup (150 g) of dried figs. 1 cup (91 g) of cooked broccoli. One 3 oz (85 g) can of sardines or mackerel. Most people need 1,000-1,500 mg of calcium a day. Talk to your dietitian about how much calcium is recommended for you. Shopping Buy plenty of fresh fruits and vegetables. Most people do not need to avoid fruits and vegetables, even if these foods contain nutrients that may contribute to kidney stones. When shopping for convenience foods, choose: Whole pieces of fruit. Pre-made salads with dressing on the side. Low-fat fruit and yogurt smoothies. Avoid buying frozen meals or prepared deli foods. These can be high in sodium. Look for foods with live cultures, such as yogurt and kefir. Choose high-fiber grains, such as whole-wheat breads, oat bran, and wheat cereals. Cooking Do not add  salt to food when cooking. Place a salt shaker on the table and allow each person to add his or her own salt to taste. Use vegetable protein, such as beans, textured vegetable protein (TVP), or tofu, instead of meat in pasta, casseroles, and soups. Meal planning Eat less salt, if told by your dietitian. To do this: Avoid eating processed or pre-made food. Avoid eating fast food. Eat less animal protein, including cheese, meat, poultry, or fish, if told by your dietitian. To do this: Limit the number of times you have meat, poultry, fish, or cheese each week. Eat a diet free of meat at least 2 days a week. Eat only one serving each day of meat, poultry, fish, or seafood. When you prepare animal protein, cut pieces into small portion sizes. For most meat and fish, one serving is about the size of the palm of your hand. Eat at least five servings of fresh fruits and vegetables each day. To do this: Keep fruits and vegetables on hand for snacks. Eat one piece of fruit or a handful of berries with breakfast. Have a salad and fruit at lunch. Have two kinds of vegetables at dinner. Limit foods that are high in a substance called oxalate. These include: Spinach (cooked), rhubarb, beets, sweet potatoes, and Swiss chard. Peanuts. Potato chips, french fries, and baked potatoes with skin on. Nuts and nut products. Chocolate. If you regularly take a diuretic medicine, make sure to eat at least 1 or 2 servings of fruits or vegetables that are high in potassium each day. These include: Avocado. Banana. Orange, prune,   carrot, or tomato juice. Baked potato. Cabbage. Beans and split peas. Lifestyle  Drink enough fluid to keep your urine pale yellow. This is the most important thing you can do. Spread your fluid intake throughout the day. If you drink alcohol: Limit how much you use to: 0-1 drink a day for women who are not pregnant. 0-2 drinks a day for men. Be aware of how much alcohol is in your  drink. In the [place] one drink equals one 12 oz bottle of beer (355 mL), one 5 oz glass of wine (148 mL), or one 1 oz glass of hard liquor (44 mL). Lose weight if told by your health care provider. Work with your dietitian to find an eating plan and weight loss strategies that work best for you. General information Talk to your health care provider and dietitian about taking daily supplements. You may be told the following depending on your health and the cause of your kidney stones: Not to take supplements with vitamin C. To take a calcium supplement. To take a daily probiotic supplement. To take other supplements such as magnesium, fish oil, or vitamin B6. Take over-the-counter and prescription medicines only as told by your health care provider. These include supplements. What foods should I limit? Limit your intake of the following foods, or eat them as told by your dietitian. Vegetables Spinach. Rhubarb. Beets. Canned vegetables. Pickles. Olives. Baked potatoes with skin. Grains Wheat bran. Baked goods. Salted crackers. Cereals high in sugar. Meats and other proteins Nuts. Nut butters. Large portions of meat, poultry, or fish. Salted, precooked, or cured meats, such as sausages, meat loaves, and hot dogs. Dairy Cheese. Beverages Regular soft drinks. Regular vegetable juice. Seasonings and condiments Seasoning blends with salt. Salad dressings. Soy sauce. Ketchup. Barbecue sauce. Other foods Canned soups. Canned pasta sauce. Casseroles. Pizza. Lasagna. Frozen meals. Potato chips. French fries. The items listed above may not be a complete list of foods and beverages you should limit. Contact a dietitian for more information. What foods should I avoid? Talk to your dietitian about specific foods you should avoid based on the type of kidney stones you have and your overall health. Fruits Grapefruit. The item listed above may not be a complete list of foods and beverages you should  avoid. Contact a dietitian for more information. Summary Kidney stones are deposits of minerals and salts that form inside your kidneys. You can lower your risk of kidney stones by making changes to your diet. The most important thing you can do is drink enough fluid. Drink enough fluid to keep your urine pale yellow. Talk to your dietitian about how much calcium you should have each day, and eat less salt and animal protein as told by your dietitian. This information is not intended to replace advice given to you by your health care provider. Make sure you discuss any questions you have with your health care provider. Document Revised: 11/06/2020 Document Reviewed: 11/06/2020 Elsevier Patient Education  2023 [employer]  

## 2022-06-23 IMAGING — CT CT RENAL STONE PROTOCOL
1 of 2 series · 12 of 32 positions shown, 16 images · non-contrast
Comparison: 07/01/2019
COMPARISON: 07/01/2019

Addendum:
CLINICAL DATA: Abdominal pain, history of kidney stones

EXAM:
CT ABDOMEN AND PELVIS WITHOUT CONTRAST
TECHNIQUE: Multidetector CT imaging of the abdomen and pelvis was performed
following the standard protocol without IV contrast.

[Series 3: axial st · axial · 0.98mm/px · z∈[-965,-535]mm · 12 of 100 slices shown, 16 images]
[im 9/100  soft-tissue]
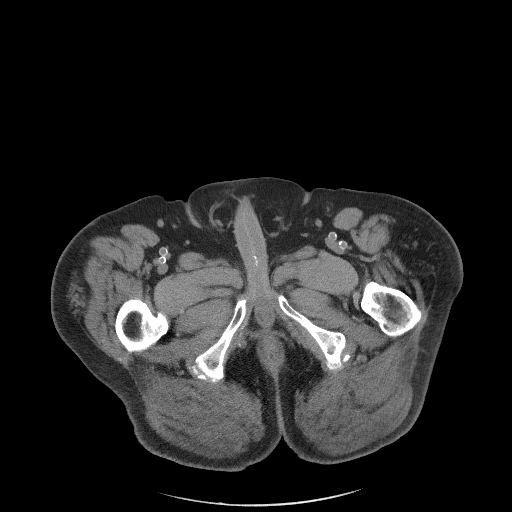
[im 9/100  bone]
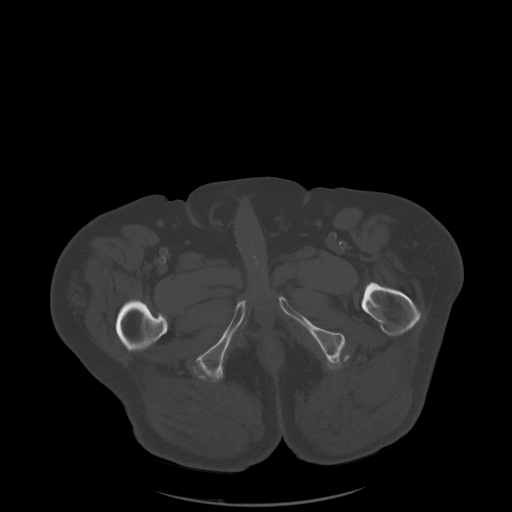
[im 17/100  soft-tissue]
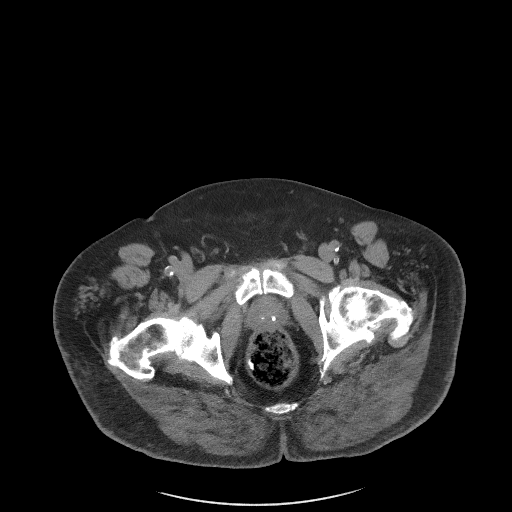
[im 25/100  soft-tissue]
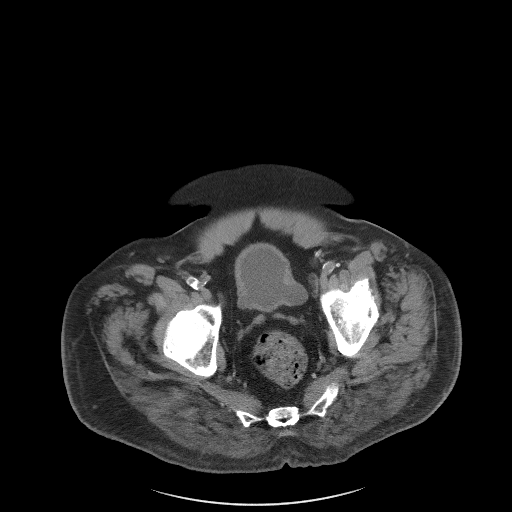
[im 38/100  soft-tissue]
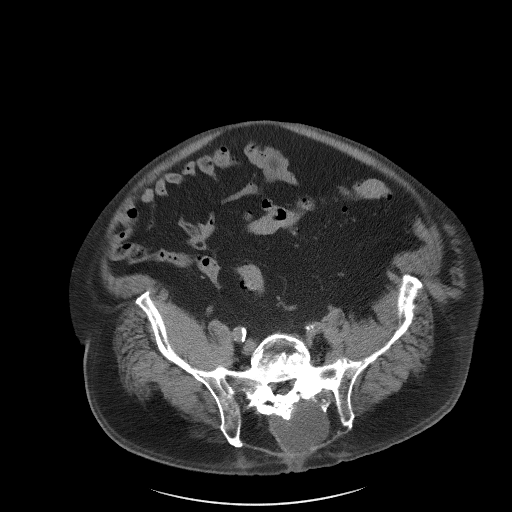
[im 46/100  soft-tissue]
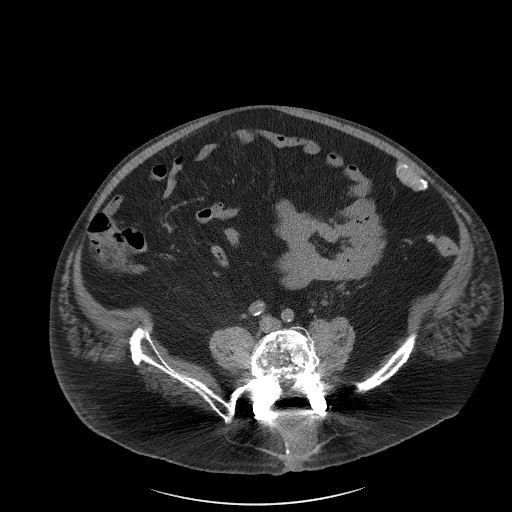
[im 54/100  soft-tissue]
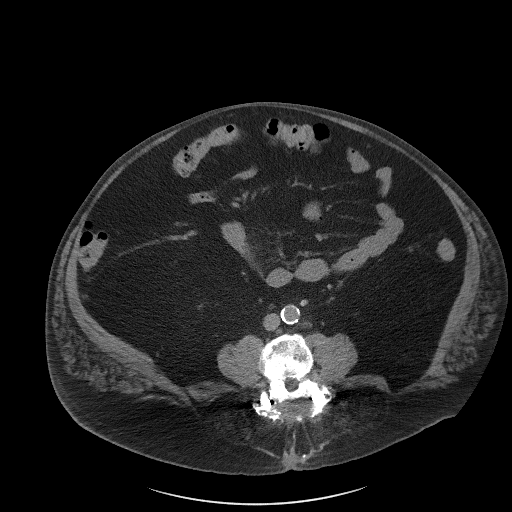
[im 62/100  soft-tissue]
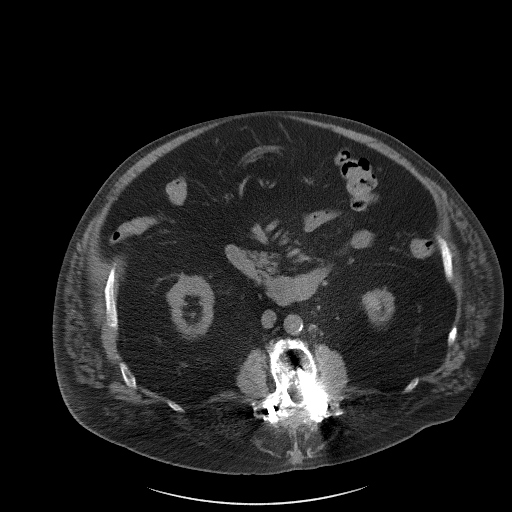
[im 75/100  soft-tissue]
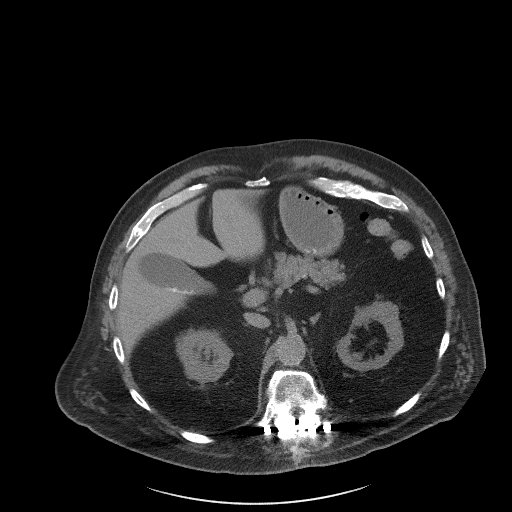
[im 83/100  soft-tissue]
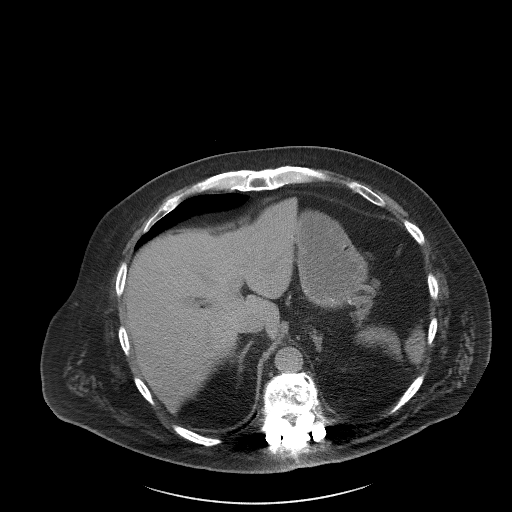
[im 83/100  lung]
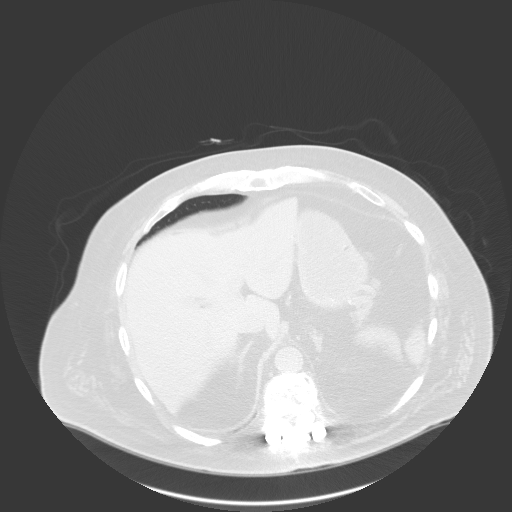
[im 83/100  bone]
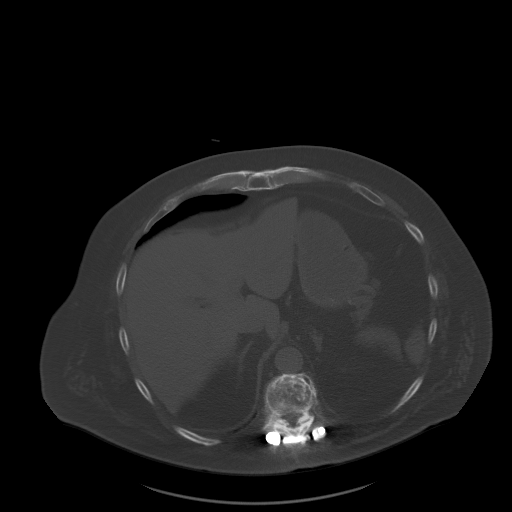
[im 87/100  lung]
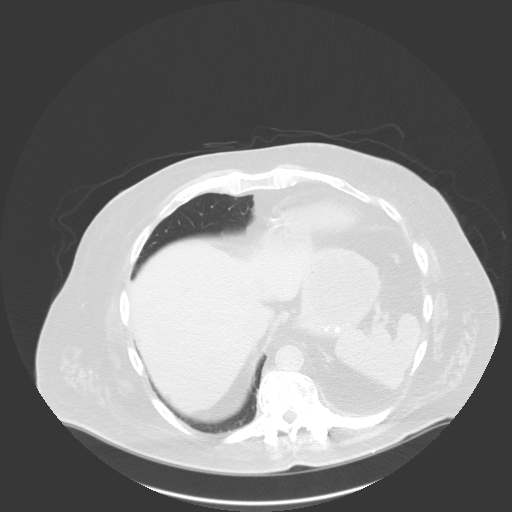
[im 91/100  soft-tissue]
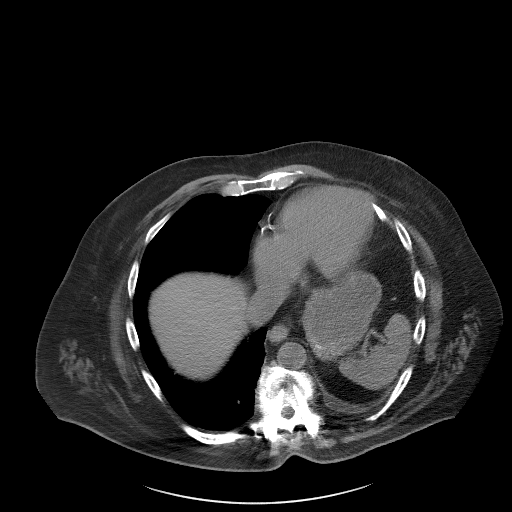
[im 91/100  lung]
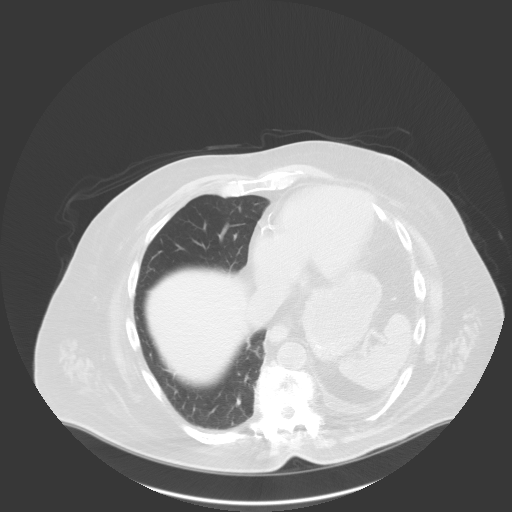
[im 95/100  lung]
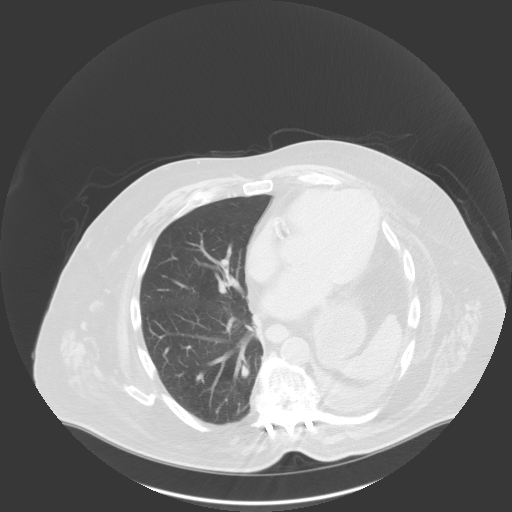

[12 of 32 positions shown; findings below may reference images not displayed]

FINDINGS: Lower chest: Dense consolidation or atelectasis of the left lung
base with associated with a small, chronic, loculated pleural
effusion, unchanged compared to prior examination. Coronary artery
calcifications.

Hepatobiliary: No solid liver abnormality is seen. Multiple
gallstones in the dependent gallbladder. Gallbladder wall
thickening, or biliary dilatation.

Pancreas: Unremarkable. No pancreatic ductal dilatation or
surrounding inflammatory changes.

Spleen: Normal in size without significant abnormality.

Adrenals/Urinary Tract: Small, definitively benign left adrenal
myelolipoma (series 3, image 19). Atrophic appearance of the
bilateral kidneys. Unchanged calculus in the left renal pelvis
measuring 1.4 cm (series 5, image 81). Additional small
nonobstructive calculi of the inferior pole of the left kidney. No
hydronephrosis. Thickening of the urinary bladder wall with multiple
small diverticula, likely related to chronic outlet obstruction.

Stomach/Bowel: Stomach is within normal limits. Appendix appears
normal. No evidence of bowel wall thickening, distention, or
inflammatory changes. Descending and sigmoid diverticulosis.

Vascular/Lymphatic: Aortic atherosclerosis. No enlarged abdominal or
pelvic lymph nodes.

Reproductive: No mass or other significant abnormality.

Other: Fat containing inguinal hernia.  No abdominopelvic ascites.

Musculoskeletal: No acute or significant osseous findings. Extensive
posterior thoracolumbar fusion, incompletely imaged, including a
large postoperative seroma overlying the lower lumbar spine dorsally
(series 3, image 62).
IMPRESSION: 1. Unchanged calculus in the left renal pelvis measuring 1.4 cm.
Additional small nonobstructive calculi of the inferior pole of the
left kidney. No right-sided calculi hydronephrosis.
2. Cholelithiasis without evidence of acute cholecystitis.
3. Diverticulosis without evidence of acute diverticulitis.
4. Dense consolidation or atelectasis of the left lung base with
associated with a small, chronic, loculated pleural effusion,
unchanged compared to prior examination.
5. Cholelithiasis without evidence of acute cholecystitis.
6. Coronary artery disease.

Aortic Atherosclerosis (LEJ08-IH0.0).

ADDENDUM:
These results will be called to the ordering clinician or
representative by the Radiologist Assistant, and communication
documented in the PACS or [REDACTED].

*** End of Addendum ***
FINDINGS: Lower chest: Dense consolidation or atelectasis of the left lung
base with associated with a small, chronic, loculated pleural
effusion, unchanged compared to prior examination. Coronary artery
calcifications.

Hepatobiliary: No solid liver abnormality is seen. Multiple
gallstones in the dependent gallbladder. Gallbladder wall
thickening, or biliary dilatation.

Pancreas: Unremarkable. No pancreatic ductal dilatation or
surrounding inflammatory changes.

Spleen: Normal in size without significant abnormality.

Adrenals/Urinary Tract: Small, definitively benign left adrenal
myelolipoma (series 3, image 19). Atrophic appearance of the
bilateral kidneys. Unchanged calculus in the left renal pelvis
measuring 1.4 cm (series 5, image 81). Additional small
nonobstructive calculi of the inferior pole of the left kidney. No
hydronephrosis. Thickening of the urinary bladder wall with multiple
small diverticula, likely related to chronic outlet obstruction.

Stomach/Bowel: Stomach is within normal limits. Appendix appears
normal. No evidence of bowel wall thickening, distention, or
inflammatory changes. Descending and sigmoid diverticulosis.

Vascular/Lymphatic: Aortic atherosclerosis. No enlarged abdominal or
pelvic lymph nodes.

Reproductive: No mass or other significant abnormality.

Other: Fat containing inguinal hernia.  No abdominopelvic ascites.

Musculoskeletal: No acute or significant osseous findings. Extensive
posterior thoracolumbar fusion, incompletely imaged, including a
large postoperative seroma overlying the lower lumbar spine dorsally
(series 3, image 62).
IMPRESSION: 1. Unchanged calculus in the left renal pelvis measuring 1.4 cm.
Additional small nonobstructive calculi of the inferior pole of the
left kidney. No right-sided calculi hydronephrosis.
2. Cholelithiasis without evidence of acute cholecystitis.
3. Diverticulosis without evidence of acute diverticulitis.
4. Dense consolidation or atelectasis of the left lung base with
associated with a small, chronic, loculated pleural effusion,
unchanged compared to prior examination.
5. Cholelithiasis without evidence of acute cholecystitis.
6. Coronary artery disease.

Aortic Atherosclerosis (LEJ08-IH0.0).

## 2022-09-18 ENCOUNTER — Other Ambulatory Visit (HOSPITAL_BASED_OUTPATIENT_CLINIC_OR_DEPARTMENT_OTHER): Payer: Self-pay | Admitting: Physician Assistant

## 2022-09-18 DIAGNOSIS — E041 Nontoxic single thyroid nodule: Secondary | ICD-10-CM

## 2022-09-30 ENCOUNTER — Ambulatory Visit
Admission: RE | Admit: 2022-09-30 | Discharge: 2022-09-30 | Disposition: A | Discharge status: Home / Self Care | Payer: Medicare HMO | Source: Ambulatory Visit | Attending: Physician Assistant | Admitting: Physician Assistant

## 2022-09-30 DIAGNOSIS — E041 Nontoxic single thyroid nodule: Secondary | ICD-10-CM

## 2022-11-20 ENCOUNTER — Emergency Department: Payer: Medicare HMO

## 2022-11-20 ENCOUNTER — Other Ambulatory Visit: Payer: Self-pay

## 2022-11-20 ENCOUNTER — Inpatient Hospital Stay
Admission: EM | Admit: 2022-11-20 | Discharge: 2022-11-23 | DRG: 871 | Disposition: A | Discharge status: Home Health | Payer: Medicare HMO | Attending: Internal Medicine | Admitting: Internal Medicine

## 2022-11-20 DIAGNOSIS — Z7901 Long term (current) use of anticoagulants: Secondary | ICD-10-CM | POA: Diagnosis not present

## 2022-11-20 DIAGNOSIS — E785 Hyperlipidemia, unspecified: Secondary | ICD-10-CM | POA: Diagnosis present

## 2022-11-20 DIAGNOSIS — Z66 Do not resuscitate: Secondary | ICD-10-CM | POA: Diagnosis present

## 2022-11-20 DIAGNOSIS — N184 Chronic kidney disease, stage 4 (severe): Secondary | ICD-10-CM | POA: Diagnosis present

## 2022-11-20 DIAGNOSIS — R0902 Hypoxemia: Secondary | ICD-10-CM | POA: Diagnosis present

## 2022-11-20 DIAGNOSIS — W010XXA Fall on same level from slipping, tripping and stumbling without subsequent striking against object, initial encounter: Secondary | ICD-10-CM | POA: Diagnosis present

## 2022-11-20 DIAGNOSIS — Y92008 Other place in unspecified non-institutional (private) residence as the place of occurrence of the external cause: Secondary | ICD-10-CM

## 2022-11-20 DIAGNOSIS — Z7982 Long term (current) use of aspirin: Secondary | ICD-10-CM

## 2022-11-20 DIAGNOSIS — E041 Nontoxic single thyroid nodule: Secondary | ICD-10-CM | POA: Diagnosis present

## 2022-11-20 DIAGNOSIS — N400 Enlarged prostate without lower urinary tract symptoms: Secondary | ICD-10-CM | POA: Diagnosis present

## 2022-11-20 DIAGNOSIS — G4733 Obstructive sleep apnea (adult) (pediatric): Secondary | ICD-10-CM | POA: Diagnosis present

## 2022-11-20 DIAGNOSIS — A419 Sepsis, unspecified organism: Secondary | ICD-10-CM | POA: Diagnosis present

## 2022-11-20 DIAGNOSIS — Z79899 Other long term (current) drug therapy: Secondary | ICD-10-CM

## 2022-11-20 DIAGNOSIS — E1122 Type 2 diabetes mellitus with diabetic chronic kidney disease: Secondary | ICD-10-CM | POA: Diagnosis present

## 2022-11-20 DIAGNOSIS — I252 Old myocardial infarction: Secondary | ICD-10-CM

## 2022-11-20 DIAGNOSIS — R6520 Severe sepsis without septic shock: Secondary | ICD-10-CM | POA: Diagnosis present

## 2022-11-20 DIAGNOSIS — I2510 Atherosclerotic heart disease of native coronary artery without angina pectoris: Secondary | ICD-10-CM | POA: Diagnosis present

## 2022-11-20 DIAGNOSIS — Y9301 Activity, walking, marching and hiking: Secondary | ICD-10-CM | POA: Diagnosis present

## 2022-11-20 DIAGNOSIS — Z7985 Long-term (current) use of injectable non-insulin antidiabetic drugs: Secondary | ICD-10-CM

## 2022-11-20 DIAGNOSIS — F32A Depression, unspecified: Secondary | ICD-10-CM | POA: Diagnosis present

## 2022-11-20 DIAGNOSIS — J9601 Acute respiratory failure with hypoxia: Secondary | ICD-10-CM | POA: Diagnosis present

## 2022-11-20 DIAGNOSIS — K219 Gastro-esophageal reflux disease without esophagitis: Secondary | ICD-10-CM | POA: Diagnosis present

## 2022-11-20 DIAGNOSIS — N529 Male erectile dysfunction, unspecified: Secondary | ICD-10-CM | POA: Diagnosis present

## 2022-11-20 DIAGNOSIS — Z885 Allergy status to narcotic agent status: Secondary | ICD-10-CM

## 2022-11-20 DIAGNOSIS — G8929 Other chronic pain: Secondary | ICD-10-CM | POA: Diagnosis present

## 2022-11-20 DIAGNOSIS — D509 Iron deficiency anemia, unspecified: Secondary | ICD-10-CM | POA: Diagnosis present

## 2022-11-20 DIAGNOSIS — Z1152 Encounter for screening for COVID-19: Secondary | ICD-10-CM

## 2022-11-20 DIAGNOSIS — I4819 Other persistent atrial fibrillation: Secondary | ICD-10-CM | POA: Diagnosis present

## 2022-11-20 DIAGNOSIS — R296 Repeated falls: Secondary | ICD-10-CM | POA: Diagnosis present

## 2022-11-20 DIAGNOSIS — Z794 Long term (current) use of insulin: Secondary | ICD-10-CM

## 2022-11-20 DIAGNOSIS — Z951 Presence of aortocoronary bypass graft: Secondary | ICD-10-CM | POA: Diagnosis not present

## 2022-11-20 DIAGNOSIS — R0602 Shortness of breath: Secondary | ICD-10-CM | POA: Diagnosis present

## 2022-11-20 DIAGNOSIS — Z85828 Personal history of other malignant neoplasm of skin: Secondary | ICD-10-CM

## 2022-11-20 DIAGNOSIS — Z7902 Long term (current) use of antithrombotics/antiplatelets: Secondary | ICD-10-CM

## 2022-11-20 DIAGNOSIS — I129 Hypertensive chronic kidney disease with stage 1 through stage 4 chronic kidney disease, or unspecified chronic kidney disease: Secondary | ICD-10-CM | POA: Diagnosis present

## 2022-11-20 DIAGNOSIS — I272 Pulmonary hypertension, unspecified: Secondary | ICD-10-CM | POA: Diagnosis present

## 2022-11-20 DIAGNOSIS — Z91040 Latex allergy status: Secondary | ICD-10-CM

## 2022-11-20 DIAGNOSIS — Z955 Presence of coronary angioplasty implant and graft: Secondary | ICD-10-CM

## 2022-11-20 DIAGNOSIS — F419 Anxiety disorder, unspecified: Secondary | ICD-10-CM | POA: Diagnosis present

## 2022-11-20 DIAGNOSIS — J189 Pneumonia, unspecified organism: Secondary | ICD-10-CM | POA: Diagnosis present

## 2022-11-20 DIAGNOSIS — Z86718 Personal history of other venous thrombosis and embolism: Secondary | ICD-10-CM

## 2022-11-20 DIAGNOSIS — Z981 Arthrodesis status: Secondary | ICD-10-CM

## 2022-11-20 DIAGNOSIS — Z8616 Personal history of COVID-19: Secondary | ICD-10-CM

## 2022-11-20 LAB — URINALYSIS, ROUTINE W REFLEX MICROSCOPIC
Bacteria, UA: NONE SEEN
Bilirubin Urine: NEGATIVE
Glucose, UA: >=500 mg/dL — AB
Ketones, ur: 20 mg/dL — AB
Leukocytes,Ua: NEGATIVE
Nitrite: NEGATIVE
Protein, ur: >=300 mg/dL — AB
Specific Gravity, Urine: 1.024 (ref 1.005–1.030)
pH: 5 (ref 5.0–8.0)

## 2022-11-20 LAB — CBC WITH DIFFERENTIAL/PLATELET
Abs Immature Granulocytes: 0.14 10*3/uL — ABNORMAL HIGH (ref 0.00–0.07)
Basophils Absolute: 0.1 10*3/uL (ref 0.0–0.1)
Basophils Relative: 0 %
Eosinophils Absolute: 0 10*3/uL (ref 0.0–0.5)
Eosinophils Relative: 0 %
HCT: 47.6 % (ref 39.0–52.0)
Hemoglobin: 15.7 g/dL (ref 13.0–17.0)
Immature Granulocytes: 1 %
Lymphocytes Relative: 2 %
Lymphs Abs: 0.5 10*3/uL — ABNORMAL LOW (ref 0.7–4.0)
MCH: 31 pg (ref 26.0–34.0)
MCHC: 33 g/dL (ref 30.0–36.0)
MCV: 94.1 fL (ref 80.0–100.0)
Monocytes Absolute: 1.5 10*3/uL — ABNORMAL HIGH (ref 0.1–1.0)
Monocytes Relative: 7 %
Neutro Abs: 17.9 10*3/uL — ABNORMAL HIGH (ref 1.7–7.7)
Neutrophils Relative %: 90 %
Platelets: 260 10*3/uL (ref 150–400)
RBC: 5.06 MIL/uL (ref 4.22–5.81)
RDW: 13.4 % (ref 11.5–15.5)
WBC: 20 10*3/uL — ABNORMAL HIGH (ref 4.0–10.5)
nRBC: 0 % (ref 0.0–0.2)

## 2022-11-20 LAB — TROPONIN I (HIGH SENSITIVITY)
Troponin I (High Sensitivity): 37 ng/L — ABNORMAL HIGH (ref <18)
Troponin I (High Sensitivity): 59 ng/L — ABNORMAL HIGH (ref <18)

## 2022-11-20 LAB — TYPE AND SCREEN
ABO/RH(D): A POS
Antibody Screen: NEGATIVE

## 2022-11-20 LAB — GLUCOSE, CAPILLARY: Glucose-Capillary: 235 mg/dL — ABNORMAL HIGH (ref 70–99)

## 2022-11-20 LAB — CK: Total CK: 644 U/L — ABNORMAL HIGH (ref 49–397)

## 2022-11-20 LAB — COMPREHENSIVE METABOLIC PANEL
ALT: 20 U/L (ref 0–44)
AST: 35 U/L (ref 15–41)
Albumin: 3.9 g/dL (ref 3.5–5.0)
Alkaline Phosphatase: 73 U/L (ref 38–126)
Anion gap: 15 (ref 5–15)
BUN: 23 mg/dL (ref 8–23)
CO2: 22 mmol/L (ref 22–32)
Calcium: 8.7 mg/dL — ABNORMAL LOW (ref 8.9–10.3)
Chloride: 102 mmol/L (ref 98–111)
Creatinine, Ser: 1.49 mg/dL — ABNORMAL HIGH (ref 0.61–1.24)
GFR, Estimated: 47 mL/min — ABNORMAL LOW (ref >60)
Glucose, Bld: 272 mg/dL — ABNORMAL HIGH (ref 70–99)
Potassium: 4.1 mmol/L (ref 3.5–5.1)
Sodium: 139 mmol/L (ref 135–145)
Total Bilirubin: 1.4 mg/dL — ABNORMAL HIGH (ref 0.3–1.2)
Total Protein: 7.2 g/dL (ref 6.5–8.1)

## 2022-11-20 LAB — LACTIC ACID, PLASMA
Lactic Acid, Venous: 3 mmol/L (ref 0.5–1.9)
Lactic Acid, Venous: 1.6 mmol/L (ref 0.5–1.9)

## 2022-11-20 LAB — BRAIN NATRIURETIC PEPTIDE: B Natriuretic Peptide: 212.7 pg/mL — ABNORMAL HIGH (ref 0.0–100.0)

## 2022-11-20 LAB — CBG MONITORING, ED: Glucose-Capillary: 241 mg/dL — ABNORMAL HIGH (ref 70–99)

## 2022-11-20 LAB — SARS CORONAVIRUS 2 BY RT PCR: SARS Coronavirus 2 by RT PCR: NEGATIVE

## 2022-11-20 MED ORDER — APIXABAN 2.5 MG PO TABS: 2.5000 mg | ORAL_TABLET | Freq: Two times a day (BID) | ORAL | Status: DC

## 2022-11-20 MED ORDER — INSULIN ASPART 100 UNIT/ML IJ SOLN
0.0000 [IU] | Freq: Every day | INTRAMUSCULAR | Status: DC
  Administered 2022-11-20 – 2022-11-22 (×2): 2 [IU] via SUBCUTANEOUS
  Filled 2022-11-20 (×2): qty 1

## 2022-11-20 MED ORDER — SODIUM CHLORIDE 0.9 % IV BOLUS
1000.0000 mL | Freq: Once | INTRAVENOUS | Status: AC
  Administered 2022-11-20: 1000 mL via INTRAVENOUS

## 2022-11-20 MED ORDER — SODIUM CHLORIDE 0.9 % IV SOLN
1.0000 g | INTRAVENOUS | Status: DC
  Administered 2022-11-20 – 2022-11-22 (×3): 1 g via INTRAVENOUS
  Filled 2022-11-20 (×4): qty 10

## 2022-11-20 MED ORDER — SODIUM CHLORIDE 0.9 % IV SOLN
500.0000 mg | INTRAVENOUS | Status: DC
  Administered 2022-11-20 – 2022-11-21 (×2): 500 mg via INTRAVENOUS
  Filled 2022-11-20 (×3): qty 5

## 2022-11-20 MED ORDER — FERROUS SULFATE 325 (65 FE) MG PO TABS
325.0000 mg | ORAL_TABLET | Freq: Every day | ORAL | Status: DC
  Administered 2022-11-21 – 2022-11-23 (×3): 325 mg via ORAL
  Filled 2022-11-20 (×3): qty 1

## 2022-11-20 MED ORDER — PIPERACILLIN-TAZOBACTAM 3.375 G IVPB
3.3750 g | Freq: Once | INTRAVENOUS | Status: AC
  Administered 2022-11-20: 3.375 g via INTRAVENOUS

## 2022-11-20 MED ORDER — ONDANSETRON HCL 4 MG/2ML IJ SOLN
4.0000 mg | Freq: Once | INTRAMUSCULAR | Status: AC
  Administered 2022-11-20: 4 mg via INTRAVENOUS
  Filled 2022-11-20: qty 2

## 2022-11-20 MED ORDER — VITAMIN D 25 MCG (1000 UNIT) PO TABS
1000.0000 [IU] | ORAL_TABLET | Freq: Every day | ORAL | Status: DC
  Administered 2022-11-21 – 2022-11-23 (×3): 1000 [IU] via ORAL
  Filled 2022-11-20 (×3): qty 1

## 2022-11-20 MED ORDER — VANCOMYCIN HCL 2000 MG/400ML IV SOLN
2000.0000 mg | Freq: Once | INTRAVENOUS | Status: AC
  Administered 2022-11-20: 2000 mg via INTRAVENOUS
  Filled 2022-11-20: qty 400

## 2022-11-20 MED ORDER — ACETAMINOPHEN 325 MG PO TABS
650.0000 mg | ORAL_TABLET | Freq: Four times a day (QID) | ORAL | Status: DC | PRN
  Administered 2022-11-20 – 2022-11-21 (×3): 650 mg via ORAL
  Filled 2022-11-20 (×3): qty 2

## 2022-11-20 MED ORDER — TRAMADOL HCL 50 MG PO TABS: 50.0000 mg | ORAL_TABLET | Freq: Four times a day (QID) | ORAL | Status: DC | PRN

## 2022-11-20 MED ORDER — ASPIRIN 81 MG PO TBEC
81.0000 mg | DELAYED_RELEASE_TABLET | Freq: Every day | ORAL | Status: DC
  Administered 2022-11-20 – 2022-11-23 (×4): 81 mg via ORAL
  Filled 2022-11-20 (×4): qty 1

## 2022-11-20 MED ORDER — BUPROPION HCL ER (XL) 150 MG PO TB24
150.0000 mg | ORAL_TABLET | Freq: Every day | ORAL | Status: DC
  Administered 2022-11-20 – 2022-11-23 (×4): 150 mg via ORAL
  Filled 2022-11-20 (×4): qty 1

## 2022-11-20 MED ORDER — PIPERACILLIN-TAZOBACTAM 3.375 G IVPB
3.3750 g | Freq: Three times a day (TID) | INTRAVENOUS | Status: DC
  Filled 2022-11-20: qty 50

## 2022-11-20 MED ORDER — BRIMONIDINE TARTRATE 0.2 % OP SOLN
1.0000 [drp] | Freq: Three times a day (TID) | OPHTHALMIC | Status: DC
  Administered 2022-11-20 – 2022-11-23 (×9): 1 [drp] via OPHTHALMIC
  Filled 2022-11-20: qty 5

## 2022-11-20 MED ORDER — INSULIN ASPART 100 UNIT/ML IJ SOLN
0.0000 [IU] | Freq: Three times a day (TID) | INTRAMUSCULAR | Status: DC
  Administered 2022-11-21: 1 [IU] via SUBCUTANEOUS
  Administered 2022-11-21 – 2022-11-22 (×2): 2 [IU] via SUBCUTANEOUS
  Administered 2022-11-22 (×2): 1 [IU] via SUBCUTANEOUS
  Administered 2022-11-23 (×2): 3 [IU] via SUBCUTANEOUS
  Filled 2022-11-20 (×7): qty 1

## 2022-11-20 MED ORDER — INSULIN GLARGINE-YFGN 100 UNIT/ML ~~LOC~~ SOLN
15.0000 [IU] | Freq: Two times a day (BID) | SUBCUTANEOUS | Status: DC
  Administered 2022-11-20 – 2022-11-23 (×6): 15 [IU] via SUBCUTANEOUS
  Filled 2022-11-20 (×7): qty 0.15

## 2022-11-20 MED ORDER — AMLODIPINE BESYLATE 10 MG PO TABS
10.0000 mg | ORAL_TABLET | Freq: Every day | ORAL | Status: DC
  Administered 2022-11-20 – 2022-11-23 (×4): 10 mg via ORAL
  Filled 2022-11-20 (×2): qty 1
  Filled 2022-11-20: qty 2
  Filled 2022-11-20: qty 1

## 2022-11-20 MED ORDER — INFLUENZA VAC A&B SURF ANT ADJ 0.5 ML IM SUSY
0.5000 mL | PREFILLED_SYRINGE | INTRAMUSCULAR | Status: DC
  Filled 2022-11-20: qty 0.5

## 2022-11-20 MED ORDER — FINASTERIDE 5 MG PO TABS
5.0000 mg | ORAL_TABLET | Freq: Every day | ORAL | Status: DC
  Administered 2022-11-20 – 2022-11-23 (×4): 5 mg via ORAL
  Filled 2022-11-20 (×4): qty 1

## 2022-11-20 MED ORDER — PRAVASTATIN SODIUM 20 MG PO TABS
40.0000 mg | ORAL_TABLET | Freq: Every day | ORAL | Status: DC
  Administered 2022-11-20 – 2022-11-22 (×3): 40 mg via ORAL
  Filled 2022-11-20 (×3): qty 2

## 2022-11-20 MED ORDER — PANTOPRAZOLE SODIUM 40 MG PO TBEC
40.0000 mg | DELAYED_RELEASE_TABLET | Freq: Every day | ORAL | Status: DC
  Administered 2022-11-20 – 2022-11-23 (×4): 40 mg via ORAL
  Filled 2022-11-20 (×4): qty 1

## 2022-11-20 MED ORDER — TAMSULOSIN HCL 0.4 MG PO CAPS
0.4000 mg | ORAL_CAPSULE | Freq: Every day | ORAL | Status: DC
  Administered 2022-11-20 – 2022-11-22 (×3): 0.4 mg via ORAL
  Filled 2022-11-20 (×3): qty 1

## 2022-11-20 MED ORDER — DABIGATRAN ETEXILATE MESYLATE 75 MG PO CAPS
75.0000 mg | ORAL_CAPSULE | Freq: Two times a day (BID) | ORAL | Status: DC
  Administered 2022-11-20 – 2022-11-23 (×7): 75 mg via ORAL
  Filled 2022-11-20 (×7): qty 1

## 2022-11-20 MED ORDER — ONDANSETRON HCL 4 MG/2ML IJ SOLN
4.0000 mg | Freq: Four times a day (QID) | INTRAMUSCULAR | Status: DC | PRN
  Administered 2022-11-20: 4 mg via INTRAVENOUS
  Filled 2022-11-20: qty 2

## 2022-11-20 MED ORDER — ESCITALOPRAM OXALATE 10 MG PO TABS: 15.0000 mg | ORAL_TABLET | Freq: Every day | ORAL | Status: DC

## 2022-11-20 NOTE — ED Notes (Signed)
ERP notified of pt lactic, no new orders.

## 2022-11-20 NOTE — Consult Note (Signed)
PHARMACY - BRIEF ANTIBIOTIC NOTE   Pharmacy has received consult(s) for Zosyn and vancomycin from an ED provider. The patient's profile has been reviewed for ht/wt/allergies/indication/available labs.    One time order(s) placed for: Zosyn 3.375 g IV Vancomycin 2000 mg IV  Further antibiotics/pharmacy consults should be ordered by admitting physician if indicated.                       Thank you,  Will M. Dareen Piano, PharmD Clinical Pharmacist 11/20/2022 10:34 AM

## 2022-11-20 NOTE — ED Notes (Signed)
Warm blankets applied, no bear hugger at this time per Dr. Penne Lash. Pt daughter at bedside. Socks applied, pt cleaned up. Pt arrived with vomit on shirt and had urine and bowel movement.

## 2022-11-20 NOTE — ED Notes (Signed)
RN attempted to get medications out of cabinet, and cabinet is broken. Will attempt again.

## 2022-11-20 NOTE — ED Provider Notes (Signed)
[facility] Provider Note    Event Date/Time   First MD Initiated Contact with Patient 11/20/22 [phone]     (approximate)   History   Fall   HPI  Derek Andersen is a 82 y.o. male  here with fall, weakness. Pt has h/o severe degenerative back disease, recurrent falls 2/2 same. Reports he was walking in his driveway yesterday when he lost balance and fell. He landed on his back. He was unable to get back up and was out overnight. Neighbor came to check on him today and found him, EMS called. Pt was cold, slightly confused on their arrival. Main complaint is acute on chronic lower back pain. Pt feels mildly SOB as well. Reports he was overall well prior to the fall. He does take anticoagulation.      Physical Exam   Triage Vital Signs: ED Triage Vitals [11/20/22 0810]  Encounter Vitals Group     BP      Systolic BP Percentile      Diastolic BP Percentile      Pulse      Resp      Temp      Temp src      SpO2 90 %     Weight      Height      Head Circumference      Peak Flow      Pain Score      Pain Loc      Pain Education      Exclude from Growth Chart     Most recent vital signs: Vitals:   11/20/22 1710 11/20/22 1746  BP: (!) 122/55 (!) 127/53  Pulse: 92 92  Resp: (!) 24 16  Temp: 99.4 F (37.4 C) 98.6 F (37 C)  SpO2: 94% 93%     General: Awake, disheveled, dried emesis on clothes. CV:  Good peripheral perfusion. RRR. Resp:  Mild tachypnea, diminished aeration diffusely. No wheezes. Abd:  No distention. Umbilical hernia that is reducible and nontender.  Other:  Alert, oriented x 4. CNII-XII intact. Strength 5/5 bl UE, subjectively weak but at least antigravity in L>R LE which is chronic.    ED Results / Procedures / Treatments   Labs (all labs ordered are listed, but only abnormal results are displayed) Labs Reviewed  CBC WITH DIFFERENTIAL/PLATELET - Abnormal; Notable for the following components:      Result Value   WBC  20.0 (*)    Neutro Abs 17.9 (*)    Lymphs Abs 0.5 (*)    Monocytes Absolute 1.5 (*)    Abs Immature Granulocytes 0.14 (*)    All other components within normal limits  COMPREHENSIVE METABOLIC PANEL - Abnormal; Notable for the following components:   Glucose, Bld 272 (*)    Creatinine, Ser 1.49 (*)    Calcium 8.7 (*)    Total Bilirubin 1.4 (*)    GFR, Estimated 47 (*)    All other components within normal limits  BRAIN NATRIURETIC PEPTIDE - Abnormal; Notable for the following components:   B Natriuretic Peptide 212.7 (*)    All other components within normal limits  CK - Abnormal; Notable for the following components:   Total CK 644 (*)    All other components within normal limits  LACTIC ACID, PLASMA - Abnormal; Notable for the following components:   Lactic Acid, Venous 3.0 (*)    All other components within normal limits  URINALYSIS, ROUTINE W REFLEX MICROSCOPIC -  Abnormal; Notable for the following components:   Color, Urine YELLOW (*)    APPearance HAZY (*)    Glucose, UA >=500 (*)    Hgb urine dipstick LARGE (*)    Ketones, ur 20 (*)    Protein, ur >=300 (*)    All other components within normal limits  CBG MONITORING, ED - Abnormal; Notable for the following components:   Glucose-Capillary 241 (*)    All other components within normal limits  TROPONIN I (HIGH SENSITIVITY) - Abnormal; Notable for the following components:   Troponin I (High Sensitivity) 37 (*)    All other components within normal limits  TROPONIN I (HIGH SENSITIVITY) - Abnormal; Notable for the following components:   Troponin I (High Sensitivity) 59 (*)    All other components within normal limits  SARS CORONAVIRUS 2 BY RT PCR  CULTURE, BLOOD (SINGLE)  MRSA NEXT GEN BY PCR, NASAL  LACTIC ACID, PLASMA  BASIC METABOLIC PANEL  CBC  HEMOGLOBIN A1C  TYPE AND SCREEN     EKG Sinus tachycardia though limited by artifact, LVH. Repeat ordered. No obvious ST elevations or depressions.   RADIOLOGY CT  Head: No acute abnormality CT C Spine: Negative CT Chest: PNA of LLL CT T Spine: No acute fx CT L Spine: No acute fx   I also independently reviewed and agree with radiologist interpretations.   PROCEDURES:  Critical Care performed: Yes, see critical care procedure note(s)  .Critical Care  Performed by: Shaune Pollack, MD Authorized by: Shaune Pollack, MD   Critical care provider statement:    Critical care time (minutes):  30   Critical care time was exclusive of:  Separately billable procedures and treating other patients   Critical care was necessary to treat or prevent imminent or life-threatening deterioration of the following conditions:  Circulatory failure, cardiac failure, respiratory failure and sepsis   Critical care was time spent personally by me on the following activities:  Development of treatment plan with patient or surrogate, discussions with consultants, evaluation of patient's response to treatment, examination of patient, ordering and review of laboratory studies, ordering and review of radiographic studies, ordering and performing treatments and interventions, pulse oximetry, re-evaluation of patient's condition and review of old charts     MEDICATIONS ORDERED IN ED: Medications  ferrous sulfate tablet 325 mg (has no administration in time range)  finasteride (PROSCAR) tablet 5 mg (5 mg Oral Given 11/20/22 1535)  pantoprazole (PROTONIX) EC tablet 40 mg (40 mg Oral Given 11/20/22 1536)  pravastatin (PRAVACHOL) tablet 40 mg (has no administration in time range)  tamsulosin (FLOMAX) capsule 0.4 mg (has no administration in time range)  cholecalciferol (VITAMIN D3) 25 MCG (1000 UNIT) tablet 1,000 Units (has no administration in time range)  buPROPion (WELLBUTRIN XL) 24 hr tablet 150 mg (150 mg Oral Given 11/20/22 1536)  brimonidine (ALPHAGAN) 0.2 % ophthalmic solution 1 drop (1 drop Both Eyes Given 11/20/22 1611)  aspirin EC tablet 81 mg (81 mg Oral Given 11/20/22  1536)  amLODipine (NORVASC) tablet 10 mg (10 mg Oral Given 11/20/22 1534)  acetaminophen (TYLENOL) tablet 650 mg (has no administration in time range)  dabigatran (PRADAXA) capsule 75 mg (75 mg Oral Given 11/20/22 1537)  influenza vaccine adjuvanted (FLUAD) injection 0.5 mL (has no administration in time range)  insulin aspart (novoLOG) injection 0-6 Units (has no administration in time range)  insulin aspart (novoLOG) injection 0-5 Units (has no administration in time range)  insulin glargine-yfgn (SEMGLEE) injection 15 Units (has no  administration in time range)  azithromycin (ZITHROMAX) 500 mg in sodium chloride 0.9 % 250 mL IVPB (has no administration in time range)  cefTRIAXone (ROCEPHIN) 1 g in sodium chloride 0.9 % 100 mL IVPB (has no administration in time range)  ondansetron (ZOFRAN) injection 4 mg (4 mg Intravenous Given 11/20/22 0834)  sodium chloride 0.9 % bolus 1,000 mL (0 mLs Intravenous Stopped 11/20/22 1439)  vancomycin (VANCOREADY) IVPB 2000 mg/400 mL (0 mg Intravenous Stopped 11/20/22 1636)  piperacillin-tazobactam (ZOSYN) IVPB 3.375 g (0 g Intravenous Stopped 11/20/22 1429)     IMPRESSION / MDM / ASSESSMENT AND PLAN / ED COURSE  I reviewed the triage vital signs and the nursing notes.                              Differential diagnosis includes, but is not limited to: fall from weakness, now with hypothermia, rhabdo, AKI, PNA, PTX, HTX, ACS; occult UTI causing weakness and fall, other infection  Patient's presentation is most consistent with acute presentation with potential threat to life or bodily function.  The patient is on the cardiac monitor to evaluate for evidence of arrhythmia and/or significant heart rate changes   81 yo M with PMHx as above here with hypothermia, fatigue, vomiting, and fall. Re: fall - no apparent traumatic injuries noted on CTs, reviewed as above. No new focal deficits or weakness to suggest occult cord injury. Pt does, however, have significant  leukocytosis, hypoxia, and is hypothermic. Trop minimally elevated likely demand-related. CK 644. LA 3.0. Suspect some of this is from down time overnight, but will cover for possible sepsis as he does meet criteria. Will be cautious w/ fluids in setting of his CHF.  CT Chest shows LLL PNA. Suspect possible aspiration. Pt will be admitted.   FINAL CLINICAL IMPRESSION(S) / ED DIAGNOSES   Final diagnoses:  Acute respiratory failure with hypoxemia (HCC)     Rx / DC Orders   ED Discharge Orders     None        Note:  This document was prepared using Dragon voice recognition software and may include unintentional dictation errors.   Shaune Pollack, MD 11/20/22 [phone]

## 2022-11-20 NOTE — H&P (Signed)
History and Physical    Patient: Derek Andersen:[id number] DOB: Aug 23, 1940 DOA: 11/20/2022 DOS: the patient was seen and examined on 11/20/2022 PCP: Kandyce Rud, MD  Patient coming from: Home  Chief Complaint: Status post fall Chief Complaint  Patient presents with   Fall   HPI: Derek Andersen is a 82 y.o. male with medical history significant of atrial fibrillation, coronary artery disease status post CABG, BPH, hypertension, hyperlipidemia, diabetes mellitus, and others as listed below who was otherwise well until 6:30 PM yesterday when he was walking with his walker in his driveway and apparently lost balance falling and hitting the side of his body according to patient he could not get up and he laid throughout the night until this morning when patient was able to get help when the son came by to check up on him and subsequently brought into the emergency room for further management.  Patient denies any chest pain, headache, nausea vomiting abdominal pain or cough.  According to patient he did not lose consciousness.  ED course: Upon arrival to the emergency room patient had temperature 96.5, respiratory rate 18 pulse 99, blood pressure 149/84 saturating 100% on 2 L. CT scan of the thoracic spine did not show any acute fractures CT scan of the lungs showed left lower lobe consolidation CT scan of the head did not show any acute intracranial pathology however it showed 3.2 cm hypodense left thyroid gland nodule with recommendation to have a thyroid ultrasound.   Review of Systems: As mentioned in the history of present illness. All other systems reviewed and are negative. Past Medical History:  Diagnosis Date   Arthritis    Atrial fibrillation (HCC)    a.) CHA2DS2-VASc = 7 (age x 2, HTN, DVT x 2, prior MI, T2DM). b.) s/p TEE with cardioversion (200J x 2) 12/06/2013. c.) chronically anticoagulated using apixaban   BPH (benign prostatic hyperplasia)    CAD (coronary artery  disease)    a.) MI 12/31/83 -> LHC: 95% pLAD-1, 100% pLAD-2, 50% mLAD. b.) LHC 05/08/88: 50 pRCA, 95% mLAD. c.) LHC 02/17/02: 25% RCA, 25% LM, 75% OM1; PCI -> 0.25 x 12mm Medtronic stent to OM1. d.) LHC 08/17/02: <25% OM1, 25% RI, 25% LAD. e.) LHC 09/02/03: 100% pLAD, 75% mLAD-1, 50% mLAD-2, <25% pRCA, <25% LM, 25% pLCx, <25% OM2, 25% RI. f.) LHC 11/23/13: 100% LAD; ref to CVTS. g.) MIDCAB (LIMA-LAD) 11/29/13   CKD (chronic kidney disease), stage IV (HCC)    Deep vein thrombosis (DVT) of right lower extremity (HCC) 1985   DISH (diffuse idiopathic skeletal hyperostosis)    Erectile dysfunction    Gait instability    GERD (gastroesophageal reflux disease)    History of 2019 novel coronavirus disease (COVID-19) 11/20/2018   HLD (hyperlipidemia)    Hypertension    IDA (iron deficiency anemia)    Long term current use of anticoagulant    a.) Apixaban   MI (myocardial infarction) (HCC) 12/31/1983   a.) treated in [place], [place]. b.) LHC: 95% pLAD-1, 100% pLAD-2, 50% mLAD; no intervention.   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 b.) gun in the home   T2DM (type 2 diabetes mellitus) [facility])    Past Surgical History:  Procedure Laterality Date   ANKLE SURGERY Right 1989   plate in ankle   BACK SURGERY     11x Degernerative disk   CARDIAC CATHETERIZATION Left 08/17/2002   Procedure: CARDIAC CATHETERIZATION; Location: [facility]; Surgeon: Theresia Bough, MD   CARDIAC CATHETERIZATION Left 11/23/2013   Procedure: CARDIAC CATHETERIZATION; Location: [facility]; Surgeon: Yetta Barre, MD   CARDIAC CATHETERIZATION Left 12/31/1983   Procedure: CARDIAC CATHETERIZATION; Location:  [facility]; Surgeon: Eugenia Pancoast, MD   CARDIAC CATHETERIZATION Left 05/08/1988   Procedure: CARDIAC CATHETERIZATION; Location: [facility]; Surgeon: Jamesetta So, MD   CARDIAC CATHETERIZATION Left 09/02/2003   Procedure: CARDIAC CATHETERIZATION; Location: [facility]; Surgeon: Emilio Math, MD   CORONARY ANGIOPLASTY WITH STENT PLACEMENT Left 02/17/2002   Procedure: CORONARY ANGIOPLASTY WITH STENT PLACEMENT (0.25 x 12 mm Medtronic stent to OM1); Location: [facility]   CYSTOSCOPY W/ RETROGRADES  02/09/2021   Procedure: CYSTOSCOPY WITH RETROGRADE PYELOGRAM;  Surgeon: Sondra Come, MD;  Location: [facility];  Service: Urology;;   CYSTOSCOPY/URETEROSCOPY/HOLMIUM LASER/STENT PLACEMENT Left 02/09/2021   Procedure: CYSTOSCOPY/URETEROSCOPY/HOLMIUM LASER/STENT PLACEMENT;  Surgeon: Sondra Come, MD;  Location: [facility];  Service: Urology;  Laterality: Left;   MINIMALLY INVASIVE DIRECT CORONARY ARTERY BYPASS (MIDCAB; LIMA -LAD) Left 11/29/2013   Procedure: MINIMALLY INVASIVE DIRECT CORONARY ARTERY BYPASS (MIDCAB; LIMA -LAD); Location: [facility]   Social History:  reports that he has never smoked. He has never used smokeless tobacco. He reports that he does not currently use alcohol. He reports that he does not currently use drugs.  Allergies  Allergen Reactions   Oxycodone     Patient states "you don't know where you are at, and it screws you up. It also gives crazy dreams."   Latex     History reviewed. No pertinent family history.  Prior to Admission medications   Medication Sig Start Date End Date Taking? Authorizing Provider  acetaminophen (TYLENOL) 325 MG tablet Take 650 mg by mouth every 6 (six) hours as needed for headache.    [provider]  amLODipine (NORVASC) 10 MG tablet Take 10 mg by mouth daily. 03/01/19   [provider]  aspirin 81 MG EC tablet Take 81 mg by mouth daily.    [provider]  Blood Glucose Monitoring Suppl (GLUCOCOM BLOOD GLUCOSE MONITOR) DEVI 1 each as  directed 08/09/21   [provider]  Blood Glucose Monitoring Suppl (ONE TOUCH ULTRA 2) w/Device KIT as directed. 08/09/21   [provider]  brimonidine (ALPHAGAN) 0.2 % ophthalmic solution Place 1 drop into both eyes 3 (three) times daily. 11/30/20   [provider]  buPROPion (WELLBUTRIN XL) 150 MG 24 hr tablet Take 1 tablet by mouth daily. 04/02/21   [provider]  calcium carbonate (TUMS EX) 750 MG chewable tablet Chew 750 mg by mouth in the morning and at bedtime.    [provider]  Cholecalciferol 25 MCG (1000 UT) capsule Take 1 tablet by mouth daily. 05/30/09   [provider]  diclofenac Sodium (VOLTAREN) 1 % GEL Apply 2 g topically in the morning and at bedtime.    [provider]  ELIQUIS 2.5 MG TABS tablet Take 2.5 mg by mouth 2 (two) times daily. 05/06/19   [provider]  escitalopram (LEXAPRO) 10 MG tablet Take 15 mg by mouth daily. 03/29/19   [provider]  ferrous sulfate 325 (65 FE) MG  tablet Take 325 mg by mouth daily.    [provider]  finasteride (PROSCAR) 5 MG tablet Take 1 tablet by mouth daily. 08/07/21   [provider]  fluticasone (FLONASE) 50 MCG/ACT nasal spray SPRAY 2 SPRAYS INTO EACH NOSTRIL EVERY DAY 05/14/21   [provider]  furosemide (LASIX) 20 MG tablet Hold until PCP followup due to diarrhea and AKI. Patient taking differently: Take 20 mg by mouth daily. 07/07/19   Darlin Priestly, MD  Glucagon 3 MG/DOSE POWD 3 mg once    [provider]  hydrocortisone 2.5 % cream Apply 1 application topically daily. 05/23/19   [provider]  insulin glargine (LANTUS) 100 UNIT/ML injection Inject into the skin.    [provider]  ketoconazole (NIZORAL) 2 % shampoo Apply 1 application topically 2 (two) times a week. 02/25/18   [provider]  magnesium oxide (MAG-OX) 400 MG tablet Take 400 mg by mouth daily.    [provider]   Omega-3 Fatty Acids (FISH OIL) 1200 MG CAPS Take 2,400 mg by mouth in the morning and at bedtime.    [provider]  [facility] ULTRA test strip 3 (three) times daily. as directed 06/01/21   [provider]  pantoprazole (PROTONIX) 40 MG tablet Take 40 mg by mouth daily. 04/19/19   [provider]  pravastatin (PRAVACHOL) 40 MG tablet Take 40 mg by mouth daily.    [provider]  tamsulosin (FLOMAX) 0.4 MG CAPS capsule Take 0.4 mg by mouth daily. 04/15/19   [provider]  traMADol (ULTRAM) 50 MG tablet Take by mouth. 05/15/21   [provider]  TRULICITY 0.75 MG/0.5ML SOPN Inject 0.75 mg into the skin once a week. 09/07/20   [provider]    Physical Exam: Vitals:   11/20/22 3664 11/20/22 0820 11/20/22 0823 11/20/22 0829  BP:  (!) 149/84    Pulse:  99    Resp:  (!) 30    Temp:  97.9 F (36.6 C)  (!) 96.5 F (35.8 C)  TempSrc:  Oral  Rectal  SpO2: 90% 92%    Weight:   103.9 kg   Height:   5\' 6"  (1.676 m)    General:Elderly male seen laying in bed requiring intranasal oxygen CVS: Irregular irregular no murmur appreciated Respiratory: Decreased air entry especially at the bases Abdomen: Obese moves with respiration Musculoskeletal: Moving all 4 extremities however slightly weaker on the left lower extremity  Data Reviewed: I have reviewed patient's CT scan as shown below  CT scan of the thoracic spine did not show any acute fractures CT scan of the lungs showed left lower lobe consolidation CT scan of the head did not show any acute intracranial pathology however it showed 3.2 cm hypodense left thyroid gland nodule with recommendation to have a thyroid ultrasound.  Have also reviewed patient's CBC as well as BMP results    Latest Ref Rng & Units 11/20/2022    8:28 AM 07/07/2019    4:47 AM 07/03/2019    5:37 AM  CBC  WBC 4.0 - 10.5 K/uL 20.0  8.8  9.4   Hemoglobin 13.0 - 17.0 g/dL 40.3  47.4  25.9   Hematocrit 39.0 -  52.0 % 47.6  38.5  38.1   Platelets 150 - 400 K/uL 260  318  183        Latest Ref Rng & Units 11/20/2022    8:28 AM 07/07/2019    4:47 AM 07/05/2019  6:33 AM  CMP  Glucose 70 - 99 mg/dL 409  811  914   BUN 8 - 23 mg/dL 23  31  30    Creatinine 0.61 - 1.24 mg/dL 7.82  9.56  2.13   Sodium 135 - 145 mmol/L 139  140  140   Potassium 3.5 - 5.1 mmol/L 4.1  3.0  3.6   Chloride 98 - 111 mmol/L 102  105  106   CO2 22 - 32 mmol/L 22  27  26    Calcium 8.9 - 10.3 mg/dL 8.7  8.9  8.9   Total Protein 6.5 - 8.1 g/dL 7.2     Total Bilirubin 0.3 - 1.2 mg/dL 1.4     Alkaline Phos 38 - 126 U/L 73     AST 15 - 41 U/L 35     ALT 0 - 44 U/L 20       Assessment and Plan:  Sepsis secondary to hypoxia secondary to community-acquired pneumonia Patient met SIRS criteria in the setting of WBC 20, pulse of 99 with CT scan showing findings of pneumonia, lactic acid 3.0 CT scan of the chest results showed findings of left lung consolidation We will cover with ceftriaxone and azithromycin Continue supplemental oxygen to maintain appropriate saturation and wean off as tolerated Follow-up on culture results  Thyroid nodule CT scan of the head did not show any acute intracranial pathology however it showed 3.2 cm hypodense left thyroid gland nodule with recommendation to have a thyroid ultrasound. Outpatient thyroid ultrasound   Chronic kidney disease stage IV Creatinine currently at baseline Continue monitoring renal function closely  Chronic persistent atrial fibrillation Patient on dabigatran Continue to monitor rate closely Placed on telemetry  GERD-continue pantoprazole  Hyperlipidemia-continue statin therapy  Essential hypertension Continue to monitor blood pressure closely Continue home dose of amlodipine  Diabetes mellitus type II Continue carb controlled diet Continue current insulin regimen Monitor glucose level closely  Iron deficiency anemia Continue iron replacement  therapy  Coronary artery disease status post CABG Continue aspirin and statin therapy  OSA uses CPAP at night -Continue CPAP at night  Spinal stenosis with lumbar and thoracic spines fusion Continue Tylenol   Advance Care Planning:   Code Status: Prior DNI DNR  Consults: None  Family Communication: Plan of care discussed with patient's son present at bedside  Severity of Illness: The appropriate patient status for this patient is INPATIENT. Inpatient status is judged to be reasonable and necessary in order to provide the required intensity of service to ensure the patient's safety. The patient's presenting symptoms, physical exam findings, and initial radiographic and laboratory data in the context of their chronic comorbidities is felt to place them at high risk for further clinical deterioration. Furthermore, it is not anticipated that the patient will be medically stable for discharge from the hospital within 2 midnights of admission.   * I certify that at the point of admission it is my clinical judgment that the patient will require inpatient hospital care spanning beyond 2 midnights from the point of admission due to high intensity of service, high risk for further deterioration and high frequency of surveillance required.*  Author: Loyce Dys, MD 11/20/2022 11:46 AM  For on call review [email or web address].

## 2022-11-20 NOTE — ED Notes (Signed)
Date and time results received: 11/20/22 0927  Test: Lactic Acid Critical Value: 3.0  Name of Provider Notified: Erma Heritage, MD

## 2022-11-20 NOTE — ED Triage Notes (Signed)
Patient to ED via [facility] from home. Patient fell in driveway around 1830 yesterday. Patient usually uses a walker to get around but was not using his walker when walking down the driveway yesterday. Patient fell on his back and is now complaining of lower back pain. Patient has Hx of 11 back surgeries. Patient denies LOC. Patient states he currently takes blood thinners. Patient's friend found patient in driveway this AM and noted that the patient was cold and had vomited. Patient's friend called [facility] for further evaluation.

## 2022-11-20 NOTE — ED Notes (Addendum)
Rn went to CT with pt due to back problems. Pt back from CT, pt head elevated back up. Family at bedside

## 2022-11-20 NOTE — ED Notes (Signed)
Pt's family reported to this RN that glucose monitor is reading in the 300's. Messaged MD at this time.

## 2022-11-20 NOTE — ED Notes (Signed)
Pt family member wanted it noted that pt is unable to lie flat. Due to a neck problem, pt will pass out if he lies flat.

## 2022-11-20 NOTE — ED Notes (Signed)
Patient had small episode of emesis. MD Isaacs notified. See orders.

## 2022-11-21 DIAGNOSIS — R0602 Shortness of breath: Secondary | ICD-10-CM | POA: Diagnosis not present

## 2022-11-21 LAB — GLUCOSE, CAPILLARY
Glucose-Capillary: 308 mg/dL — ABNORMAL HIGH (ref 70–99)
Glucose-Capillary: 149 mg/dL — ABNORMAL HIGH (ref 70–99)
Glucose-Capillary: 215 mg/dL — ABNORMAL HIGH (ref 70–99)
Glucose-Capillary: 192 mg/dL — ABNORMAL HIGH (ref 70–99)
Glucose-Capillary: 193 mg/dL — ABNORMAL HIGH (ref 70–99)

## 2022-11-21 LAB — BASIC METABOLIC PANEL
Anion gap: 15 (ref 5–15)
BUN: 32 mg/dL — ABNORMAL HIGH (ref 8–23)
CO2: 23 mmol/L (ref 22–32)
Calcium: 8.3 mg/dL — ABNORMAL LOW (ref 8.9–10.3)
Chloride: 100 mmol/L (ref 98–111)
Creatinine, Ser: 1.87 mg/dL — ABNORMAL HIGH (ref 0.61–1.24)
GFR, Estimated: 36 mL/min — ABNORMAL LOW (ref >60)
Glucose, Bld: 178 mg/dL — ABNORMAL HIGH (ref 70–99)
Potassium: 4.1 mmol/L (ref 3.5–5.1)
Sodium: 138 mmol/L (ref 135–145)

## 2022-11-21 LAB — CBC
HCT: 40.1 % (ref 39.0–52.0)
Hemoglobin: 13.2 g/dL (ref 13.0–17.0)
MCH: 31.7 pg (ref 26.0–34.0)
MCHC: 32.9 g/dL (ref 30.0–36.0)
MCV: 96.2 fL (ref 80.0–100.0)
Platelets: 200 10*3/uL (ref 150–400)
RBC: 4.17 MIL/uL — ABNORMAL LOW (ref 4.22–5.81)
RDW: 14.1 % (ref 11.5–15.5)
WBC: 12.9 10*3/uL — ABNORMAL HIGH (ref 4.0–10.5)
nRBC: 0 % (ref 0.0–0.2)

## 2022-11-21 MED ORDER — SODIUM CHLORIDE 0.9 % IV SOLN: INTRAVENOUS | Status: AC

## 2022-11-21 MED ORDER — ESCITALOPRAM OXALATE 10 MG PO TABS
15.0000 mg | ORAL_TABLET | Freq: Every day | ORAL | Status: DC
  Administered 2022-11-21 – 2022-11-23 (×3): 15 mg via ORAL
  Filled 2022-11-21 (×3): qty 2

## 2022-11-21 NOTE — Progress Notes (Signed)
Progress Note   Patient: Derek Andersen:[id number] DOB: 1940/09/08 DOA: 11/20/2022     1 DOS: the patient was seen and examined on 11/21/2022   Subjective:  Patient seen and examined at bedside this morning Denies nausea vomiting abdominal pain chest pain cough Has been weaned off oxygen today  Brief hospital course: HPI: Derek Andersen is a 82 y.o. male with medical history significant of atrial fibrillation, coronary artery disease status post CABG, BPH, hypertension, hyperlipidemia, diabetes mellitus, and others as listed below who was otherwise well until 6:30 PM yesterday when he was walking with his walker in his driveway and apparently lost balance falling and hitting the side of his body according to patient he could not get up and he laid throughout the night until this morning when patient was able to get help when the son came by to check up on him and subsequently brought into the emergency room for further management.  Patient denies any chest pain, headache, nausea vomiting abdominal pain or cough.  According to patient he did not loose consciousness.   Assessment and Plan:  Sepsis secondary to hypoxia secondary to community-acquired pneumonia Patient met SIRS criteria in the setting of WBC 20, pulse of 99 with CT scan showing findings of pneumonia, lactic acid 3.0 CT scan of the chest results showed findings of left lung consolidation We will cover with ceftriaxone and azithromycin Continue supplemental oxygen to maintain appropriate saturation and wean off as tolerated Follow-up on culture results   Thyroid nodule CT scan of the head did not show any acute intracranial pathology however it showed 3.2 cm hypodense left thyroid gland nodule with recommendation to have a thyroid ultrasound. Outpatient thyroid ultrasound   Ambulatory dysfunction PT OT consulted Transition of care manager consulted   Chronic kidney disease stage IV Creatinine currently at  baseline Continue monitoring renal function closely   Chronic persistent atrial fibrillation Patient on dabigatran Continue to monitor rate closely Placed on telemetry   GERD-continue pantoprazole   Hyperlipidemia-continue statin therapy   Essential hypertension Continue to monitor blood pressure closely Continue home dose of amlodipine   Diabetes mellitus type II  Continue carb controlled diet Continue current insulin regimen Monitor glucose level closely    Iron deficiency anemia Continue iron replacement therapy   History of anxiety/depression Continue Lexapro  Coronary artery disease status post CABG Continue aspirin and statin therapy   OSA uses CPAP at night Continue CPAP at night   Spinal stenosis with lumbar and thoracic spines fusion Continue Tylenol    Advance Care Planning:   Code Status: Prior DNI DNR   Consults: None   Family Communication: Plan of care discussed with patient's son present at bedside      Physical Exam:  General:Elderly male seen laying in bed  CVS: Irregular irregular no murmur appreciated Respiratory: Decreased air entry especially at the bases Abdomen: Obese moves with respiration Musculoskeletal: Moving all 4 extremities however slightly weaker on the left lower extremity   Data Reviewed: I have reviewed patient's CT scan of the chest results showing findings of left lower lung consolidation as well as CT scan noted.  Result data reviewed no acute intracranial pathology however it showed 3.2 cm hypodense left thyroid nodule. I have also reviewed patient's CBC as well as CMP results as shown below     Latest Ref Rng & Units 11/21/2022    4:15 AM 11/20/2022    8:28 AM 07/07/2019    4:47 AM  CBC  WBC 4.0 - 10.5 K/uL 12.9  20.0  8.8   Hemoglobin 13.0 - 17.0 g/dL 16.1  09.6  04.5   Hematocrit 39.0 - 52.0 % 40.1  47.6  38.5   Platelets 150 - 400 K/uL 200  260  318        Latest Ref Rng & Units 11/21/2022    4:15 AM  11/20/2022    8:28 AM 07/07/2019    4:47 AM  BMP  Glucose 70 - 99 mg/dL 409  811  914   BUN 8 - 23 mg/dL 32  23  31   Creatinine 0.61 - 1.24 mg/dL 7.82  9.56  2.13   Sodium 135 - 145 mmol/L 138  139  140   Potassium 3.5 - 5.1 mmol/L 4.1  4.1  3.0   Chloride 98 - 111 mmol/L 100  102  105   CO2 22 - 32 mmol/L 23  22  27    Calcium 8.9 - 10.3 mg/dL 8.3  8.7  8.9     Family Communication: Plan of care discussed with patient's son  Disposition: Status is: Inpatient Remains inpatient appropriate because: Patient continues to require IV antibiotics as well as workup of sepsis   Time spent: 55 minutes spent taking care of this patient as well as discussing goals of care with patient as well as family  Author: Loyce Dys, MD 11/21/2022 11:29 AM  For on call review [email or web address].

## 2022-11-21 NOTE — Plan of Care (Signed)
  Problem: Coping: Goal: Ability to adjust to condition or change in health will improve Outcome: Progressing   Problem: Fluid Volume: Goal: Ability to maintain a balanced intake and output will improve Outcome: Progressing   

## 2022-11-22 DIAGNOSIS — R0602 Shortness of breath: Secondary | ICD-10-CM | POA: Diagnosis not present

## 2022-11-22 LAB — HEMOGLOBIN A1C
Hgb A1c MFr Bld: 6 % — ABNORMAL HIGH (ref 4.8–5.6)
Mean Plasma Glucose: 126 mg/dL

## 2022-11-22 LAB — GLUCOSE, CAPILLARY
Glucose-Capillary: 155 mg/dL — ABNORMAL HIGH (ref 70–99)
Glucose-Capillary: 175 mg/dL — ABNORMAL HIGH (ref 70–99)
Glucose-Capillary: 215 mg/dL — ABNORMAL HIGH (ref 70–99)
Glucose-Capillary: 206 mg/dL — ABNORMAL HIGH (ref 70–99)

## 2022-11-22 MED ORDER — AZITHROMYCIN 250 MG PO TABS
500.0000 mg | ORAL_TABLET | Freq: Every day | ORAL | Status: DC
  Administered 2022-11-22 – 2022-11-23 (×2): 500 mg via ORAL
  Filled 2022-11-22 (×2): qty 2

## 2022-11-22 NOTE — Care Management Important Message (Signed)
Important Message  Patient Details  Name: Derek Andersen MRN: [id number] Date of Birth: October 20, 1940   Medicare Important Message Given:  Yes  Patient requested information on medical alerts while in room delivering Medicare IM.  Copies of brochures left with patient to reference.    Johnell Comings 11/22/2022, 12:48 PM

## 2022-11-22 NOTE — Progress Notes (Signed)
Progress Note   Patient: Derek Andersen:[id number] DOB: [birth year] DOA: 11/20/2022     2 DOS: the patient was seen and examined on 11/22/2022     Subjective:  Patient seen and examined at bedside this morning in the presence of his caretaker He has not been able to walk around the unit only able to get to the edge of the bed PT OT working with patient and recommending possible rehab Patient is however planning to go home with home health Denies nausea vomiting chest pain or cough   Brief hospital course: HPI: Derek Andersen is a 82 y.o. male with medical history significant of atrial fibrillation, coronary artery disease status post CABG, BPH, hypertension, hyperlipidemia, diabetes mellitus, and others as listed below who was otherwise well until 6:30 PM yesterday when he was walking with his walker in his driveway and apparently lost balance falling and hitting the side of his body according to patient he could not get up and he laid throughout the night until this morning when patient was able to get help when the son came by to check up on him and subsequently brought into the emergency room for further management.  Patient denies any chest pain, headache, nausea vomiting abdominal pain or cough.  According to patient he did not loose consciousness.    Assessment and Plan:   Sepsis secondary to hypoxia secondary to community-acquired pneumonia Patient met SIRS criteria in the setting of WBC 20, pulse of 99 with CT scan showing findings of pneumonia, lactic acid 3.0 CT scan of the chest results showed findings of left lung consolidation Continue ceftriaxone and azithromycin Continue supplemental oxygen to maintain appropriate saturation and wean off as tolerated Follow-up on culture results   Thyroid nodule CT scan of the head did not show any acute intracranial pathology however it showed 3.2 cm hypodense left thyroid gland nodule with recommendation to have a thyroid  ultrasound. Outpatient thyroid ultrasound   Ambulatory dysfunction Continue physical therapy and Occupational Therapy Transition of care manager consulted   Chronic kidney disease stage IV Creatinine currently at baseline Continue monitoring renal function closely   Chronic persistent atrial fibrillation Continue dabigatran Continue to monitor rate closely Placed on telemetry   GERD-continue pantoprazole   Hyperlipidemia-continue statin therapy   Essential hypertension Continue to monitor blood pressure closely Continue home dose of amlodipine   Diabetes mellitus type II  Continue carb controlled diet Continue current insulin regimen Monitor glucose level closely     Iron deficiency anemia Continue iron replacement therapy   History of anxiety/depression Continue Lexapro   Coronary artery disease status post CABG Continue aspirin and statin therapy   OSA uses CPAP at night Continue CPAP at night   Spinal stenosis with lumbar and thoracic spines fusion Continue Tylenol    Advance Care Planning:   Code Status: Prior DNI DNR   Consults: None   Family Communication: Plan of care discussed with patient's son present at bedside     Physical Exam:   General:Elderly male laying in bed in no acute distress CVS: Irregular irregular no murmur appreciated Respiratory: Decreased air entry especially at the bases Abdomen: Obese moves with respiration Musculoskeletal: Moving all 4 extremities however slightly weaker on the left lower extremity     Data Reviewed: I have reviewed patient's CBC, CMP as well as PT OT documentation and transition of care manager's documentation    Family Communication: Plan of care discussed with patient's son   Disposition: Status is: Inpatient  Remains inpatient appropriate because: Patient continues to require IV antibiotics as well as workup of sepsis       Latest Ref Rng & Units 11/21/2022    4:15 AM 11/20/2022    8:28 AM  07/07/2019    4:47 AM  CBC  WBC 4.0 - 10.5 K/uL 12.9  20.0  8.8   Hemoglobin 13.0 - 17.0 g/dL 16.1  09.6  04.5   Hematocrit 39.0 - 52.0 % 40.1  47.6  38.5   Platelets 150 - 400 K/uL 200  260  318        Latest Ref Rng & Units 11/21/2022    4:15 AM 11/20/2022    8:28 AM 07/07/2019    4:47 AM  BMP  Glucose 70 - 99 mg/dL 409  811  914   BUN 8 - 23 mg/dL 32  23  31   Creatinine 0.61 - 1.24 mg/dL 7.82  9.56  2.13   Sodium 135 - 145 mmol/L 138  139  140   Potassium 3.5 - 5.1 mmol/L 4.1  4.1  3.0   Chloride 98 - 111 mmol/L 100  102  105   CO2 22 - 32 mmol/L 23  22  27    Calcium 8.9 - 10.3 mg/dL 8.3  8.7  8.9     Vitals:   11/21/22 1912 11/22/22 0527 11/22/22 0807 11/22/22 1520  BP: (!) 157/64 (!) 160/72 (!) 169/66 (!) 165/61  Pulse: 80 75 83 80  Resp: 20 20 20 20   Temp: 99.3 F (37.4 C) 98.7 F (37.1 C) 98.2 F (36.8 C) 97.8 F (36.6 C)  TempSrc: Oral Oral  Oral  SpO2: 92% 96% 97% 94%  Weight:      Height:         Author: Loyce Dys, MD 11/22/2022 5:09 PM  For on call review [email or web address].

## 2022-11-22 NOTE — Evaluation (Addendum)
Physical Therapy Evaluation Patient Details Name: Derek Andersen MRN: [id number] DOB: Nov 24, 1940 Today's Date: 11/22/2022  History of Present Illness  Derek Andersen is a 82 y.o. male with medical history significant of atrial fibrillation, coronary artery disease status post CABG, BPH, hypertension, hyperlipidemia, diabetes mellitus, and others as listed below who was otherwise well until 6:30 PM yesterday when he was walking with his walker in his driveway and apparently lost balance falling and hitting the side of his body according to patient he could not get up and he laid throughout the night until this morning when patient was able to get help when the son came by to check up on him and subsequently brought into the emergency room for further management.  Patient denies any chest pain, headache, nausea vomiting abdominal pain or cough.  According to patient he did not lose consciousness.    Clinical Impression  Pt admitted with above diagnosis. Pt previously living in single level home with ramped entry. Ambulating inside and outside of the home w/ rollator. 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 Pt has a caregiver that comes to his home everyday, along with a son and sister in law close-by available to check on him as needed.  Pt ModA for bed mobility to assume EOB positioning noting decreased LUE ROM to assist in log roll. Pt able to maintain EOB static sitting balance w/ 1UE support. Pt requiring bilat UE support along w/ v/c's to assume standing positioning CGA w/ RW. Pt able to maintain static standing balance along with lateral weight shifts in marching w/ bilat UE support from RW. Pt ambulated 63ft within room, w/ RW CGA noting increased bilat UE Wb'ing, decreased cadence and decreased bilat ankle DF. Pt educated on benefits for [facility] PT versus OPPT. If pt were to refuse current OPPT recommendation, pt would benefit from  HHPT services to address balance, strength, and functional mobility deficits. Pt returned to chair at bedside w/ all needs met. Pt currently with functional limitations due to the deficits listed below (see PT Problem List). Pt will benefit from skilled PT to increase their independence and safety with mobility to allow discharge to the next venue of care.         If plan is discharge home, recommend the following: A little help with walking and/or transfers;Assist for transportation;Help with stairs or ramp for entrance   Can travel by private vehicle        Equipment Recommendations    Recommendations for Other Services  Rehab consult    Functional Status Assessment Patient has had a recent decline in their functional status and demonstrates the ability to make significant improvements in function in a reasonable and predictable amount of time.     Precautions / Restrictions Precautions Precautions: Fall Restrictions Weight Bearing Restrictions: No      Mobility  Bed Mobility                    Transfers                        Ambulation/Gait                  Stairs            Wheelchair Mobility     Tilt Bed    Modified Rankin (Stroke Patients Only)  Balance                                             Pertinent Vitals/Pain      Home Living Family/patient expects to be discharged to:: Private residence Living Arrangements: Alone Available Help at Discharge: Personal care attendant;Family;Neighbor Type of Home: House Home Access: Ramped entrance       Home Layout: One level Home Equipment: Agricultural consultant (2 wheels);Rollator (4 wheels);BSC/3in1 Additional Comments: Caregiver everyday 9am- 1pm. Pt's son available at 6pm after work. Neighbor is pt's sister in law available to assist pt.    Prior Function Prior Level of Function : Needs assist       Physical Assist : ADLs (physical)   ADLs  (physical): IADLs Mobility Comments: Pt able to ambulate w/ rollator inside and outside of the home. Sleeps in a lift chair and has a urinal "jug" to urinate at night. ADLs Comments: Caregiver assists with ADL's inside of the home.     Extremity/Trunk Assessment   Upper Extremity Assessment Upper Extremity Assessment: LUE deficits/detail LUE Deficits / Details: L shoulder flexion/ protraction limited during bed mobility. LUE: Shoulder pain with ROM    Lower Extremity Assessment Lower Extremity Assessment: Generalized weakness;RLE deficits/detail;LLE deficits/detail RLE Deficits / Details: No trace ankle DF noted LLE Deficits / Details: No trace ankle DF noted       Communication   Communication Communication: No apparent difficulties  Cognition                                                General Comments General comments (skin integrity, edema, etc.): Lateral weight shifts in standing x5 LLE/RLE w/ bilat UE support on RW. Standing marches x5 LLE/ RLE w/ bilat UE support on RW.    Exercises     Assessment/Plan    PT Assessment Patient needs continued PT services  PT Problem List Decreased strength;Decreased range of motion;Decreased activity tolerance;Decreased mobility;Pain       PT Treatment Interventions Gait training;Functional mobility training;Therapeutic activities;Therapeutic exercise;Balance training;Neuromuscular re-education    PT Goals (Current goals can be found in the Care Plan section)  Acute Rehab PT Goals Patient Stated Goal: to return home PT Goal Formulation: With patient Time For Goal Achievement: 12/06/22 Potential to Achieve Goals: Good    Frequency Min 1X/week     Co-evaluation               AM-PAC PT "6 Clicks" Mobility  Outcome Measure Help needed turning from your back to your side while in a flat bed without using bedrails?: A Lot Help needed moving from lying on your back to sitting on the side of a flat  bed without using bedrails?: A Lot Help needed moving to and from a bed to a chair (including a wheelchair)?: A Little Help needed standing up from a chair using your arms (e.g., wheelchair or bedside chair)?: None Help needed to walk in hospital room?: A Little Help needed climbing 3-5 steps with a railing? : A Lot 6 Click Score: 16    End of Session Equipment Utilized During Treatment: Gait belt Activity Tolerance: Patient tolerated treatment well;Patient limited by pain Patient left: in chair;with call bell/phone within reach;with chair alarm set  PT Visit Diagnosis: Unsteadiness on feet (R26.81);Repeated falls (R29.6);Muscle weakness (generalized) (M62.81);History of falling (Z91.81);Pain Pain - Right/Left: Left Pain - part of body:  (Back)    Time: 4098-1191 PT Time Calculation (min) (ACUTE ONLY): 27 min   Charges:   PT Evaluation $PT Eval Low Complexity: 1 Low PT Treatments $Therapeutic Activity: 8-22 mins PT General Charges $$ ACUTE PT VISIT: 1 Visit        Lovie Macadamia, SPT  Delphia Grates. Fairly IV, PT, DPT Physical Therapist- [place]  [facility]

## 2022-11-22 NOTE — Plan of Care (Signed)
Problem: Coping: Goal: Ability to adjust to condition or change in health will improve Outcome: Progressing   Problem: Fluid Volume: Goal: Ability to maintain a balanced intake and output will improve Outcome: Progressing

## 2022-11-22 NOTE — Plan of Care (Signed)

## 2022-11-22 NOTE — Progress Notes (Signed)
PHARMACIST - PHYSICIAN COMMUNICATION DR:   Meriam Sprague CONCERNING: Antibiotic IV to Oral Route Change Policy  RECOMMENDATION: This patient is receiving azithromycin by the intravenous route.  Based on criteria approved by the Pharmacy and Therapeutics Committee, the antibiotic(s) is/are being converted to the equivalent oral dose form(s).   DESCRIPTION: These criteria include: Patient being treated for a respiratory tract infection, urinary tract infection, cellulitis or clostridium difficile associated diarrhea if on metronidazole The patient is not neutropenic and does not exhibit a GI malabsorption state The patient is eating (either orally or via tube) and/or has been taking other orally administered medications for a least 24 hours The patient is improving clinically and has a Tmax < 100.5  If you have questions about this conversion, please contact the Pharmacy Department   Barrie Folk, PharmD Clinical Pharmacist

## 2022-11-23 DIAGNOSIS — R0602 Shortness of breath: Secondary | ICD-10-CM | POA: Diagnosis not present

## 2022-11-23 LAB — GLUCOSE, CAPILLARY
Glucose-Capillary: 268 mg/dL — ABNORMAL HIGH (ref 70–99)
Glucose-Capillary: 297 mg/dL — ABNORMAL HIGH (ref 70–99)

## 2022-11-23 LAB — BASIC METABOLIC PANEL
Anion gap: 11 (ref 5–15)
BUN: 22 mg/dL (ref 8–23)
CO2: 26 mmol/L (ref 22–32)
Calcium: 8.7 mg/dL — ABNORMAL LOW (ref 8.9–10.3)
Chloride: 101 mmol/L (ref 98–111)
Creatinine, Ser: 1.32 mg/dL — ABNORMAL HIGH (ref 0.61–1.24)
GFR, Estimated: 55 mL/min — ABNORMAL LOW (ref >60)
Glucose, Bld: 301 mg/dL — ABNORMAL HIGH (ref 70–99)
Potassium: 3.9 mmol/L (ref 3.5–5.1)
Sodium: 138 mmol/L (ref 135–145)

## 2022-11-23 MED ORDER — HYDRALAZINE HCL 50 MG PO TABS: 50.0000 mg | ORAL_TABLET | Freq: Three times a day (TID) | ORAL | 0 refills | Status: DC

## 2022-11-23 MED ORDER — AMLODIPINE BESYLATE 5 MG PO TABS: 10.0000 mg | ORAL_TABLET | Freq: Every day | ORAL | 0 refills | Status: DC

## 2022-11-23 MED ORDER — HYDRALAZINE HCL 50 MG PO TABS
50.0000 mg | ORAL_TABLET | Freq: Three times a day (TID) | ORAL | Status: DC
  Administered 2022-11-23: 50 mg via ORAL
  Filled 2022-11-23: qty 1

## 2022-11-23 MED ORDER — AMOXICILLIN-POT CLAVULANATE 875-125 MG PO TABS: 1.0000 | ORAL_TABLET | Freq: Two times a day (BID) | ORAL | 0 refills | Status: DC

## 2022-11-23 MED ORDER — AMOXICILLIN-POT CLAVULANATE 875-125 MG PO TABS: 1.0000 | ORAL_TABLET | Freq: Two times a day (BID) | ORAL | Status: DC

## 2022-11-23 NOTE — Discharge Summary (Signed)
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 male with medical history significant of atrial fibrillation, coronary artery disease status post CABG, BPH, hypertension, hyperlipidemia, diabetes mellitus, and others as listed below who was otherwise well until 6:30 PM yesterday when he was walking with his walker in his driveway and apparently lost balance falling and hitting the side of his body according to patient he could not get up and he laid throughout the night until this morning when patient was able to get help when the son came by to check up on him and subsequently brought into the emergency room for further management.  Patient denies any chest pain, headache, nausea vomiting abdominal pain or cough.  According to patient he did not loose consciousness.  Imaging of the lungs showed findings of infiltrate concerning for pneumonia.  Patient received IV antibiotics with improvement in respiratory function.  Was able to walk about with physical therapist who have recommended discharge home with home health versus rehab however patient chose to go home.  I have also informed patient concerning follow-up with his primary care physician concerning the thyroid nodule found  on his CT scan report.  Assessment and Plan:   Consultants: PT OT Procedures performed: None Disposition: Home health Diet recommendation:  Discharge Diet Orders (From admission, onward)     Start     Ordered   11/23/22 0000  Diet - low sodium heart healthy        11/23/22 1147           Cardiac diet DISCHARGE MEDICATION: Allergies as of 11/23/2022       Reactions   Oxycodone    Patient states "you don't know where you are at, and it screws you up. It also gives crazy dreams."   Latex         Medication List     STOP taking these medications    benazepril 40 MG tablet Commonly known as: LOTENSIN   furosemide 20 MG tablet Commonly known as: LASIX       TAKE these medications    acetaminophen 325 MG tablet Commonly known as: TYLENOL Take 650 mg by mouth every 6 (six) hours as needed for headache.   amLODipine 5 MG tablet Commonly known as: NORVASC Take 2 tablets (10 mg total) by mouth daily. What changed: how much to take   amoxicillin-clavulanate 875-125 MG tablet Commonly known as: AUGMENTIN Take 1 tablet by mouth 2 (two) times daily.   aspirin EC 81 MG tablet Take 81 mg by mouth daily.   Basaglar KwikPen 100 UNIT/ML Inject 35 Units into the skin daily.   brimonidine 0.2 % ophthalmic solution Commonly known as: ALPHAGAN Place 1 drop into both eyes 3 (three) times daily.   buPROPion 300 MG 24 hr tablet Commonly known as: WELLBUTRIN XL Take 300 mg by mouth daily.   calcium  carbonate 750 MG chewable tablet Commonly known as: TUMS EX Chew 750 mg by mouth in the morning and at bedtime.   Cholecalciferol 25 MCG (1000 UT) capsule Take 1 tablet by mouth daily.   cyanocobalamin 1000 MCG tablet Commonly known as: VITAMIN B12 Take 1,000 mcg by mouth daily.   dabigatran 75 MG Caps capsule Commonly known as: PRADAXA Take 75 mg by mouth 2 (two) times daily.   diclofenac Sodium 1 % Gel Commonly known as: VOLTAREN Apply 2 g topically in the  morning and at bedtime.   Dulaglutide 4.5 MG/0.5ML Sopn Inject 4.5 mg into the skin every Monday.   escitalopram 10 MG tablet Commonly known as: LEXAPRO Take 15 mg by mouth daily.   ferrous sulfate 325 (65 FE) MG tablet Take 325 mg by mouth daily.   finasteride 5 MG tablet Commonly known as: PROSCAR Take 5 mg by mouth daily.   Fish Oil 1200 MG Caps Take 2,400 mg by mouth in the morning and at bedtime.   fluticasone 50 MCG/ACT nasal spray Commonly known as: FLONASE Place 2 sprays into both nostrils daily.   hydrALAZINE 50 MG tablet Commonly known as: APRESOLINE Take 1 tablet (50 mg total) by mouth 3 (three) times daily.   hydrocortisone 2.5 % cream Apply 1 Application topically 2 (two) times daily as needed (facial rash).   insulin aspart 100 UNIT/ML injection Commonly known as: novoLOG Inject 0-50 Units into the skin 3 (three) times daily before meals.   Jardiance 25 MG Tabs tablet Generic drug: empagliflozin Take 25 mg by mouth daily.   ketoconazole 2 % shampoo Commonly known as: NIZORAL Apply 1 Application topically 3 (three) times a week.   magnesium oxide 400 MG tablet Commonly known as: MAG-OX Take 400 mg by mouth daily.   multivitamin with minerals Tabs tablet Take 1 tablet by mouth daily.   pantoprazole 40 MG tablet Commonly known as: PROTONIX Take 40 mg by mouth daily.   pravastatin 40 MG tablet Commonly known as: PRAVACHOL Take 40 mg by mouth daily.   tamsulosin 0.4 MG Caps capsule Commonly known as: FLOMAX Take 0.4 mg by mouth at bedtime.        Discharge Exam: Filed Weights   11/20/22 0823  Weight: 103.9 kg   General:Elderly male laying in bed in no acute distress CVS: Irregular irregular no murmur appreciated Respiratory: Decreased air entry especially at the bases Abdomen: Obese moves with respiration Musculoskeletal: Moving all 4 extremities however slightly weaker on the left lower extremity  Condition at discharge:  good    Discharge time spent:  34 minutes.  Signed: Loyce Dys, MD [facility] 11/23/2022

## 2022-11-23 NOTE — TOC Initial Note (Signed)
Transition of Care [facility]) - Initial/Assessment Note    Patient Details  Name: Derek Andersen MRN: [id number] Date of Birth: [birth year]  Transition of [facility]) CM/SW Contact:    Liliana Cline, LCSW Phone Number: 11/23/2022, 11:45 AM  Clinical Narrative:                 Met with patient at bedside. Patient is from home alone. Has a caregiver in the home daily. Son is also involved and supportive. PCP is Dr. Larwance Sachs. Patient has a RW, rollator, and 3in1 at home. CSW explained rec for OPPT. Patient states he does not want OPPT, he would like HH. Referral accepted by Elnita Maxwell with [facility]. Asked MD for [facility] Orders.   Expected Discharge Plan: Home w Home Health Services Barriers to Discharge: Continued Medical Work up   Patient Goals and CMS Choice Patient states their goals for this hospitalization and ongoing recovery are:: wants [facility] CMS Medicare.gov Compare Post Acute Care list provided to:: Patient Choice offered to / list presented to : Patient      Expected Discharge Plan and Services       Living arrangements for the past 2 months: Single Family Home                           HH Arranged: PT HH Agency: [facility] Date [facility] Agency Contacted: 11/23/22   Representative spoke with at [facility] Agency: Elnita Maxwell  Prior Living Arrangements/Services Living arrangements for the past 2 months: Single Family Home Lives with:: Self Patient language and need for interpreter reviewed:: Yes Do you feel safe going back to the place where you live?: Yes      Need for Family Participation in Patient Care: Yes (Comment) Care giver support system in place?: Yes (comment) Current home services: DME, Homehealth aide Criminal Activity/Legal Involvement Pertinent to Current Situation/Hospitalization: No - Comment as needed  Activities of Daily Living Home Assistive Devices/Equipment: Walker (specify type), Eyeglasses ADL Screening (condition at time of admission) Patient's  cognitive ability adequate to safely complete daily activities?: No Is the patient deaf or have difficulty hearing?: No Does the patient have difficulty seeing, even when wearing glasses/contacts?: No Does the patient have difficulty concentrating, remembering, or making decisions?: Yes Patient able to express need for assistance with ADLs?: Yes Does the patient have difficulty dressing or bathing?: Yes Independently performs ADLs?: No Communication: Independent Dressing (OT): Needs assistance Is this a change from baseline?: Change from baseline, expected to last <3days Grooming: Independent Feeding: Independent Bathing: Needs assistance Is this a change from baseline?: Change from baseline, expected to last <3 days Toileting: Needs assistance Is this a change from baseline?: Change from baseline, expected to last <3 days In/Out Bed: Needs assistance Is this a change from baseline?: Change from baseline, expected to last <3 days Walks in Home: Needs assistance Is this a change from baseline?: Change from baseline, expected to last <3 days Does the patient have difficulty walking or climbing stairs?: Yes Weakness of Legs: Both Weakness of Arms/Hands: None  Permission Sought/Granted Permission sought to share information with : Facility Industrial/product designer granted to share information with : Yes, Verbal Permission Granted     Permission granted to share info w AGENCY: HH        Emotional Assessment       Orientation: : Oriented to Situation, Oriented to Self, Oriented to Place, Oriented to  Time Alcohol / Substance Use: Not  Applicable Psych Involvement: No (comment)  Admission diagnosis:  SOB (shortness of breath) [R06.02] Patient Active Problem List   Diagnosis Date Noted   SOB (shortness of breath) 11/20/2022   C. difficile colitis    Symptomatic anemia 06/29/2019   Acute gastroenteritis: C diff and Salmonella positive 06/29/2019    Suspect GI bleed  06/29/2019   Acute renal failure superimposed on stage 4 chronic kidney disease (HCC) 06/29/2019   Chronic anticoagulation 06/29/2019   Atrial fibrillation, chronic (HCC) 06/29/2019   CAD (coronary artery disease) 06/29/2019   Type 2 diabetes mellitus with hyperlipidemia (HCC) 06/29/2019   Clostridium difficile enteritis 06/29/2019   Intestinal infection or food poisoning due to salmonella bacteria 06/29/2019   Infectious colitis 06/29/2019   PCP:  Kandyce Rud, MD Pharmacy:   [facility] [phone] [place], [place] - [address] [place] [address] Phone: [phone] Fax: [phone]     Social Determinants of Health (SDOH) Social History: SDOH Screenings   Food Insecurity: Patient Unable To Answer (11/20/2022)  Housing: Patient Unable To Answer (11/20/2022)  Transportation Needs: Patient Unable To Answer (11/20/2022)  Utilities: Patient Unable To Answer (11/20/2022)  Financial Resource Strain: Patient Declined (11/04/2022)   Received from [facility]  Tobacco Use: Low Risk  (11/20/2022)  Recent Concern: Tobacco Use - Medium Risk (11/04/2022)   Received from [facility]   SDOH Interventions:     Readmission Risk Interventions     No data to display

## 2022-11-23 NOTE — Progress Notes (Signed)
Informed Hospitalist re: BP 174/74, MAP 103, HR 67. No orders received. Will continue to monitor.

## 2022-11-23 NOTE — Progress Notes (Signed)
Physical Therapy Treatment Patient Details Name: Derek Andersen MRN: [id number] DOB: 1940-07-30 Today's Date: 11/23/2022   History of Present Illness Derek Andersen is a 82 y.o. male with medical history significant of atrial fibrillation, coronary artery disease status post CABG, BPH, hypertension, hyperlipidemia, diabetes mellitus, and others as listed below who was otherwise well until 6:30 PM yesterday when he was walking with his walker in his driveway and apparently lost balance falling and hitting the side of his body according to patient he could not get up and he laid throughout the night until this morning when patient was able to get help when the son came by to check up on him and subsequently brought into the emergency room for further management.  Patient denies any chest pain, headache, nausea vomiting abdominal pain or cough.  According to patient he did not lose consciousness.    PT Comments  Pt was long sitting in bed upon arrival. Supportive friend visiting.  He is requesting to get OOB to BR. Pt was able to exit L side of bed, stand and ambulate to BR. Has successful, loose, BM prior to ambulating to recliner. Pt overall tolerated session well. He does not ambulate great distances at baseline and sleep in a recliner/lift chair. Pt feels safe to return home at DC.    If plan is discharge home, recommend the following: A little help with walking and/or transfers;Assist for transportation;Help with stairs or ramp for entrance     Equipment Recommendations  None recommended by PT (Pt has all equipment needs met)       Precautions / Restrictions Precautions Precautions: Fall Restrictions Weight Bearing Restrictions: No     Mobility  Bed Mobility Overal bed mobility: Needs Assistance Bed Mobility: Rolling, Sidelying to Sit Rolling: Min assist Sidelying to sit: Min assist, Mod assist  General bed mobility comments: pt sleepsin recliner/lift chair at baseline     Transfers Overall transfer level: Needs assistance Equipment used: Rolling walker (2 wheels) Transfers: Sit to/from Stand Sit to Stand: Supervision   Ambulation/Gait Ambulation/Gait assistance: Supervision Gait Distance (Feet): 20 Feet Assistive device: Rolling walker (2 wheels) Gait Pattern/deviations: Decreased step length - left, Decreased step length - right, Narrow base of support, Decreased dorsiflexion - left, Decreased dorsiflexion - right Gait velocity: decreased  General Gait Details: Pt ambulate short distance in room from BR to recliner place on opposite side of room. no LOB. pt does not ambulate far at baseline.    Balance Overall balance assessment: Needs assistance Sitting-balance support: Feet supported Sitting balance-Leahy Scale: Good     Standing balance support: During functional activity, Reliant on assistive device for balance Standing balance-Leahy Scale: Fair Standing balance comment: Bilateral UE support on RW to assist in standing positioning.      Cognition Arousal: Alert Behavior During Therapy: WFL for tasks assessed/performed Overall Cognitive Status: Within Functional Limits for tasks assessed    General Comments: Pt is A and O x 4               Pertinent Vitals/Pain Pain Assessment Pain Assessment: 0-10 Pain Score: 3      PT Goals (current goals can now be found in the care plan section) Acute Rehab PT Goals Patient Stated Goal: to return home Progress towards PT goals: Progressing toward goals    Frequency    Min 1X/week       AM-PAC PT "6 Clicks" Mobility   Outcome Measure  Help needed turning from your back to your  side while in a flat bed without using bedrails?: A Little Help needed moving from lying on your back to sitting on the side of a flat bed without using bedrails?: A Lot Help needed moving to and from a bed to a chair (including a wheelchair)?: A Little Help needed standing up from a chair using your arms  (e.g., wheelchair or bedside chair)?: A Little Help needed to walk in hospital room?: A Little Help needed climbing 3-5 steps with a railing? : A Little 6 Click Score: 17    End of Session   Activity Tolerance: Patient tolerated treatment well Patient left: in chair;with call bell/phone within reach;with chair alarm set Nurse Communication: Mobility status PT Visit Diagnosis: Unsteadiness on feet (R26.81);Repeated falls (R29.6);Muscle weakness (generalized) (M62.81);History of falling (Z91.81);Pain     Time: 1610-9604 PT Time Calculation (min) (ACUTE ONLY): 24 min  Charges:    $Gait Training: 8-22 mins $Therapeutic Activity: 8-22 mins PT General Charges $$ ACUTE PT VISIT: 1 Visit                    Jetta Lout PTA 11/23/22, 9:23 AM

## 2022-11-25 LAB — CULTURE, BLOOD (SINGLE)
Culture: NO GROWTH
Special Requests: ADEQUATE

## 2023-01-17 ENCOUNTER — Emergency Department
Admission: EM | Admit: 2023-01-17 | Discharge: 2023-01-17 | Disposition: A | Discharge status: Home / Self Care | Payer: Medicare HMO | Attending: Emergency Medicine | Admitting: Emergency Medicine

## 2023-01-17 ENCOUNTER — Other Ambulatory Visit: Payer: Self-pay

## 2023-01-17 ENCOUNTER — Emergency Department: Payer: Medicare HMO

## 2023-01-17 DIAGNOSIS — R4182 Altered mental status, unspecified: Secondary | ICD-10-CM | POA: Insufficient documentation

## 2023-01-17 DIAGNOSIS — R7309 Other abnormal glucose: Secondary | ICD-10-CM | POA: Diagnosis not present

## 2023-01-17 DIAGNOSIS — I10 Essential (primary) hypertension: Secondary | ICD-10-CM | POA: Diagnosis not present

## 2023-01-17 LAB — URINALYSIS, ROUTINE W REFLEX MICROSCOPIC
Bacteria, UA: NONE SEEN
Bilirubin Urine: NEGATIVE
Glucose, UA: >=500 mg/dL — AB
Ketones, ur: 5 mg/dL — AB
Leukocytes,Ua: NEGATIVE
Nitrite: NEGATIVE
Protein, ur: 100 mg/dL — AB
Specific Gravity, Urine: 1.021 (ref 1.005–1.030)
Squamous Epithelial / HPF: 0 /[HPF] (ref 0–5)
pH: 6 (ref 5.0–8.0)

## 2023-01-17 LAB — COMPREHENSIVE METABOLIC PANEL
ALT: 19 U/L (ref 0–44)
AST: 22 U/L (ref 15–41)
Albumin: 3.8 g/dL (ref 3.5–5.0)
Alkaline Phosphatase: 125 U/L (ref 38–126)
Anion gap: 11 (ref 5–15)
BUN: 36 mg/dL — ABNORMAL HIGH (ref 8–23)
CO2: 27 mmol/L (ref 22–32)
Calcium: 9.1 mg/dL (ref 8.9–10.3)
Chloride: 100 mmol/L (ref 98–111)
Creatinine, Ser: 1.72 mg/dL — ABNORMAL HIGH (ref 0.61–1.24)
GFR, Estimated: 40 mL/min — ABNORMAL LOW (ref >60)
Glucose, Bld: 150 mg/dL — ABNORMAL HIGH (ref 70–99)
Potassium: 3.8 mmol/L (ref 3.5–5.1)
Sodium: 138 mmol/L (ref 135–145)
Total Bilirubin: 0.7 mg/dL (ref <1.2)
Total Protein: 7.4 g/dL (ref 6.5–8.1)

## 2023-01-17 LAB — CBC
HCT: 45.6 % (ref 39.0–52.0)
Hemoglobin: 15.1 g/dL (ref 13.0–17.0)
MCH: 31.5 pg (ref 26.0–34.0)
MCHC: 33.1 g/dL (ref 30.0–36.0)
MCV: 95.2 fL (ref 80.0–100.0)
Platelets: 222 10*3/uL (ref 150–400)
RBC: 4.79 MIL/uL (ref 4.22–5.81)
RDW: 13.4 % (ref 11.5–15.5)
WBC: 7.4 10*3/uL (ref 4.0–10.5)
nRBC: 0 % (ref 0.0–0.2)

## 2023-01-17 LAB — CBG MONITORING, ED: Glucose-Capillary: 141 mg/dL — ABNORMAL HIGH (ref 70–99)

## 2023-01-17 MED ORDER — AMLODIPINE BESYLATE 5 MG PO TABS
10.0000 mg | ORAL_TABLET | Freq: Once | ORAL | Status: AC
  Administered 2023-01-17: 10 mg via ORAL
  Filled 2023-01-17: qty 2

## 2023-01-17 MED ORDER — HYDRALAZINE HCL 50 MG PO TABS
50.0000 mg | ORAL_TABLET | Freq: Once | ORAL | Status: AC
  Administered 2023-01-17: 50 mg via ORAL
  Filled 2023-01-17: qty 1

## 2023-01-17 NOTE — ED Triage Notes (Addendum)
Pt to ED via [facility] from home. Son reports confusion since last night. Pt doesn't remember son visiting last night. This morning friend came to visit and found him without pants and urinating on himself. Pt reports increased leg weakness and grogginess. Pt caretaker reports given 30 units of insulin this AM. CBG 138. Pt A&Ox4 on arrival and does state caregiver left at 1pm yesterday and doesn't remember much after that. 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   EMS VS:  BP 204/86 RR 23 ET 35 95% RA HR 74 18g LAC

## 2023-01-17 NOTE — Discharge Instructions (Signed)
Please stop using your steroid medication (methylprednisolone) and follow-up with your PCP for continued management of these medications

## 2023-01-17 NOTE — ED Provider Notes (Signed)
[facility] Provider Note   Event Date/Time   First MD Initiated Contact with Patient 01/17/23 1355     (approximate) History  Altered Mental Status  HPI Derek Andersen is a 83 y.o. 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  Patient states that he does not remember a caregiver coming to the house last night.  Patient states that he does remember the caregiver coming in the morning but that is the last thing.  Patient also states that he had an episode of incontinence last night which is out of the ordinary for him. ROS: Patient currently denies any vision changes, tinnitus, difficulty speaking, facial droop, sore throat, chest pain, shortness of breath, nausea/vomiting/diarrhea, or weakness/numbness/paresthesias in any extremity   Physical Exam  Triage Vital Signs: ED Triage Vitals [01/17/23 1134]  Encounter Vitals Group     BP (!) 115/99     Systolic BP Percentile      Diastolic BP Percentile      Pulse Rate 65     Resp 18     Temp 98.4 F (36.9 C)     Temp Source Oral     SpO2 96 %     Weight      Height      Head Circumference      Peak Flow      Pain Score 0     Pain Loc      Pain Education      Exclude from Growth Chart    Most recent vital signs: Vitals:   01/17/23 1445 01/17/23 1455  BP:  (!) 188/89  Pulse: 74 74  Resp:  20  Temp:    SpO2: 96% 96%   General: Awake, oriented x4. CV:  Good peripheral perfusion.  Resp:  Normal effort.  Abd:  No distention.  Other:  Elderly overweight Caucasian male resting comfortably in no acute distress ED Results / Procedures / Treatments  Labs (all labs ordered are listed, but only abnormal results are displayed) Labs Reviewed  COMPREHENSIVE METABOLIC PANEL - Abnormal; Notable for the following  components:      Result Value   Glucose, Bld 150 (*)    BUN 36 (*)    Creatinine, Ser 1.72 (*)    GFR, Estimated 40 (*)    All other components within normal limits  URINALYSIS, ROUTINE W REFLEX MICROSCOPIC - Abnormal; Notable for the following components:   Color, Urine YELLOW (*)    APPearance CLEAR (*)    Glucose, UA >=500 (*)    Hgb urine dipstick SMALL (*)    Ketones, ur 5 (*)    Protein, ur 100 (*)    All other components within normal limits  CBG MONITORING, ED - Abnormal; Notable for the following components:   Glucose-Capillary 141 (*)    All other components within normal limits  CBC   EKG ED ECG REPORT I, Merwyn Katos, the attending physician, personally viewed and interpreted this ECG. Date: 01/17/2023 EKG Time: 1137 Rate: 66 Rhythm: normal sinus rhythm QRS Axis: normal Intervals: normal ST/T Wave abnormalities: normal Narrative Interpretation: no evidence of acute ischemia RADIOLOGY ED MD interpretation: CT of the head without contrast interpreted by me shows no evidence of acute abnormalities including no intracerebral hemorrhage, obvious masses, or significant edema -Agree  with radiology assessment Official radiology report(s): CT Head Wo Contrast  Result Date: 01/17/2023 CLINICAL DATA:  Altered mental status EXAM: CT HEAD WITHOUT CONTRAST TECHNIQUE: Contiguous axial images were obtained from the base of the skull through the vertex without intravenous contrast. RADIATION DOSE REDUCTION: This exam was performed according to the departmental dose-optimization program which includes automated exposure control, adjustment of the mA and/or kV according to patient size and/or use of iterative reconstruction technique. COMPARISON:  CT Head 11/20/22 FINDINGS: Brain: No evidence of acute infarction, hemorrhage, hydrocephalus, extra-axial collection or mass lesion/mass effect. Vascular: No hyperdense vessel or unexpected calcification. Skull: Normal. Negative for fracture  or focal lesion. Sinuses/Orbits: No middle ear or mastoid effusion. Paranasal sinuses are clear. Bilateral lens replacement. Orbits are otherwise unremarkable. Other: None. IMPRESSION: No acute intracranial abnormality. Electronically Signed   By: Lorenza Cambridge M.D.   On: 01/17/2023 12:59   PROCEDURES: Critical Care performed: No .1-3 Lead EKG Interpretation  Performed by: Merwyn Katos, MD Authorized by: Merwyn Katos, MD     Interpretation: normal     ECG rate:  71   ECG rate assessment: normal     Rhythm: sinus rhythm     Ectopy: none     Conduction: normal    MEDICATIONS ORDERED IN ED: Medications  amLODipine (NORVASC) tablet 10 mg (10 mg Oral Given 01/17/23 1433)  hydrALAZINE (APRESOLINE) tablet 50 mg (50 mg Oral Given 01/17/23 1434)   IMPRESSION / MDM / ASSESSMENT AND PLAN / ED COURSE  I reviewed the triage vital signs and the nursing notes.                             The patient is on the cardiac monitor to evaluate for evidence of arrhythmia and/or significant heart rate changes. Patient's presentation is most consistent with acute presentation with potential threat to life or bodily function. The patient suffered an episode of altered mental status, but there is no overt concern for a dangerous emergent cause such as, but not limited to, CNS infection, severe Toxidrome, severe metabolic derangement, or stroke.  Given History, Physical, and Workup the cause appears to be possible medication side effect regarding steroids  Disposition: Discharge. At the time of discharge, the patient is back to baseline mental status.   FINAL CLINICAL IMPRESSION(S) / ED DIAGNOSES   Final diagnoses:  Altered mental status, unspecified altered mental status type   Rx / DC Orders   ED Discharge Orders     None      Note:  This document was prepared using Dragon voice recognition software and may include unintentional dictation errors.   Merwyn Katos, MD 01/17/23 [phone]

## 2023-01-17 NOTE — ED Notes (Signed)
Pt. States he was prescribed steroids this week. Pt. States he baseline uses walker, but can hardly walk at baseline.

## 2023-01-28 IMAGING — CR DG ABDOMEN 1V
2 series · 2 of 2 positions shown · non-contrast
Comparison: CT abdomen and pelvis 07/01/2019

CLINICAL DATA: History of kidney stones.  Seizures.

EXAM:
ABDOMEN - 1 VIEW

[abdomen kub (1 of 2)]
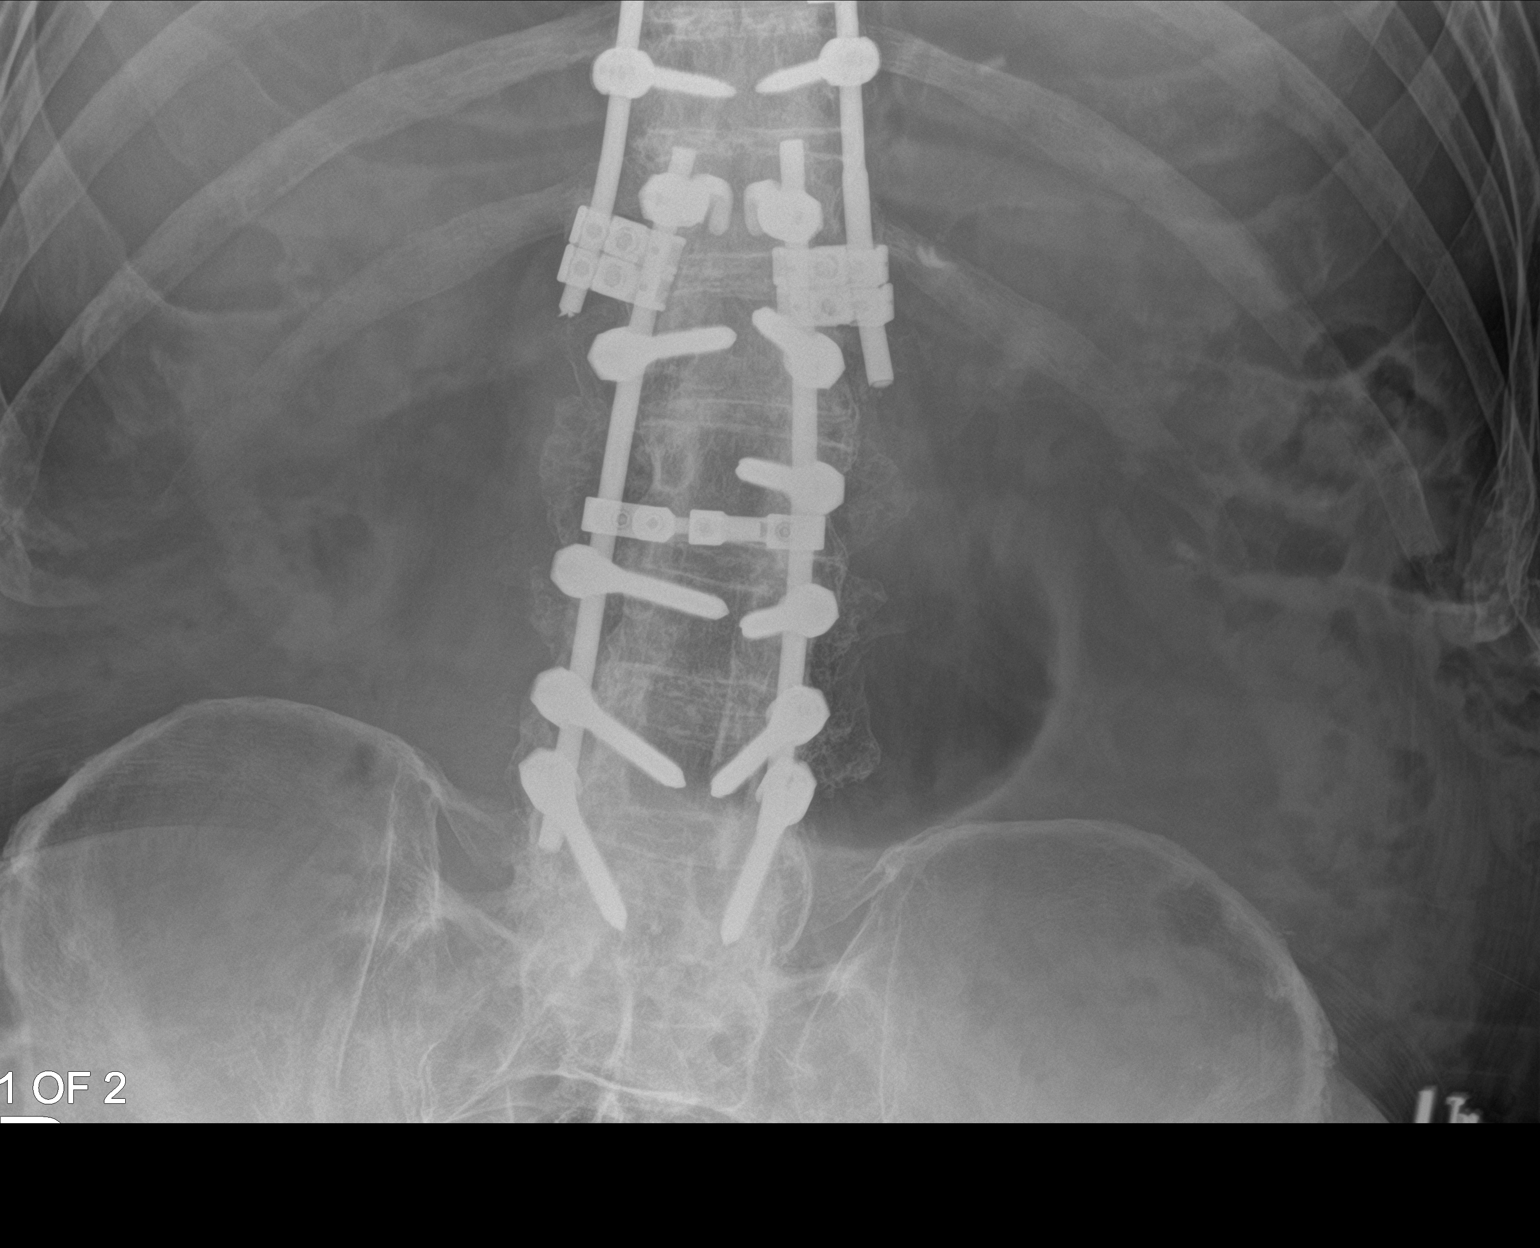

[abdomen kub (2 of 2)]
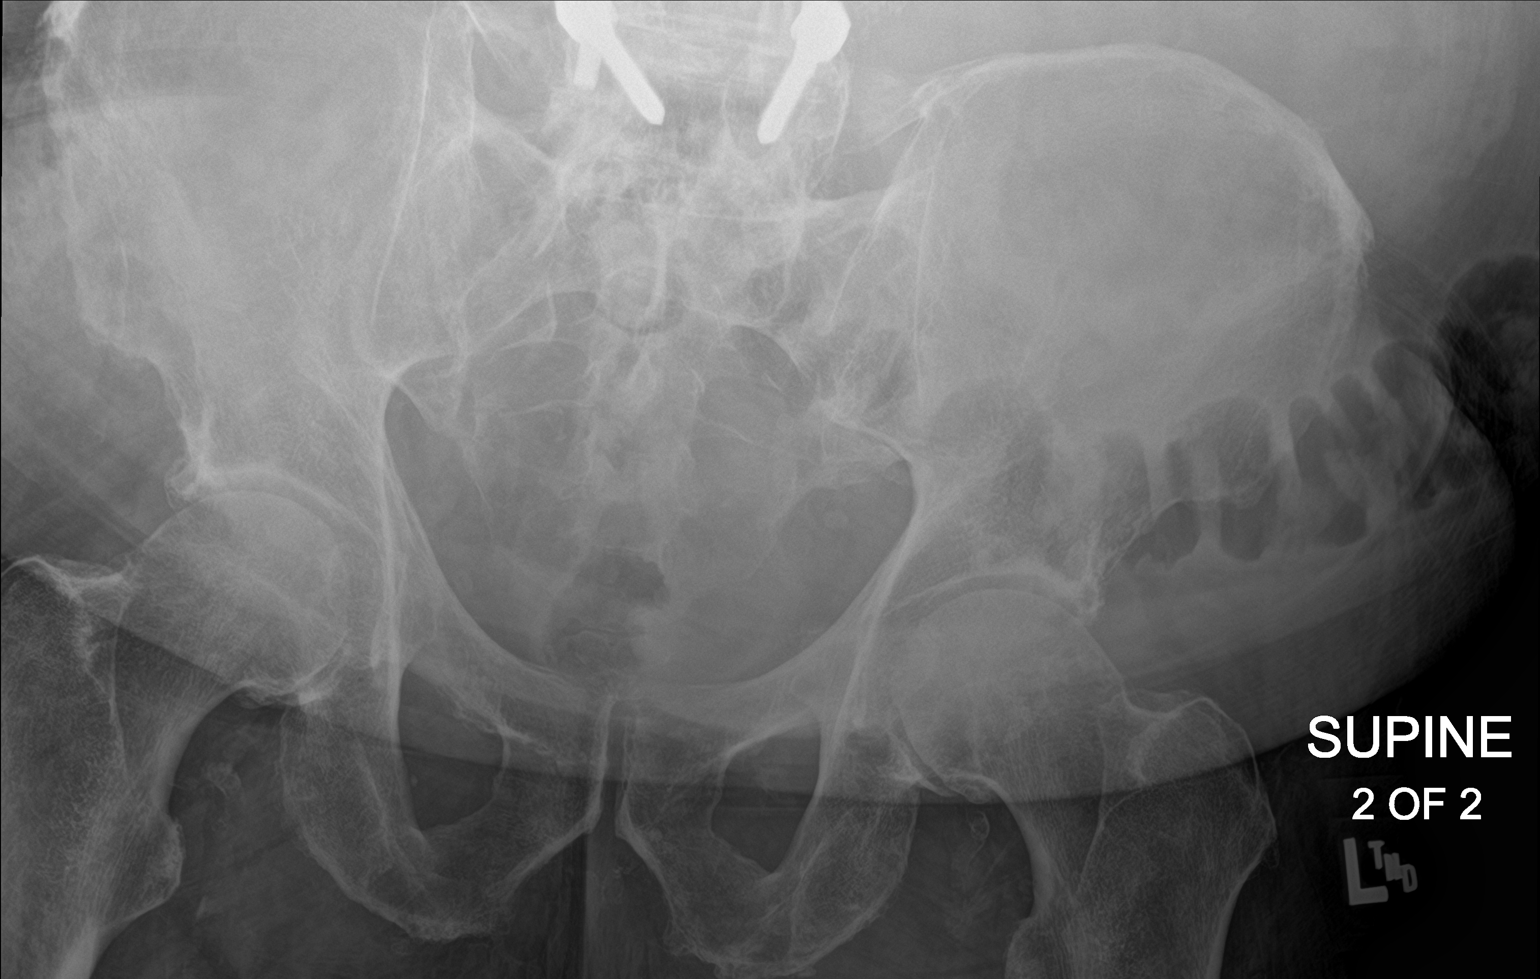

[2 of 2 positions shown; findings below may reference images not displayed]

FINDINGS: Normal bowel gas pattern with scattered gas and stool throughout the
colon. No small or large bowel distention. No radiopaque stones.
Postoperative changes with fixation of the lower thoracic and lumbar
spine. Bone grafts are suggested. Degenerative changes in the hips.
Soft tissue contours appear intact.
IMPRESSION: Nonobstructive bowel gas pattern.  No radiopaque stones identified.

## 2023-06-26 ENCOUNTER — Emergency Department

## 2023-06-26 ENCOUNTER — Emergency Department
Admission: EM | Admit: 2023-06-26 | Discharge: 2023-06-26 | Disposition: A | Discharge status: Home / Self Care | Attending: Emergency Medicine | Admitting: Emergency Medicine

## 2023-06-26 ENCOUNTER — Other Ambulatory Visit: Payer: Self-pay

## 2023-06-26 DIAGNOSIS — W28XXXA Contact with powered lawn mower, initial encounter: Secondary | ICD-10-CM | POA: Insufficient documentation

## 2023-06-26 DIAGNOSIS — S0990XA Unspecified injury of head, initial encounter: Secondary | ICD-10-CM | POA: Insufficient documentation

## 2023-06-26 DIAGNOSIS — M25511 Pain in right shoulder: Secondary | ICD-10-CM | POA: Diagnosis present

## 2023-06-26 DIAGNOSIS — S22019A Unspecified fracture of first thoracic vertebra, initial encounter for closed fracture: Secondary | ICD-10-CM | POA: Insufficient documentation

## 2023-06-26 DIAGNOSIS — W19XXXA Unspecified fall, initial encounter: Secondary | ICD-10-CM

## 2023-06-26 NOTE — ED Notes (Signed)
 Called to Ortho Tech @809  PM for CTO [employer].

## 2023-06-26 NOTE — Progress Notes (Signed)
 Orthopedic Tech Progress Note Patient Details:  Derek Andersen 27-Dec-1940 [id number]  Patient ID: Derek Andersen, male   DOB: Jul 16, 1940, 83 y.o.   MRN: [id number] Called in [employer] for CTO brace.  Rayna Calkin 06/26/2023, 8:18 PM

## 2023-06-26 NOTE — Discharge Instructions (Addendum)
 You have a fracture of your T1 vertebrae.  Please wear the brace provided today at all times until you follow-up with the neurosurgical team.  Please return for any worsening symptoms.  Your other x-rays and imaging showed no acute traumatic injuries.

## 2023-06-26 NOTE — ED Provider Notes (Signed)
 [facility] Provider Note    Event Date/Time   First MD Initiated Contact with Patient 06/26/23 1647     (approximate)   History   Fall   HPI Derek Andersen is a 83 y.o. male on dabigatran presenting today for fall.  Patient states he was riding on a tractor when he fell off the side of it.  He hit the right side of his head and right shoulder.  Initially did not come in but was having worsening right shoulder pain so presented to the ED for further evaluation.  He denies loss of consciousness.  Injury to right side of the head and right shoulder.  Denies pain along his rib cage or his lower extremities.  No left-sided pain.  Denies any numbness or weakness anywhere.     Physical Exam   Triage Vital Signs: ED Triage Vitals  Encounter Vitals Group     BP 06/26/23 1519 136/62     Systolic BP Percentile --      Diastolic BP Percentile --      Pulse Rate 06/26/23 1519 90     Resp 06/26/23 1519 18     Temp 06/26/23 1519 97.6 F (36.4 C)     Temp Source 06/26/23 1519 Oral     SpO2 06/26/23 1519 97 %     Weight 06/26/23 1522 210 lb (95.3 kg)     Height 06/26/23 1522 5\' 6"  (1.676 m)     Head Circumference --      Peak Flow --      Pain Score 06/26/23 1522 6     Pain Loc --      Pain Education --      Exclude from Growth Chart --     Most recent vital signs: Vitals:   06/26/23 1519  BP: 136/62  Pulse: 90  Resp: 18  Temp: 97.6 F (36.4 C)  SpO2: 97%   I have reviewed the vital signs. General:  Awake, alert, no acute distress. Head:  Normocephalic, Atraumatic. EENT:  PERRL, EOMI, Oral mucosa pink and moist, Neck is supple. Cardiovascular: Regular rate, 2+ distal pulses. Respiratory:  Normal respiratory effort, symmetrical expansion, no distress.   Extremities: Mild tenderness over the right anterior shoulder without any obvious deformity.  Nontender to C, T, or L-spine.  No tenderness palpation throughout the chest wall.  No tenderness  palpation throughout left side extremities or right lower extremity. Neuro:  Alert and oriented.  Interacting appropriately.   Skin:  Warm, dry, no rash.   Psych: Appropriate affect.    ED Results / Procedures / Treatments   Labs (all labs ordered are listed, but only abnormal results are displayed) Labs Reviewed - No data to display   EKG    RADIOLOGY Independently interpreted CT imaging and x-rays with only acute traumatic injury to T1 vertebrae.   PROCEDURES:  Critical Care performed: No  Procedures   MEDICATIONS ORDERED IN ED: Medications - No data to display   IMPRESSION / MDM / ASSESSMENT AND PLAN / ED COURSE  I reviewed the triage vital signs and the nursing notes.                              Differential diagnosis includes, but is not limited to, ICH, cervical spine injury, clavicle fracture, proximal humerus fracture, soft tissue injury  Patient's presentation is most consistent with acute presentation with potential threat to life  or bodily function.  Patient is an 83 year old male presenting today for ground-level fall with injury to the right side of his head and right shoulder on dabigatran.  Exam largely reassuring at this time with no focal deficits or obvious deformities.  Vital signs stable.  CT imaging and x-rays ordered for further evaluation of areas of pain and injury.  CT head with no acute traumatic injury.  X-rays of chest, shoulder, clavicle showed no acute traumatic injuries.  There was question of possible pneumonia but patient has no ongoing symptoms of cough, congestion, shortness of breath.  More likely related to atelectasis.  C-spine CT did show evidence of T1 fracture.  Discussed with neurosurgery, Dr. Felipe Horton.  Recommend CTO brace and follow-up with neurosurgery outpatient.  Brace provided and patient safe for discharge.  No other acute neurological symptoms at this time.  The patient is on the cardiac monitor to evaluate for evidence of  arrhythmia and/or significant heart rate changes. Clinical Course as of 06/26/23 1907  Thu Jun 26, 2023  0865 Neurosurgery, Dr. Felipe Horton recommends CTO brace and follow-up in clinic.  Orthotist consulted.  Discharge afterwards [DW]    Clinical Course User Index [DW] Kandee Orion, MD     FINAL CLINICAL IMPRESSION(S) / ED DIAGNOSES   Final diagnoses:  Closed fracture of first thoracic vertebra, unspecified fracture morphology, initial encounter [facility])  Fall, initial encounter     Rx / DC Orders   ED Discharge Orders          Ordered    Ambulatory referral to Neurosurgery        06/26/23 1906             Note:  This document was prepared using Dragon voice recognition software and may include unintentional dictation errors.   Kandee Orion, MD 06/26/23 Trenia Fritter

## 2023-06-26 NOTE — ED Triage Notes (Signed)
 Pt fell off of his lawn mower onto a cement floor in his shop and hit his head on a chain horse. Pt denies LOC. Pt is on blood thinners. Pt c/o R shoulder pain that radiates to his sternum and headache. Pt denies dizziness, n/v. Pt tender to palpation over lateral third of right clavicle.

## 2023-06-26 NOTE — ED Notes (Addendum)
 Brace rep arrived to fit and educate pt and family on brace setup and maintenance. Brace in place at this time.

## 2023-06-30 ENCOUNTER — Telehealth: Payer: Self-pay | Admitting: Neurosurgery

## 2023-06-30 NOTE — Telephone Encounter (Signed)
 Left message to call back

## 2023-06-30 NOTE — Telephone Encounter (Signed)
 Patient scheduled for 07/15/2023 and appointment reminder mailed.

## 2023-06-30 NOTE — Telephone Encounter (Signed)
 Carroll Clamp, MD  [facility]: app Timeline: 2-4week Tests to order: cervicothroaicc xrays ED 06/26/2023 closed fracture of the first thoracic vertebra

## 2023-07-09 ENCOUNTER — Emergency Department

## 2023-07-09 ENCOUNTER — Emergency Department
Admission: EM | Admit: 2023-07-09 | Discharge: 2023-07-10 | Disposition: A | Discharge status: Home / Self Care | Attending: Emergency Medicine | Admitting: Emergency Medicine

## 2023-07-09 DIAGNOSIS — R200 Anesthesia of skin: Secondary | ICD-10-CM | POA: Diagnosis not present

## 2023-07-09 DIAGNOSIS — R202 Paresthesia of skin: Secondary | ICD-10-CM | POA: Insufficient documentation

## 2023-07-09 DIAGNOSIS — W182XXA Fall in (into) shower or empty bathtub, initial encounter: Secondary | ICD-10-CM | POA: Insufficient documentation

## 2023-07-09 DIAGNOSIS — I2510 Atherosclerotic heart disease of native coronary artery without angina pectoris: Secondary | ICD-10-CM | POA: Diagnosis not present

## 2023-07-09 DIAGNOSIS — I10 Essential (primary) hypertension: Secondary | ICD-10-CM | POA: Insufficient documentation

## 2023-07-09 DIAGNOSIS — S12590A Other displaced fracture of sixth cervical vertebra, initial encounter for closed fracture: Secondary | ICD-10-CM | POA: Insufficient documentation

## 2023-07-09 DIAGNOSIS — E119 Type 2 diabetes mellitus without complications: Secondary | ICD-10-CM | POA: Diagnosis not present

## 2023-07-09 DIAGNOSIS — S199XXA Unspecified injury of neck, initial encounter: Secondary | ICD-10-CM | POA: Diagnosis present

## 2023-07-09 LAB — CBG MONITORING, ED: Glucose-Capillary: 104 mg/dL — ABNORMAL HIGH (ref 70–99)

## 2023-07-09 MED ORDER — HYDROCODONE-ACETAMINOPHEN 5-325 MG PO TABS
1.0000 | ORAL_TABLET | Freq: Once | ORAL | Status: AC
  Administered 2023-07-10: 1 via ORAL
  Filled 2023-07-09: qty 1

## 2023-07-09 NOTE — ED Provider Triage Note (Signed)
 Emergency Medicine Provider Triage Evaluation Note  Derek Andersen , a 83 y.o. male  was evaluated in triage.  Pt complains of fall, history of T1 fracture, hit head is on Eliquis , unable to stand without help when he usually is able to, had 1 episode of fecal incontinence but did feel the urge to defecate.  Review of Systems  Positive:  Negative:   Physical Exam  BP (!) 147/64   Pulse 74   Temp 98.9 F (37.2 C) (Oral)   Resp 18   Ht 5\' 6"  (1.676 m)   Wt 97.5 kg   SpO2 98%   BMI 34.70 kg/m  Gen:   Awake, no distress   Resp:  Normal effort  MSK:   Moves extremities without difficulty  Other:    Medical Decision Making  Medically screening exam initiated at 6:44 PM.  Appropriate orders placed.  Derek Andersen was informed that the remainder of the evaluation will be completed by another provider, this initial triage assessment does not replace that evaluation, and the importance of remaining in the ED until their evaluation is complete.  Patient wants to be transferred to [facility].  Did explain to him we cannot transfer him from triage.  He will need to be seen by physician in the back he would then have to talk to his specialist.  CTs ordered   Delsie Figures, PA-C 07/09/23 1845

## 2023-07-09 NOTE — ED Triage Notes (Signed)
 Pt presents to the ED via [facility] from home. Pt has a known T1 fx and is supposed to wear a brace. Pt does not wear the brace because he finds it uncomfortable. Pt has a significant medical hx with his spine. Pt follows closely with neuro spine. Has had multiple fusions in his back. Pt reports falling in the shower and reports bilateral arm numbness following the fall. Pt states that the numbness has subsided some, but states that it is still present.Pt states that he hit his head. Pt does take a blood thinner.   Evaluated by Aneta Keepers, PA at time of triage.   Called CT to inform them that patient can't lay flat on his back for CT scans. Pt and family state that he will have a syncopal episode if he lies flat on his back. Pt must lay on his side for CT.

## 2023-07-09 NOTE — ED Notes (Signed)
 C collar placed at this time.

## 2023-07-09 NOTE — ED Notes (Signed)
 Pt cleaned, clean linens and brief placed on pt.

## 2023-07-09 NOTE — ED Triage Notes (Signed)
 First nurse note: Pt had a fall in the shower today. Pt reports numbness bilaterally after to arms. Pt reports weakness to legs. Pt with hx multiple spine surgery's. Pt requesting to go [facility]. VSS   87 HR  97% RA 128/65

## 2023-07-09 NOTE — ED Provider Notes (Signed)
 [facility] Provider Note    Event Date/Time   First MD Initiated Contact with Patient 07/09/23 2259     (approximate)   History   Fall   HPI  Derek Andersen is a 83 y.o. male with history of CAD, hypertension, hyperlipidemia, type 2 diabetes, neuropathy, BPH, and gait instability who presents with a neck injury after a fall.  The patient states that few hours ago he was in the shower when his legs gave out, he felt lightheaded, and fell backwards, hurting his neck.  Since that time he has had neck pain.  He initially had tingling to both hands as well as pain going down the right arm.  The tingling has resolved and he has no numbness.  He does still have some right arm pain although it is improved.  The patient also reports that he has chronic gait instability and difficulty walking, but his walking is somewhat worse tonight.  However he denies any acute weakness or numbness to his legs.  He said he also fell several days ago in a similar manner although did not come to get checked out at that time.  He had a recent ED visit with a T1 fracture and was placed in a brace although states he does not wear it.  He has follow-up arranged with neurosurgery for next week.  I reviewed the past medical records for the patient's most recent outpatient encounter was with podiatry on 3/5 for follow-up and diabetic foot care.  I confirmed that he was seen in the ED on 4/17 after a fall and was diagnosed with a T1 fracture and placed in a brace.  Physical Exam   Triage Vital Signs: ED Triage Vitals  Encounter Vitals Group     BP 07/09/23 1827 (!) 147/64     Systolic BP Percentile --      Diastolic BP Percentile --      Pulse Rate 07/09/23 1827 74     Resp 07/09/23 1827 18     Temp 07/09/23 1827 98.9 F (37.2 C)     Temp Source 07/09/23 1827 Oral     SpO2 07/09/23 1827 98 %     Weight 07/09/23 1831 215 lb (97.5 kg)     Height 07/09/23 1831 5\' 6"  (1.676 m)     Head  Circumference --      Peak Flow --      Pain Score 07/09/23 1830 9     Pain Loc --      Pain Education --      Exclude from Growth Chart --     Most recent vital signs: Vitals:   07/09/23 1827 07/09/23 2254  BP: (!) 147/64 (!) 172/72  Pulse: 74 78  Resp: 18 18  Temp: 98.9 F (37.2 C)   SpO2: 98% 95%     General: Awake, no distress.  CV:  Good peripheral perfusion.  Resp:  Normal effort.  Abd:  No distention.  Other:  5/5 motor strength and sensation of bilateral upper and lower extremities, proximal and distal.   ED Results / Procedures / Treatments   Labs (all labs ordered are listed, but only abnormal results are displayed) Labs Reviewed  CBG MONITORING, ED - Abnormal; Notable for the following components:      Result Value   Glucose-Capillary 104 (*)    All other components within normal limits     EKG     RADIOLOGY  CT head: I independently  reviewed and interpreted the images; there is no ICH.  Radiology report indicates no acute intracranial abnormality.  CT cervical spine:  IMPRESSION:  1. No acute intracranial abnormality.  2. Interval development of a clay-shoveler's fracture of the C6  spinous process with mild displacement of the peripheral fracture  fragment.  3. Stable appearance of a Chance fracture of T1 with a transverse  mildly displaced fracture extending through the spinous processes,  the pedicles of T1 bilaterally, and into the transverse processes of  T1 on the right. No significant associated angulatory deformity,  canal stenosis or listhesis.  4. Extensive postoperative changes of the cervical spine as  described above.  5. High-grade canal stenosis at C5-6 with a posterior disc  osteophyte complex flattening the thecal sac with an AP diameter of  approximately 5-6 mm.  6. Multilevel severe neuroforaminal narrowing, most severe  bilaterally at C6-T1.   CT thoracic/lumbar spine:  MPRESSION  1. No acute fracture in the  thoracic or lumbar spine. Fracture of  the C7 spinous process. See report from same day CT cervical spine  for details.  2. Redemonstrated fracture through the T1 spinous process extending  through the bilateral pedicles and into the right transverse process  of T1. This appears similar to CT of the cervical spine 06/26/2023.  3. Subtotal atelectasis of the left lower lobe. Pneumonia is not  excluded. Small left pleural effusion.     PROCEDURES:  Critical Care performed: No  Procedures   MEDICATIONS ORDERED IN ED: Medications  HYDROcodone -acetaminophen  (NORCO/VICODIN) 5-325 MG per tablet 1 tablet (1 tablet Oral Given 07/10/23 0006)     IMPRESSION / MDM / ASSESSMENT AND PLAN / ED COURSE  I reviewed the triage vital signs and the nursing notes.  83 year old male with PMH as noted above presents with neck pain after a near syncopal episode and resulting fall, along with some tingling in both hands that has now resolved, and worsened difficulty with gait.  On exam he has intact motor strength in bilateral upper and lower extremities and no significant acute numbness.  Differential diagnosis includes, but is not limited to, cervical spinal fracture, contusion, spine injury, vasovagal episode, dehydration, electrolyte abnormality, other cause of near syncope.  Patient's presentation is most consistent with acute presentation with potential threat to life or bodily function.  1.  Fall/injury: CT cervical spine shows a new clay shoveler's fracture of C6.  There is also canal stenosis in the C5-C6 range although this appears to be worsening of a chronic finding.  Thoracic and lumbar spine are negative for acute fractures.  CT head is negative.  Initially the patient stated that he wanted to be transferred to [facility].  I explained that we need to do the initial workup in the ED here first before determining disposition.  I also advised that we are happy to contact [facility], however if there is no  diagnosis requires transfer for higher level of care, this would be a patient initiated transfer and I cannot guarantee the [facility] will take him.  On exam, he has no acute neurologic deficits.  We initially ordered an MRI, however the patient states that he is completely unable to lie flat due to some type of syncope/seizure-like episodes and adamantly refuses the MRI for this reason.  He understands that without doing so we cannot completely rule out nerve injury.  However, there is no clinical evidence that he has any acute nerve injury.  I consulted Dr. Felipe Horton from neurosurgery who advised  that there is no recommendation for acute intervention for the C6 fracture.  The patient may follow-up outpatient as is already scheduled.  2. Gait difficulty: Although he has no lower extremity neurologic deficits on exam, the patient initially reported newly not being able to walk.  I therefore had initially planned to obtain lab workup and admit him, but on further discussion he stay has chronic gait instability and difficulty, walks minimally at baseline, and does not feel that this is a significant change.  Once he found out that the fracture does not require surgical intervention, he stated that he wanted to go home.  He states that with his current home health services, he will have no issue with his ongoing gait instability, and feels completely safe to go home.  His family member agrees.  3.  Near syncope: The patient states that this is a chronic issue, he has had numerous episodes in the past, and extensive workup previously which has been negative.  I had initially ordered labs including troponin as well as an EKG.  The patient stated that these were not necessary since this is a chronic issue.  I advised that the labs would still be needed for admission.  Once we determine that he does not want to be admitted, I canceled the workup.  At this time, the patient is stable for discharge home.  I counseled him  extensively on the results of the workup and plan of care.  He will follow-up with neurosurgery next week as scheduled.  I gave him strict return precautions and he expressed understanding.   FINAL CLINICAL IMPRESSION(S) / ED DIAGNOSES   Final diagnoses:  Other closed displaced fracture of sixth cervical vertebra, initial encounter (HCC)     Rx / DC Orders   ED Discharge Orders     None        Note:  This document was prepared using Dragon voice recognition software and may include unintentional dictation errors.    Lind Repine, MD 07/10/23 [phone]

## 2023-07-10 NOTE — Discharge Instructions (Signed)
 You have what is called a "clay shovelers fracture" of the C6 vertebra from the fall today.  You also have a T1 fracture from the fall on 4/17.  You should follow-up with neurosurgery next week as scheduled.  You may take your normal pain medication.  In the meantime, return to the ER immediately for new, worsening, or persistent severe arm or leg numbness or weakness, difficulty using the arms or legs, inability to walk or move your legs as well as you normally able to, incontinence, or any other new or worsening symptoms that concern you.

## 2023-07-14 NOTE — Progress Notes (Deleted)
 Referring Physician:  Nestor Banter, MD [phone] [address] [facility] [place],  [place] [address]  Primary Physician:  Nestor Banter, MD  History of Present Illness: 07/14/2023*** [name] has a history of ***  Neck and Back pain? He had a fall and presented to the ER which revealed that he had a fracture.   Duration: *** Location: *** Quality: *** Severity: ***  Precipitating: aggravated by *** Modifying factors: made better by *** Weakness: none Timing: *** Bowel/Bladder Dysfunction: none   Past Surgery: *** Back Surgery??  Derek Andersen has ***no symptoms of cervical myelopathy.  The symptoms are causing a significant impact on the patient's life.   Review of Systems:  A 10 point review of systems is negative, except for the pertinent positives and negatives detailed in the HPI.  Past Medical History: Past Medical History:  Diagnosis Date   Arthritis    Atrial fibrillation (HCC)    a.) CHA2DS2-VASc = 7 (age x 2, HTN, DVT x 2, prior MI, T2DM). b.) s/p TEE with cardioversion (200J x 2) 12/06/2013. c.) chronically anticoagulated using apixaban    BPH (benign prostatic hyperplasia)    CAD (coronary artery disease)    a.) MI 12/31/83 -> LHC: 95% pLAD-1, 100% pLAD-2, 50% mLAD. b.) LHC 05/08/88: 50 pRCA, 95% mLAD. c.) LHC 02/17/02: 25% RCA, 25% LM, 75% OM1; PCI -> 0.25 x 12mm Medtronic stent to OM1. d.) LHC 08/17/02: <25% OM1, 25% RI, 25% LAD. e.) LHC 09/02/03: 100% pLAD, 75% mLAD-1, 50% mLAD-2, <25% pRCA, <25% LM, 25% pLCx, <25% OM2, 25% RI. f.) LHC 11/23/13: 100% LAD; ref to CVTS. g.) MIDCAB (LIMA-LAD) 11/29/13   CKD (chronic kidney disease), stage IV (HCC)    Deep vein thrombosis (DVT) of right lower extremity (HCC) 1985   DISH (diffuse idiopathic skeletal hyperostosis)    Erectile dysfunction    Gait instability    GERD (gastroesophageal reflux disease)    History of 2019 novel coronavirus disease (COVID-19) 11/20/2018    HLD (hyperlipidemia)    Hypertension    IDA (iron deficiency anemia)    Long term current use of anticoagulant    a.) Apixaban    MI (myocardial infarction) (HCC) 12/31/1983   a.) treated in [place], [place]. b.) LHC: 95% pLAD-1, 100% pLAD-2, 50% mLAD; no intervention.   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 b.) gun in the home   T2DM (type 2 diabetes mellitus) (HCC)     Past Surgical History: Past Surgical History:  Procedure Laterality Date   ANKLE SURGERY Right 1989   plate in ankle   BACK SURGERY     11x Degernerative disk   CARDIAC CATHETERIZATION Left 08/17/2002   Procedure: CARDIAC CATHETERIZATION; Location: [facility]; Surgeon: Alyn Babe, MD   CARDIAC CATHETERIZATION Left 11/23/2013   Procedure: CARDIAC CATHETERIZATION; Location: [facility]; Surgeon: Rochelle Chu, MD   CARDIAC CATHETERIZATION Left 12/31/1983   Procedure: CARDIAC CATHETERIZATION; Location: [facility]; Surgeon: Taffy Fabian, MD   CARDIAC CATHETERIZATION Left 05/08/1988   Procedure: CARDIAC CATHETERIZATION; Location: [facility]; Surgeon: Dalphine Duet, MD  CARDIAC CATHETERIZATION Left 09/02/2003   Procedure: CARDIAC CATHETERIZATION; Location: [facility]; Surgeon: Toula French, MD   CORONARY ANGIOPLASTY WITH STENT PLACEMENT Left 02/17/2002   Procedure: CORONARY ANGIOPLASTY WITH STENT PLACEMENT (0.25 x 12 mm Medtronic stent to OM1); Location: [facility]   CYSTOSCOPY W/ RETROGRADES  02/09/2021   Procedure: CYSTOSCOPY WITH RETROGRADE PYELOGRAM;  Surgeon: Lawerence Pressman, MD;  Location: [facility];  Service: Urology;;   CYSTOSCOPY/URETEROSCOPY/HOLMIUM LASER/STENT PLACEMENT Left 02/09/2021   Procedure:  CYSTOSCOPY/URETEROSCOPY/HOLMIUM LASER/STENT PLACEMENT;  Surgeon: Lawerence Pressman, MD;  Location: [facility];  Service: Urology;  Laterality: Left;   MINIMALLY INVASIVE DIRECT CORONARY ARTERY BYPASS (MIDCAB; LIMA -LAD) Left 11/29/2013   Procedure: MINIMALLY INVASIVE DIRECT CORONARY ARTERY BYPASS (MIDCAB; LIMA -LAD); Location: [facility]    Allergies: Allergies as of 07/15/2023 - Review Complete 07/09/2023  Allergen Reaction Noted   Oxycodone   01/06/2013   Latex  11/05/2017    Medications: Outpatient Encounter Medications as of 07/15/2023  Medication Sig   acetaminophen  (TYLENOL ) 325 MG tablet Take 650 mg by mouth every 6 (six) hours as needed for headache.   amLODipine  (NORVASC ) 5 MG tablet Take 2 tablets (10 mg total) by mouth daily.   amoxicillin -clavulanate (AUGMENTIN ) 875-125 MG tablet Take 1 tablet by mouth 2 (two) times daily.   aspirin  81 MG EC tablet Take 81 mg by mouth daily.   brimonidine  (ALPHAGAN ) 0.2 % ophthalmic solution Place 1 drop into both eyes 3 (three) times daily.   buPROPion  (WELLBUTRIN  XL) 300 MG 24 hr tablet Take 300 mg by mouth daily.   calcium carbonate (TUMS EX) 750 MG chewable tablet Chew 750 mg by mouth in the morning and at bedtime.   Cholecalciferol  25 MCG (1000 UT) capsule Take 1 tablet by mouth daily.   cyanocobalamin (VITAMIN B12) 1000 MCG tablet Take 1,000 mcg by mouth daily.   dabigatran  (PRADAXA ) 75 MG CAPS capsule Take 75 mg by mouth 2 (two) times daily.   diclofenac Sodium (VOLTAREN) 1 % GEL Apply 2 g topically in the morning and at bedtime.   Dulaglutide 4.5 MG/0.5ML SOPN Inject 4.5 mg into the skin every Monday.   empagliflozin (JARDIANCE) 25 MG TABS tablet Take 25 mg by mouth daily.   escitalopram  (LEXAPRO ) 10 MG tablet Take 15 mg by mouth daily.   ferrous sulfate  325 (65 FE) MG tablet Take 325 mg by mouth daily.   finasteride  (PROSCAR ) 5 MG tablet Take 5 mg by mouth daily.   fluticasone  (FLONASE ) 50 MCG/ACT nasal spray Place 2 sprays into both nostrils  daily.   hydrALAZINE  (APRESOLINE ) 50 MG tablet Take 1 tablet (50 mg total) by mouth 3 (three) times daily.   hydrocortisone 2.5 % cream Apply 1 Application topically 2 (two) times daily as needed (facial rash).   insulin  aspart (NOVOLOG ) 100 UNIT/ML injection Inject 0-50 Units into the skin 3 (three) times daily before meals.   Insulin  Glargine (BASAGLAR  KWIKPEN) 100 UNIT/ML Inject 35 Units into the skin daily.   ketoconazole (NIZORAL) 2 % shampoo Apply 1 Application topically 3 (three) times a week.   magnesium oxide (MAG-OX) 400 MG tablet Take 400 mg by mouth daily.   Multiple Vitamin (MULTIVITAMIN WITH MINERALS) TABS tablet Take 1 tablet by mouth daily.   Omega-3 Fatty Acids (FISH OIL) 1200 MG CAPS Take 2,400 mg by mouth in the morning and at bedtime.   pantoprazole  (PROTONIX ) 40 MG tablet Take 40 mg by mouth daily.   pravastatin  (PRAVACHOL ) 40 MG tablet Take 40 mg by mouth daily.   tamsulosin  (  FLOMAX ) 0.4 MG CAPS capsule Take 0.4 mg by mouth at bedtime.   No facility-administered encounter medications on file as of 07/15/2023.    Social History: Social History   Tobacco Use   Smoking status: Never   Smokeless tobacco: Never  Vaping Use   Vaping status: Never Used  Substance Use Topics   Alcohol use: Not Currently   Drug use: Not Currently    Family Medical History: No family history on file.  Physical Examination: There were no vitals filed for this visit.  General: Patient is well developed, well nourished, calm, collected, and in no apparent distress. Attention to examination is appropriate.  Respiratory: Patient is breathing without any difficulty.   NEUROLOGICAL:     Awake, alert, oriented to person, place, and time.  Speech is clear and fluent. Fund of knowledge is appropriate.   Cranial Nerves: Pupils equal round and reactive to light.  Facial tone is symmetric.    *** ROM of cervical spine *** pain *** posterior cervical tenderness. *** tenderness in bilateral  trapezial region.   *** ROM of lumbar spine *** pain *** posterior lumbar tenderness.   No abnormal lesions on exposed skin.   Strength: Side Biceps Triceps Deltoid Interossei Grip Wrist Ext. Wrist Flex.  R 5 5 5 5 5 5 5   L 5 5 5 5 5 5 5    Side Iliopsoas Quads Hamstring PF DF EHL  R 5 5 5 5 5 5   L 5 5 5 5 5 5    Reflexes are ***2+ and symmetric at the biceps, brachioradialis, patella and achilles.   Hoffman's is absent.  Clonus is not present.   Bilateral upper and lower extremity sensation is intact to light touch.     Gait is normal.   ***No difficulty with tandem gait.    Medical Decision Making  Imaging: ***  I have personally reviewed the images and agree with the above interpretation.  Assessment and Plan: Derek Andersen is a pleasant 83 y.o. male has ***  Treatment options discussed with patient and following plan made:   - Order for physical therapy for *** spine ***. Patient to call to schedule appointment. *** - Continue current medications including ***. Reviewed dosing and side effects.  - Prescription for ***. Reviewed dosing and side effects. Take with food.  - Prescription for *** to take prn muscle spasms. Reviewed dosing and side effects. Discussed this can cause drowsiness.  - MRI of *** to further evaluate *** radiculopathy. No improvement time or medications (***).  - Referral to [facility] at [facility] to discuss possible *** injections.  - Will schedule phone visit to review MRI results once I get them back.   I spent a total of *** minutes in face-to-face and non-face-to-face activities related to this patient's care today including review of outside records, review of imaging, review of symptoms, physical exam, discussion of differential diagnosis, discussion of treatment options, and documentation.   Thank you for involving me in the care of this patient.   Lucetta Russel PA-C Dept. of Neurosurgery

## 2023-07-15 ENCOUNTER — Ambulatory Visit: Admitting: Orthopedic Surgery

## 2023-07-21 NOTE — Discharge Summary (Addendum)
 Discharge to Outside Facility: RN  Report called to *** by *** at *** Discharge Unit: [id number] Discharge Unit Phone #: ***  Transport Equipment/Life Support Measures Needed    IV Access:     Peripheral IV 07/11/23 Right Antecubital (Active)  Site Assessment Clean, Dry, Intact 07/21/23 1100  Joint Stabilization None 07/21/23 1100  Line Status/ Intervention Saline flushed;Saline locked;Patent;No blood return 07/21/23 1100  Needleless Connector Change 07/18/23 07/21/23 1100  Dressing Type Transparent 07/21/23 1100  Dressing Status/ Intervention Clean, Dry, Intact 07/21/23 1100  Dressing Last Changed 07/14/23 07/21/23 1100  Number of days: 10     Peripheral IV 07/11/23 Left;Lower Forearm (Active)  Site Assessment Clean, Dry, Intact 07/21/23 1100  Joint Stabilization None 07/21/23 1100  Line Status/ Intervention Saline flushed;Saline locked;Patent;No blood return 07/21/23 1100  Needleless Connector Change 07/18/23 07/20/23 2000  Dressing Type Transparent 07/21/23 1100  Dressing Status/ Intervention Clean, Dry, Intact 07/21/23 1100  Dressing Last Changed 07/14/23 07/21/23 1100  Number of days: 10                    Oxygen: via O2 Device: Nasal cannula (07/14/23 0800)   Pulse Oximeter: SpO2: 94 % (07/21/23 0820)    Drains/Tubes:     Urethral Catheter (Active)  Reason for Continuing Urinary Catheterization  Neurogenic bladder 07/21/23 0126  Daily maintenance bundle elements verified Yes (see row info) 07/21/23 0126  Securement Method Securement Device 07/21/23 0126  Site Assessment Clean, Dry, Intact 07/21/23 0126  Collection Container Standard drainage bag 07/21/23 0126  Intake (mL) 0 ml 07/21/23 0126  Output (mL) 350 mL 07/21/23 0300  Number of days: 5       Precautions/Positioning:    Vital Signs and Patient Condition At Time of Transfer    Vitals:     Vitals:   07/21/23 0820  BP: (!) 147/57  Pulse:   Resp: 17  Temp: 36.7 C (98.1 F)     Mental Status:    (WDL = Within Defined Limits)           Orientation Level: Oriented X4 (07/21/23 0900)   Cognition: Appropriate judgement, Appropriate safety awareness, Appropriate attention/concentration (07/21/23 0900)   Communication: Verbal (07/21/23 0900)   Pain:    Pain Assessment %%: 0-10 (07/21/23 0729)    Pain Score %%:   8 (07/21/23 0729)    PAINAD Score: 0 (07/12/23 2100)  Nursing Summary    Activities of Daily Living:    Overall ADL Status: Maximal Assist (Pt. performs 25-49%) (07/14/23 1201)   Mobility: Very limited (07/21/23 0900)   Activity: Chairfast (07/21/23 0900)   Sensory Aides:            Elimination:   Stool: 1 mL (07/20/23 0002)   Stool Occurrence: 1 (07/20/23 2000)   Urinary Incontinence: Yes (07/20/23 2000)   Bowel Incontinence: Yes (07/21/23 0900)   Nutrition:   Does the pt require assistance with eating?: Total assist (07/21/23 0600)           Skin Condition / Wounds:     Wound 06/24/18 Medial Neck (Active)  Number of days: 1853     Wound 07/11/23 Posterior Back (Active)  Wound Assessment Pink 07/21/23 0900  Peri-wound Assessment Pink 07/21/23 0900  Closure Staples 07/21/23 0900  Drainage Amount None 07/21/23 0900  Dressing Type Open to air 07/21/23 0900  Specialty Dressing Type Hypoallergenic tape 07/14/23 0800  Dressing Status/ Intervention Open to Air 07/21/23 0900  Dressing Last Changed 07/11/23 07/21/23 0900  Number  of days: 10     Wound 07/12/23 Right Elbow (Active)  Wound Image   07/12/23 0800  Wound Assessment Scab 07/21/23 0900  Peri-wound Assessment Pink 07/21/23 0900  Closure None 07/21/23 0900  Drainage Amount None 07/21/23 0900  Drainage Description Serosanguineous 07/15/23 2100  Dressing Type Specialty dressing 07/20/23 2000  Specialty Dressing Type Silicone foam border 07/20/23 2000  Dressing Status/ Intervention Clean;Dry 07/21/23 0900  Dressing Last Changed 07/15/23 07/20/23 2000  Number of days: 9    (RN to validate  provider order for wound care)  Signed By: SAMMI NEIGHBORS

## 2023-07-21 NOTE — Discharge Summary (Signed)
 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, subsequent encounter (CMS/HHS-HCC) [S14.109D] Fall, initial encounter [W19.XXXA] Weakness of both lower extremities [R29.898]   Consults IP CONSULT TO ORTHOPEDIC SURGERY IP CONSULT TO GEN MED (HOSPITALIST) IP CONSULT TO HEMATOLOGY IP CONSULT TO ORTHOTICS/PROSTHETICS IP CONSULT TO ENDOCRINOLOGY REQUEST FOR GEN MED TRANSFER IP CONSULT TO UM REFERRAL SPECIALIST [facility] IP CASE MANAGEMENT - CONSULT TO CARE COORDINATION SPECIALIST [facility] IP CASE MANAGEMENT - CONSULT FOR POST-ACUTE AUTHORIZATION [facility] IP CASE MANAGEMENT - CONSULT FOR POST-ACUTE AUTHORIZATION [facility] IP CASE MANAGEMENT - CONSULT TO CARE COORDINATION SPECIALIST  HISTORY OF PRESENT ILLNESS:  Per H&P  83 y.o. male with PMH of DISH, afib (dabigatran ), CAD (aspirin ), T2DM (6.1%), chronic bilateral lower extremity sensory neuropathy with extensive history of spine surgery ([facility] PSF C1-4, ASF C6-7, PSF T10-L5 2016-2020) presenting with severe low back pain and bilateral lower extremity weakness after mechanical fall yesterday.   Patient has had four falls in last month with most recent fall occurring yesterday in the shower. Diagnosed with T1 fracture on 4/17. 07/09/23 [facility] provider note references radiology read of C7 spinous fracture and stable T1 chance fracture. He was discharged but then presented to [facility] 07/10/23 for new bilateral lower extremity weakness and loss of bowel/bladder function.   Prior to this fall, he ambulated with cane and walker.   A 10 point review of systems was reviewed  and negative except for what is stated above.   PAST MEDICAL HISTORY Past Medical History:  Diagnosis Date  . Atrial fibrillation (CMS/HHS-HCC)   . CAD (coronary artery disease)   . Closed fracture of eleventh thoracic vertebra, unspecified fracture morphology, initial encounter (CMS/HHS-HCC) 04/18/2014  . DM (diabetes mellitus), type 2, uncontrolled, with renal complications   . ED (erectile dysfunction)   . Family history of colon cancer   . Glaucoma (increased eye pressure)   . Heart attack (CMS/HHS-HCC)   . Heart disease   . Hiatal hernia   . History of cancer 2013 or 2014   Skin Cancer Only  . History of coronary artery stent placement 11/26/2013  . History of glaucoma   . History of motion sickness   . HTN (hypertension)   . Hyperlipidemia   . Hypertension. 05/03/2011  . Kidney stone   . MI (myocardial infarction) (CMS/HHS-HCC)   . Neuropathy    related to back per pt   . Obesity   . PONV (postoperative nausea and vomiting)   . Rheumatoid pannus of cervical spine (CMS/HHS-HCC)   . Sleep apnea   . Spinal stenosis in cervical region 03/01/2013  . 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 Type 2 diabetes, uncontrolled, with neuropathy 02/10/2013    PAST SURGICAL HISTORY Past Surgical History:  Procedure Laterality Date  . MINIMALLY INVASIVE DIRECT CORONARY ARTERY BYPASS W/SINGLE ARTERY GRAFT N/A 11/29/2013   Procedure: MINIMALLY INVASIVE DIRECT CORONARY ARTERY BYPASS W/SINGLE ARTERY GRAFT, MICS, Left internal mammary artery harvest;  Surgeon: Norleen Dow Exon, MD;  Location: [facility];  Service: Cardiothoracic;  Laterality: N/A;  . CORONARY ARTERY BYPASS W/SINGLE ARTERY GRAFT OFF PUMP N/A 11/29/2013   Procedure: CORONARY  ARTERY BYPASS W/SINGLE ARTERY GRAFT OFF PUMP;  Surgeon: Norleen Dow Exon, MD;  Location: [facility];  Service: Cardiothoracic;  Laterality: N/A;  . TRANSESOPHAGEAL ECHOCARDIOGRAPHY N/A 11/29/2013   Procedure: TRANSESOPHAGEAL ECHOCARDIOGRAPHY;  Surgeon: Norleen Dow Exon, MD;  Location: [facility];  Service: Cardiothoracic;  Laterality: N/A;  performed by anesthesia  . ARTHRODESIS POSTERIOR LUMBAR SPINE W/LAMINECTOMY/DISCECTOMY N/A 04/17/2014   Procedure: ARTHRODESIS LUMBAR POSTERIOR/POSTEROLATERAL W/POSTERIOR INTERBODY INCLUDING LAMINECTOMY/DISCECTOMY;  Surgeon: Unice Gustav Robinette Reno, MD;  Location: [facility];  Service: Orthopedics;  Laterality: N/A;  . ARTHRODESIS ANTERIOR CERVICLE SPINE N/A 03/15/2015   Procedure: ARTHRODESIS, ANTERIOR INTERBODY, INCLUDING DISC SPACE PREPARATION, DISCECTOMY, OSTEOPHYTECTOMY AND DECOMPRESSION OF SPINAL CORD AND/ORNERVE ROOTS; CERVICAL BELOW C2 C6-C7;  Surgeon: Unice Gustav Robinette Reno, MD;  Location: [facility];  Service: Orthopedics;  Laterality: N/A;  . INSTRUMENTATION ANTERIOR SPINE 2 TO 3 VERTEBRAL SEGMENTS N/A 03/15/2015   Procedure: ANTERIOR INSTRUMENTATION; 2 TO 3 VERTEBRAL SEGMENTS (LIST IN ADDITION TO PRIMARY PROCEDURE);  Surgeon: Unice Gustav Robinette Reno, MD;  Location: [facility];  Service: Orthopedics;  Laterality: N/A;  . INSERTION STRUCTURAL BONE ALLOGRAFT FOR SPINE SURGERY N/A 03/15/2015   Procedure: ALLOGRAFT, STRUCTURAL, FOR SPINE SURGERY ONLY (LIST IN ADDITION TO PRIMARY PROCEDURE);  Surgeon: Unice Gustav Robinette Reno, MD;  Location: [facility];  Service: Orthopedics;  Laterality: N/A;  . AUTOGRAFT STRUCTURAL ICBG OBTAINED SEPARATE INCISION FOR SPINE SURGERY N/A 03/15/2015   Procedure: AUTOGRAFT FOR SPINE SURGERY ONLY (INCL HARVESTING GRAFT); STRUCTURAL, BICORTICAL OR TRICORTICAL (THROUGH SEPARATE SKIN OR FASCIAL INCISION) (LIST IN ADDITION TO PRIMARY PROCEDURE);  Surgeon: Unice Gustav Robinette Reno, MD;  Location: [facility];   Service: Orthopedics;  Laterality: N/A;  . POSTERIOR THORACIC SPINE FUSION/ARTHRODESIS ONE LEVEL N/A 12/13/2015   Procedure: ARTHRODESIS, POSTERIOR OR POSTEROLATERAL TECHNIQUE, SINGLE LEVEL; THORACIC (WITH LATERAL TRANSVERSE TECHNIQUE, WHEN PERFORMED) T4-T5;  Surgeon: Unice Gustav Robinette Reno, MD;  Location: [facility];  Service: Orthopedics;  Laterality: N/A;  . INSTRUMENTATION POSTERIOR SPINE 3 TO 6 VERTEBRAL SEGMENTS N/A 12/13/2015   Procedure: POSTERIOR SEGMENTAL INSTRUMENTATION (EG, PEDICLE FIXATION, DUAL RODS WITH MULTIPLE HOOKS AND SUBLAMINAR WIRES); 3 TO 6 VERTEBRAL SEGMENTS (LIST IN ADDITION TO PRIMARY PROCEDURE) T4-T8 Synthes/Depuy;  Surgeon: Unice Gustav Robinette Reno, MD;  Location: [facility];  Service: Orthopedics;  Laterality: N/A;  . INSERTION MORSELIZED BONE ALLOGRAFT FOR SPINE SURGERY N/A 12/13/2015   Procedure: ALLOGRAFT, MORSELIZED, OR PLACEMENT OF OSTEOPROMOTIVE MATERIAL, FOR SPINE SURGERY ONLY (LIST IN ADDITION TO PRIMARY PROCEDURE) DBM Xemplifi;  Surgeon: Unice Gustav Robinette Reno, MD;  Location: [facility];  Service: Orthopedics;  Laterality: N/A;  . MICROSURGERY N/A 12/13/2015   Procedure: MICROSURGICAL TECHNIQUES, REQUIRING USE OF OPERATING MICROSCOPE (LIST IN ADDITION TO PRIMARY PROCEDURE);  Surgeon: Unice Gustav Robinette Reno, MD;  Location: [facility];  Service: Orthopedics;  Laterality: N/A;  . ARTHRODESIS POSTERIOR CERVICLE SPINE N/A 06/17/2018   Procedure: Airo 3. ARTHRODESIS, POSTERIOR TECHNIQUE, ATLAS-AXIS (C1-C2)  C1-C2;  Surgeon: Robinette Reno, Unice Gustav, MD;  Location: [facility];  Service: Orthopedics;  Laterality: N/A;  . POSTERIOR THORACIC SPINE FUSION/ARTHRODESIS ONE LEVEL N/A 06/17/2018   Procedure: ARTHRODESIS, POSTERIOR OR POSTEROLATERAL TECHNIQUE, SINGLE LEVEL; CERVICAL BELOW C2 SEGMENT  C2-C3;  Surgeon: Robinette Reno, Unice Gustav, MD;  Location: [facility];  Service: Orthopedics;   Laterality: N/A;  . LAMINECTOMY POSTERIOR CERVICLE DECOMP W/FACETECTOMY & FORAMINOTOMY N/A 06/17/2018   Procedure: LAMINECTOMY POSTERIOR CERVICAL DECOMP W/FACETECTOMY & FORAMINOTOMY C1;  Surgeon: Robinette Reno, Unice Gustav, MD;  Location: [facility];  Service: Orthopedics;  Laterality: N/A;  . LAMINECTOMY POSTERIOR CERVICLE DECOMP W/FACETECTOMY & FORAMINOTOMY N/A 06/17/2018   Procedure: LAMINECTOMY, FACETECTOMY AND FORAMINOTOMY, SINGLE VERTEBRAL SEGMENT; EACH ADDITIONAL SEGMENT, CERVICAL, THORACIC, OR LUMBAR (LIST IN ADDITION TO PRIMARY PROCEDURE) C2;  Surgeon: Robinette Farley, Unice Merck, MD;  Location: [facility];  Service: Orthopedics;  Laterality: N/A;  . REMOVAL HARDWARE BACK N/A 06/17/2018   Procedure: REMOVAL OF IMPLANT; BACK, DEEP (EG, BURIED WIRE, PIN, SCREW, METAL BAND, NAIL, ROD OR PLATE);  Surgeon: Robinette Farley, Unice Merck, MD;  Location: [facility];  Service: Orthopedics;  Laterality: N/A;  . INSTRUMENTATION POSTERIOR SPINE 7 TO 12 VERTEBRAL SEGMENTS N/A 06/17/2018   Procedure: POSTERIOR SEGMENTAL INSTRUMENTATION (EG, PEDICLE FIXATION, DUAL RODS WITH MULTIPLE HOOKS AND SUBLAMINAR WIRES); 3 TO 6 VERTEBRAL SEGMENTS (LIST IN ADDITION TO PRIMARY PROCEDURE);  Surgeon: Robinette Farley, Unice Merck, MD;  Location: [facility];  Service: Orthopedics;  Laterality: N/A;  . INSERTION MORSELIZED BONE ALLOGRAFT FOR SPINE SURGERY N/A 06/17/2018   Procedure: ALLOGRAFT, MORSELIZED, OR PLACEMENT OF OSTEOPROMOTIVE MATERIAL, FOR SPINE SURGERY ONLY (LIST IN ADDITION TO PRIMARY PROCEDURE);  Surgeon: Robinette Farley, Unice Merck, MD;  Location: [facility];  Service: Orthopedics;  Laterality: N/A;  . AUTOGRAFT OBTAINED SAME INCISION FOR SPINE SURGERY N/A 06/17/2018   Procedure: AUTOGRAFT FOR SPINE SURGERY ONLY (INCL HARVESTING GRAFT); LOCAL (EG, RIBS, SPINOUS PROCESS, OR LAMINAR FRAGMENT) OBTAINED FROM SAME INCISION (LIST IN ADDITION TO PRIMARY PROCEDURE);   Surgeon: Robinette Farley, Unice Merck, MD;  Location: [facility];  Service: Orthopedics;  Laterality: N/A;  . MICROSURGERY N/A 06/17/2018   Procedure: MICROSURGICAL TECHNIQUES, REQUIRING USE OF OPERATING MICROSCOPE (LIST IN ADDITION TO PRIMARY PROCEDURE);  Surgeon: Robinette Farley, Unice Merck, MD;  Location: [facility];  Service: Orthopedics;  Laterality: N/A;  . LUMB SPINE FUSION COMBINED N/A 07/11/2023   Procedure: ARTHRODESIS, POSTERIOR INTERBODY TECHNIQUE, INCL LAMINECTOMY AND/OR DISCECTOMY TO PREPARE INTERSPACE, SINGLE INTERSPACE; EACH ADDITIONAL INTERSPACE (LIST IN ADDITION TO CODE FOR PRIMARY PROCEDURE) C4-T4, laminectomies C6, C7, T1, T2;  Surgeon: Eppie Vicenta Bare, MD;  Location: [facility];  Service: Orthopedics;  Laterality: N/A;  . ARTHRODESIS POSTERIOR LUMBAR SPINE W/LAMINECTOMY/DISCECTOMY N/A 07/11/2023   Procedure: ARTHRODESIS, POSTERIOR INTERBODY TECHNIQUE, INCLUDING LAMINECTOMY AND/OR DISCECTOMY TO PREPARE INTERSPACE (OTHER THAN FOR DECOMPRESSION), SINGLE INTERSPACE; LUMBAR, STEREOTACTIC WITH BRAINLAB;  Surgeon: Eppie Vicenta Bare, MD;  Location: [facility];  Service: Orthopedics;  Laterality: N/A;  . APPENDECTOMY    . BACK SURGERY     4 low back surgeries, 2 cervical surgeries Rods placed last surgery 2005/2006  . CORONARY ANGIOPLASTY WITH STENT PLACEMENT    . CORONARY ARTERY BYPASS GRAFT    . FRACTURE SURGERY Right    ankle  . HERNIA REPAIR  Infant  . KNEE ARTHROSCOPY    . LENS EYE SURGERY    . SKIN CANCER EXCISION    . SPINE SURGERY  03/15/2015-12/13/2015-04/17/2014  . TONSILLECTOMY      FAMILY HISTORY Family History  Problem Relation Name Age of Onset  . Diabetes Mother Wenceslao Anon   . Colon cancer Mother Wenceslao Anon        8024  . Heart disease Mother Wenceslao Anon   . Hepatitis Mother Wenceslao Anon        50's  . High blood pressure (Hypertension) Mother Wenceslao Anon   . Kidney disease Mother  Wenceslao Anon   . Stroke Mother Wenceslao Anon   . Ulcers Mother Wenceslao Anon   .  Cancer Mother Florine Blinda        Colon Cancer  . Colon cancer Father Trentin Knappenberger        70's  . Cancer Father [place]        Colon Cancer  . Dementia Father [place]   . Heart disease Brother    . Heart disease Daughter Jori Retort   . High blood pressure (Hypertension) Daughter Jori Retort   . Stroke Daughter Jori Retort        2009 &2017  . High blood pressure (Hypertension) Son [employer]   . Diabetes Paternal Aunt unknown   . Heart disease Brother Anees [place].        1/2 Brother/Currently  . High blood pressure (Hypertension) Brother Lamar Jacobs [place].   . Sleep disorder Brother Lamar Jacobs [place].   SABRA Anesthesia problems Neg Hx    . Malignant hyperthermia Neg Hx      SOCIAL HISTORY Social History   Socioeconomic History  . Marital status: Widowed  Tobacco Use  . Smoking status: Former    Types: Cigars    Passive exposure: Past  . Smokeless tobacco: Former    Types: Chew  . Tobacco comments:    only smoked cigars for few years early age  Vaping Use  . Vaping status: Never Used  Substance and Sexual Activity  . Alcohol use: Not Currently  . Drug use: No  . Sexual activity: Not Currently    Partners: Female   Social Drivers of Health   Financial Resource Strain: Low Risk  (07/14/2023)   Overall Financial Resource Strain (CARDIA)   . Difficulty of Paying Living Expenses: Not hard at all  Food Insecurity: No Food Insecurity (07/14/2023)   Hunger Vital Sign   . Worried About Programme researcher, broadcasting/film/video in the Last Year: Never true   . Ran Out of Food in the Last Year: Never true  Transportation Needs: Unmet Transportation Needs (07/14/2023)   PRAPARE - Transportation   . Lack of Transportation (Medical): Yes   . Lack of Transportation (Non-Medical): Patient declined  Housing Stability: Low Risk  (07/14/2023)   Housing  Stability Vital Sign   . Unable to Pay for Housing in the Last Year: No   . Number of Times Moved in the Last Year: 0   . Homeless in the Last Year: No    HOME MEDICATIONS Prior to Admission medications   Medication Sig Taking? Last Dose  acetaminophen  (TYLENOL ) 325 MG tablet Take 3 tablets (975 mg total) by mouth 3 (three) times a day for 10 days Yes   amLODIPine  (NORVASC ) 5 MG tablet Take 1 tablet (5 mg total) by mouth once daily Yes   baclofen (LIORESAL) 10 MG tablet TAKE 1 TABLET (10 MG TOTAL) BY MOUTH 3 (THREE) TIMES DAILY AS NEEDED (BACK PAIN) FOR UP TO 30 DAYS Yes   BASAGLAR  KWIKPEN U-100 INSULIN  pen injector (concentration 100 units/mL) Inject 32 units subcutaneously once daily. Yes   benazepriL  (LOTENSIN ) 40 MG tablet Take 1 tablet (40 mg total) by mouth once daily Yes   brimonidine  (ALPHAGAN ) 0.2 % ophthalmic solution Place 1 drop into both eyes 3 (three) times daily Yes   buPROPion  (WELLBUTRIN  XL) 300 MG XL tablet TAKE 1 TABLET BY MOUTH EVERY DAY Yes   compress.stocking,knee,reg,med Misc Open toe, 30-40 compression stockings. Small for left leg and medium for right Yes   dabigatran  (PRADAXA ) 75 mg capsule Take 1 capsule (75 mg total) by mouth 2 (two)  times daily REPLACES ELIQUIS  Yes 07/10/2023 Morning  empagliflozin (JARDIANCE) 25 mg tablet Take 1 tablet (25 mg total) by mouth once daily Yes   escitalopram  oxalate (LEXAPRO ) 10 MG tablet TAKE 1 AND 1/2 TABLETS BY MOUTH ONCE DAILY Yes   finasteride  (PROSCAR ) 5 mg tablet TAKE 1 TABLET BY MOUTH EVERY DAY Yes   fluticasone  propionate (FLONASE ) 50 mcg/actuation nasal spray SPRAY 2 SPRAYS INTO EACH NOSTRIL EVERY DAY Yes   FUROsemide  (LASIX ) 20 MG tablet Take 1 tablet (20 mg total) by mouth 2 (two) times daily Yes   gabapentin  (NEURONTIN ) 300 MG capsule Take 1 capsule (300 mg total) by mouth 2 (two) times daily Yes   guaiFENesin (MUCINEX) 100 mg/5 mL solution Take 10 mLs (200 mg total) by mouth every 4 (four) hours as needed for Congestion  for up to 10 days Yes   insulin  ASPART (NOVOLOG  FLEXPEN) pen injector (concentration 100 units/mL) Inject 24 units subcutaneously three times daily with meals. Use correction scale as directed by [facility] Endocrinology. Max total daily dose 110 units. Yes   polyethylene glycol (MIRALAX) packet Take 1 packet (17 g total) by mouth once daily as needed for Constipation Mix in 4-8ounces of fluid prior to taking. Yes   pravastatin  (PRAVACHOL ) 40 MG tablet Take 1 tablet (40 mg total) by mouth once daily Yes   riluzole (RILUTEK) 50 mg tablet Take 1 tablet (50 mg total) by mouth every 12 (twelve) hours for 5 days Yes   sennosides-docusate (SENOKOT-S) 8.6-50 mg tablet Take 2 tablets by mouth 2 (two) times daily Yes   spironolactone (ALDACTONE) 25 MG tablet Take 25 mg by mouth once daily Yes   tamsulosin  (FLOMAX ) 0.4 mg capsule TAKE 1 CAPSULE (0.4 MG TOTAL) BY MOUTH ONCE DAILY. TAKE 30 MINUTES AFTER SAME MEAL EACH DAY. Yes   traMADoL  (ULTRAM ) 50 mg tablet Take 0.5-1 tablets (25-50 mg total) by mouth every 4 (four) hours as needed for up to 7 days Yes   acetaminophen  (TYLENOL ) 325 MG tablet Take by mouth    aspirin  81 MG EC tablet Take 81 mg by mouth once daily.    blood glucose diagnostic (ONETOUCH ULTRA TEST) test strip 3 (three) times daily    blood-glucose meter (ONETOUCH ULTRA2 METER) Misc as directed (Daily)    calcium carbonate (TUMS E-X) 300 mg (750 mg) chewable tablet Take by mouth    cholecalciferol  (VITAMIN D3) 1,000 unit capsule 1 tab by mouth daily    cyanocobalamin (VITAMIN B12) 1000 MCG tablet Take 1,000 mcg by mouth once daily    DEXCOM G7 SENSOR Devi Use 1 Device every 10 (ten) days    DOCOSAHEXANOIC ACID ORAL Take 2 capsules by mouth 2 (two) times daily      dulaglutide (TRULICITY) 4.5 mg/0.5 mL subcutaneous pen injector Inject 0.5 mLs (4.5 mg total) subcutaneously once a week    glucagon (BAQSIMI) 3 mg/actuation nasal spray 3 mg once    hydrocortisone 2.5 % cream APPLY TO AFFECTED SCALY AREAS  ON FACE TWICE DAILY AS NEEDED    ketoconazole (NIZORAL) 2 % shampoo Apply topically once daily as needed       magnesium oxide (MAG-OX) 400 mg (241.3 mg magnesium) tablet Take 400 mg by mouth once daily    multivitamin tablet Take 1 tablet by mouth once daily    pantoprazole  (PROTONIX ) 40 MG DR tablet take 1 tablet by mouth every day    pen needle, diabetic (BD ULTRA-FINE NANO PEN NEEDLE) 32 gauge x 5/32 Ndle Use daily as  directed      ALLERGIES Allergies  Allergen Reactions  . Oxycontin  [Oxycodone ] Other (See Comments)    Other Reaction: Other reaction, hallucinations   . Latex, Natural Rubber Hives  . Prednisone Other (See Comments)    Memory loss     Recent Labs Within the last 72 hours  Lab Component 07/20/23 0701 07/19/23 0454  SODIUM 137 135  POTASSIUM 5.2* 5.1*  CHLORIDE 109* 106  GLUCOSE 119 146*  BUN 65* 67*  CREATININE 1.6* 1.6*  CALCIUM 8.7 8.4*    BP (!) 147/57 (BP Location: Left upper arm, Patient Position: Lying)   Pulse 64   Temp 36.7 C (98.1 F) (Oral)   Resp 17   Ht 167.6 cm (5' 6) Comment: per patient  Wt 96.3 kg (212 lb 4.9 oz)   SpO2 94%   BMI 34.27 kg/m  PHYSICAL EXAM  Mental Status: Alert and oriented x 3  Speech: fluent, names, repeats Cranial Nerves: PERRL, EOMI, VFF, face symmetric, hearing grossly intact, tongue midline HEENT:  Normocephalic.  Neck:  Supple.   Lungs:  No accessory muscle usage Speech clear Abdomen:   Soft, nontender, nondistended.   Wound: clean, dry, and intact.  Cervical Right Left  Elbow Flexion/Shoulder Abduction (C5) 5/5 5/5  Wrist Extension (C6) 4-/5 4-/5  Elbow Extension (C7) 4-/5 4-/5  Finger Flexion (C8)  5/5 5/5  Little Finger Abd (T1)  5/5 5/5  Thoraco-Lumbar Right  Left  Hip Flexion (L2) 5/5 5/5  Knee Extension (L3)  5/5 5/5  Ankle Dorsi Flexion (L4) 2/5 2/5  Long Toe Extension (L5) 0/5 0/5  Ankle Plantar Flexion (S1)      HOSPITAL COURSE:  Mr. Cervi  was taken to the operating room on  07/10/2023 and underwent C2-T4 decompression/revision fusion.  There were no intraoperative complications and patient tolerated the procedure well. Postoperatively patient returned to the perioperative care unit until and remained hemodynamically stable hence was cleared by anesthesia. Patient was transferred to   Neurosurgery ICU for MAP goals and was started on riluzole protocol. He required pressors to maintain adequate blood pressure. He was closely monitored with serial neuro exams and vital sign checks and remained hemodynamically stable. Most of his home antihypertensives and diuretics (amlodipine  5 mg, benazepril  40 mg, lasix  20 mg BID, spironolactone 25 mg) were held during hospital stay. MAP goals were completed and he was transferred to the stepdown floor with no issues.   On 5/4, his hgb was 7.7 and he was transfused 1unit PRBC for acute blood loss anemia with appropriate response. During hospital stay, his blood glucose were high and endocrine was consulted for management. He was on scheduled and sliding scale insulin  and this was titrated based on blood glucose control with improvement. Diet was advanced as tolerated with no acute issues.  Mr. Esterline immediate post-operative pain was managed with oral with IV breakthrough and during the course of hospital stay, this was gradually transitioned to oral pain medications with good pain control.  Patient was given a prophylactic bowel regimen of senokot and miralax to help avoid constipation.    CKD 3b, his baseline Cr ~1.6-1.9, was 2.0 on admission. Diuretics were held and BUN/Cr trends were monitored closely and intake and output recorded with no acute issues. Lasix  and spironolactone can be resumed in outpatient setting as needed. Foley was removed and patient had urinary retention requiring straight catheterization and subsequently foley was re-inserted for persistent retention. Drain was pulled after output was minimal with no complications.  Post-operative films were done that revealed good alignment of the hardware.  Activity was advanced as tolerated and patient  was evaluated by physical and occupational therapy due to mobility and safety issues patient was recommended to be discharged to skilled nursing facility.  Pharmacologic DVT prophylaxis  was held most of the hospital stay due to bleeding risk. Apixaban  was resumed on 07/18/23 and patient remained on therapy throughout hospital stay. Mechanical DVT prophylaxis with sequential compression devices was utilized while the patient was in bed.   No signs or symptoms of deep vein thrombosis were noted.   On   07/21/23, Mr. Achord was hemodynamically stable, was tolerating diet and had good pain control. Patient was deemed safe from a medical perspective to be discharged to skilled nursing facility.and patient was in agreement with the plan. Patient and family were given written and verbal discharge instructions regarding patient's postoperative care, restrictions and follow up.  All questions were answered and the patient and family expressed understanding.   In patient's after visit summary contact information was provided if the patient were to have any questions or concerns. he is being discharged with the following recommendations.   DISCHARGE MEDICATIONS   Medication List     START taking these medications    compress.stocking,knee,reg,med Misc Open toe, 30-40 compression stockings. Small for left leg and medium for right   guaiFENesin 100 mg/5 mL solution Commonly known as: MUCINEX Take 10 mLs (200 mg total) by mouth every 4 (four) hours as needed for Congestion for up to 10 days   polyethylene glycol packet Commonly known as: MIRALAX Take 1 packet (17 g total) by mouth once daily as needed for Constipation Mix in 4-8ounces of fluid prior to taking.   riluzole 50 mg tablet Commonly known as: RILUTEK Take 1 tablet (50 mg total) by mouth every 12 (twelve) hours for 5  days   sennosides-docusate 8.6-50 mg tablet Commonly known as: SENOKOT-S Take 2 tablets by mouth 2 (two) times daily   traMADoL  50 mg tablet Commonly known as: ULTRAM  Take 0.5-1 tablets (25-50 mg total) by mouth every 4 (four) hours as needed for up to 7 days       CHANGE how you take these medications    acetaminophen  325 MG tablet Commonly known as: TYLENOL  Take 3 tablets (975 mg total) by mouth 3 (three) times a day for 10 days What changed:  how much to take when to take this   BASAGLAR  KWIKPEN U-100 INSULIN  pen injector (concentration 100 units/mL) Generic drug: insulin  GLARGINE Inject 32 units subcutaneously once daily. What changed: additional instructions   gabapentin  300 MG capsule Commonly known as: NEURONTIN  Take 1 capsule (300 mg total) by mouth 2 (two) times daily What changed:  when to take this reasons to take this   insulin  ASPART pen injector (concentration 100 units/mL) Commonly known as: NovoLOG  FLEXPEN Inject 24 units subcutaneously three times daily with meals. Use correction scale as directed by [facility] End[facility]in[facility]. Max total daily dose 110 units. What changed: additional instructions       CONTINUE taking these medications    amLODIPine  5 MG tablet Commonly known as: NORVASC  Take 1 tablet (5 mg total) by mouth once daily   aspirin  81 MG EC tablet   baclofen 10 MG tablet Commonly known as: LIORESAL TAKE 1 TABLET (10 MG TOTAL) BY MOUTH 3 (THREE) TIMES DAILY AS NEEDED (BACK PAIN) FOR UP TO 30 DAYS   benazepriL  40 MG tablet Commonly known as: LOTENSIN  Take 1  tablet (40 mg total) by mouth once daily   blood-glucose meter Misc Commonly known as: ONETOUCH ULTRA2 METER as directed (Daily)   brimonidine  0.2 % ophthalmic solution Commonly known as: ALPHAGAN  Place 1 drop into both eyes 3 (three) times daily   buPROPion  300 MG XL tablet Commonly known as: WELLBUTRIN  XL TAKE 1 TABLET BY MOUTH EVERY DAY   calcium carbonate 300 mg (750 mg)  chewable tablet Commonly known as: TUMS E-X   cholecalciferol  1000 unit capsule Commonly known as: VITAMIN D3 1 tab by mouth daily   cyanocobalamin 1000 MCG tablet Commonly known as: VITAMIN B12   dabigatran  75 mg capsule Commonly known as: PRADAXA  Take 1 capsule (75 mg total) by mouth 2 (two) times daily REPLACES ELIQUIS    DEXCOM G7 SENSOR Devi Use 1 Device every 10 (ten) days   DOCOSAHEXANOIC ACID ORAL   escitalopram  oxalate 10 MG tablet Commonly known as: LEXAPRO  TAKE 1 AND 1/2 TABLETS BY MOUTH ONCE DAILY   finasteride  5 mg tablet Commonly known as: PROSCAR  TAKE 1 TABLET BY MOUTH EVERY DAY   fluticasone  propionate 50 mcg/actuation nasal spray Commonly known as: FLONASE  SPRAY 2 SPRAYS INTO EACH NOSTRIL EVERY DAY   glucagon 3 mg/actuation nasal spray Commonly known as: BAQSIMI   hydrocortisone 2.5 % cream   ketoconazole 2 % shampoo Commonly known as: NIZORAL   magnesium oxide 400 mg (241.3 mg magnesium) tablet Commonly known as: MAG-OX   multivitamin tablet   ONETOUCH ULTRA TEST test strip Generic drug: blood glucose diagnostic 3 (three) times daily   pantoprazole  40 MG DR tablet Commonly known as: PROTONIX  take 1 tablet by mouth every day   pen needle, diabetic 32 gauge x 5/32 Ndle Commonly known as: BD ULTRA-FINE NANO PEN NEEDLE Use daily as directed   pravastatin  40 MG tablet Commonly known as: PRAVACHOL  Take 1 tablet (40 mg total) by mouth once daily   tamsulosin  0.4 mg capsule Commonly known as: FLOMAX  TAKE 1 CAPSULE (0.4 MG TOTAL) BY MOUTH ONCE DAILY. TAKE 30 MINUTES AFTER SAME MEAL EACH DAY.       STOP taking these medications    dulaglutide 4.5 mg/0.5 mL subcutaneous pen injector Commonly known as: TRULICITY   empagliflozin 25 mg tablet Commonly known as: JARDIANCE   FUROsemide  20 MG tablet Commonly known as: LASIX    spironolactone 25 MG tablet Commonly known as: ALDACTONE         Where to Get Your Medications     These  medications were sent to [id number] [place], [facility] [address]  [address] [place] [address]    Phone: [phone]  BASAGLAR  KWIKPEN U-100 INSULIN  pen injector (concentration 100 units/mL) insulin  ASPART pen injector (concentration 100 units/mL)    You can get these medications from any pharmacy   Bring a paper prescription for each of these medications traMADoL  50 mg tablet    Information about where to get these medications is not yet available   Ask your nurse or doctor about these medications acetaminophen  325 MG tablet gabapentin  300 MG capsule guaiFENesin 100 mg/5 mL solution polyethylene glycol packet riluzole 50 mg tablet sennosides-docusate 8.6-50 mg tablet     DISCHARGE INSTRUCTIONS SPINE SURGERY DISCHARGE INSTRUCTIONS  Reason for hospitalization:  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 ACTIVITY INSTRUCTIONS Light activity.  No lifting anything greater than 5 pounds.  No twisting or bending.  No overhead activities. You may drive only after you are off narcotics and you have been cleared by your surgeon's office at your 6 week follow-up appointment.  Continue to wear your [place]  brace as ordered until you follow up with your surgeon.  2. MEDICATION INSTRUCTIONS:       A.  If you have Spinal Fusion surgery: Do not take NSAIDs (ibuprofen/motrin,  naproxen/naprosyn,  celebrex, meloxicam/mobic, indomethacin, etc) for at  least 6 weeks, until cleared by your surgeon.        B. Medications to Prevent Blood Clots:  To prevent blood clots, make sure you continue to work with PT/OT.  Do NOT take any over the counter NSAIDs such as  Advil, Ibuprofen, Excedrin, Motrin, Aleve, or other Aspirin  containing products.        C. Medications to Reduce your Pain:  Per [place]  law, we are only allowed to prescribe one week of pain medications after surgery. Please follow up with your surgeon's office or regular PCP/Pain Provider if in need of refills. No driving while under the influence of such medications. Take these medications only as directed. If you need refills, please contact your surgeon's office.   If you are on one of the following short acting pain medications- Oxycodone , Morphine, Hydromorphone, Hydrocodone  or tramadol , please decrease your use over the next 7days.  Decrease from taking 1-2 tabs every 4 hours, to 1-2 tabs every 6 hours, and then take every 8 hours as needed for pain.  This will help you to wean off these pain medications.  If you are on a long acting pain medicine, such as OxyContin  or MS Contin, take as scheduled and stop when instructed to do so.  Continue taking Acetaminophen  (Tylenol ) as recommended. Do not exceed 4,000 mg of Acetaminophen  (Tylenol ) in any 24 hour period.  D. Medication for constipation: Continue taking some type of stool softener until your bowel movements become more normal. Examples: miralax, milk of magnesia, senokot, colace, etc. All of these medications are available over-the-counter.   E. For pain medication refills: Call Dr. Eppie [phone]. Any refill request need to be made to your OUTPATIENT provider (surgeon, PA, or NP) - not to the provider that discharges you.  If there is no answer, leave a message that includes your name, date of birth, medication you need refilled, and working phone number to return your call. Medications will be refilled between 8am-4:30pm Mon-Fri only. Per the Drug Enforcement Agency's requirement for narcotic prescriptions must be picked up in person at the office with a photo ID (you may send a family member/friend with a photo  ID). Medications will NOT be refilled after hours. The Orthopaedic resident on call cannot refill prescription pain medications. Narcotic pain medications are a controlled substance and are monitored closely by a Product manager. Therefore, lost/stolen/spilled scripts cannot be replaced prior to the expected completion date even with a police report. It is also important not to take more than prescribed as it cannot be refilled prior to the expected completion date.  How much pain will I have after surgery?  Having pain after spine surgery is normal.  The first couple of weeks after your surgery are often the hardest. Your pain should get better as you heal. You can expect  to feel better after simple spine surgery in about 1 month. For more complex surgeries, it may take 2 months or longer to feel better and be able to return to normal activities. It usually takes several months to enjoy the improved function and other benefits of surgery. What will my healthcare team do? Our goal is to make you comfortable enough to do the things that are needed for recovery, such as eating, drinking, sleeping, and moving around. We will partner with you to develop a plan to help you manage your pain. Remember you will have some pain or discomfort.   Your personal pain plan will include non-medicine and medicine options. Non-Medicine Options Physical therapy   Heat   Ice   Relaxation   Medicine Options Acetaminophen  (Tylenol ) In some cases non-steroidal (Celebrex, meloxicam, mobic) Methocarbamol or zanaflex or flexeril or baclofen (muscle relaxers) Gabapentin  (nerve pain medication) Opioids/Narcotics (oxycodone , dilaudid, morphine, tramadol , hydrocodone )  If you are taking opioid pain medicine, you should take less medicine as your body heals. Aim to be off all opioid pain medicine in 4 weeks.   Your safety is our goal. We will be careful about increasing your pain medicine.  Too much pain  medicine can become dangerous. Surgery is never pain free. We will partner with you to manage your pain. When should I call for help? Call your provider if: . Your pain gets worse or you have new pain symptoms  . Your pain is not managed by your pain medicines . Your surgical wound has drainage or redness . You run out of medicine and need refills  Who should I call for help? For questions related to your surgery, call your surgical provider. Your surgical provider's contact information is listed on your hospital After Visit Summary (AVS). If you cannot reach your surgical provider, call the Surgical Nurse Triage Line at [phone] - option 5 (open 8:00 am to 4:00 pm Monday through Friday,except holidays). After hours, call the main hospital operator at [phone] and request to speak with the resident on-call. The resident on-call cannot provide medicine refills. Request any medicine refills that you need by calling during business hours or by sending your doctor a message using [facility] MyChart. 3. Wound / Incision CARE: Wash your hands with soap and water before and after touching your incision or dressing.   Keep the incision clean and dry.   Inspect the wound daily.  A small amount of clear or yellow fluid is not unusual.  Itching around the wound can be a sign of healing.  Contact surgeon's office if you have signs of infection, such as fever over 101.5, increasing redness, drainage, pus, odor, or increased swelling, increased, new, or persistent drainage from your incision site.  If there is drainage from the incision you must keep it covered until the drainage stops.You may cover the incision with dry gauze and Tegaderm or a water proof dressing and change daily.   If there is no drainage from the incision you can keep it uncovered and open to air. You may shower being careful to avoid falling and let soap and water run through, do not scrub incision incision and then pat dry afterwards..  Do  not soak in a bath tub, hot tub, pool, lake, or other body of water until after your 6 week follow-up appointment, and your incision is completely dry and healed.  Sutures or staples on your incision should be removed in 7-10 days. . These may be removed in  the skilled nursing facility  DO NOT SMOKE.  If you smoke, please stop, as continuing to do so will lead to wound complications and poor healing.  Notify your doctor for any of the following:  Nausea, vomiting, or chills Numbness or weakness Signs of infection such as fever over 101.5 F (38.5 C), increasing                         redness, drainage, pus, or odor from surgical incision or increased swelling Increased pain not relieved by rest, pain medication and elevation Bowel or bladder dysfunction   4. Paperwork:  Forms such as insurance, FMLA, and short-term disability will be processed by the outpatient clinic for a one time fee of $25, payable by cash or check at the [facility].  Forms may take 5 business days to be completed.  You may fax forms to [phone] or bring them to the [facility].  Please fill out your portion of the form and include your name, date of birth, medical record number, the reason for the paperwork, and instructions for returning the paperwork to you.  If your paperwork comes from your employer, you may have them fax it directly to the office.  5. Follow up Appointment: Your follow up appointment has not been made.  An appointment has been requested for you but if you do not hear from [facility] within 1week after hospital discharge, please call [phone] to make a follow up appointment in 6 weeks for regular follow up. Please arrive on time for your appointment. Late arrivals will be rescheduled.   Contact information  For medical emergencies dial 911 or go to your nearest emergency room.  For non-emergent questions or concerns, contact clinic during business hours at Dr. Claris office at  [phone].  The clinic is open 8-4:30 M-F by appointment only.  You may leave a voice mail at any time.  Someone will return your call within 24 hours.  After hours and weekends: Contact the Orthopaedic resident on call at [phone].  Ask the operator to page the Orthopaedic resident on-call.  The Orthopaedic resident on-call cannot refill prescription pain medications.   Please see below for scheduled appointments:  Future Appointments  Date Time Provider Department Center  10/23/2023  8:00 AM Clarinda Ozell LABOR, MD [facility] [place]  11/17/2023 11:00 AM Neill Krystal Fallow, DPM [facility]  03/05/2024 10:00 AM Dimsdale, Isaiah Feather, NP [facility] 2 [facility]      DIABETES HOME CARE INSTRUCTIONS Lantus  (glargine) & Novolog      Blood Sugar Testing and Targets  Test your blood sugar: four times daily, before meals and at bedtime  Also test any time you feel shaky, sweaty, dizzy or like you may be having a low blood sugar.   Your blood sugar targets are: 80 - 130 before meals, and not more than 200.  If you have blood sugars less than 70 or more than 200 on a regular basis, please call your provider for evaluation.   Take your glucose meter with you to all of your doctor or provider appointments.   Please STOP taking your empagliflozin (Jardiance) and Trulicity. You may discuss with your primary care provider starting these medications at a later date once you are discharged from skilled nursing facility.     Insulin   You are going home on 2 different types of insulin :  Lantus  (glargine) - This is a long-acting insulin  that is  taken once a day, at the same time every day, even if you are not eating.  Novolog  (aspart) - This is a rapid-acting insulin  that is taken just before each meal, usually 3 times a day.  It is also used in the Correction Scale for Extra Insulin  if your blood sugar is more than 200.  Scheduled Insulin  Doses When to Take Insulin  Type Dose  Additional  Just before breakfast Novolog  24 units Plus any extra Novolog  from the Correction Scale, if needed  Just before lunch Novolog  24 units Plus any extra Novolog  from the Correction Scale, if needed  Just before dinner Novolog  24 units Plus any extra Novolog  from the Correction Scale, if needed  Take at the same time every day Lantus  (glargine) 32 units Do NOT add any extra insulin     Correction Scale for Extra Insulin   When to Take Blood Sugar Additional Units Novolog   Just before each meal  70 - 200 NO extra Novolog    201 - 250 Take 2 extra units Novolog    251 - 300 Take 4 extra units Novolog    301 - 350 Take 6 extra units Novolog    351 - 400 Take 8 extra units Novolog    Greater than 400 Take 10 extra units Novolog  and call your provider   Low Blood Sugar  If your blood sugar is less than 70 at any time, treat with a fast-acting sugar, such as:   cup of juice (orange, apple, cranberry) 4 glucose tablets (available over the counter)  can of regular (not diet) soda   3 sugar packets  Recheck your blood sugar in 15 - 20 minutes, and if it has not gone up to at least 80, repeat the treatment. If your next meal is more than an hour away, after you have treated with simple sugar, have a snack like cheese and crackers so you don't go low again.  Be sure to carry a source of fast-acting sugar with you at all times, such as a juice box, glucose tablets, or sugar packets.     What to do if you are skipping a meal or not eating  If you are not going to eat a meal for any reason, do not take your scheduled Novolog  insulin  dose for that meal. However, if your blood sugar is greater than 200, DO take your Correction Scale Novolog  insulin .  Always take your full dose of Lantus  (glargine), even if you are not eating or able to eat.   Following up with your diabetes care  You should see your regular doctor or provider within 4 weeks of discharge from the hospital to review your  blood sugar control.   If you have any URGENT questions about your diabetes or blood sugars before you see your regular provider, please call our Endocrine triage nurse line at [phone] between 8:00-4:00 on weekdays. If our office is closed, you can call the Hospital Operator at [phone] and ask for the Outpatient Endocrinology on call provider to be paged. Please use this only for urgent issues not able to be taken care of during office hours. If you are having a medical emergency, please call 911.  Please see below for scheduled appointments: Future Appointments  Date Time Provider Department Center  10/23/2023  8:00 AM Blazing, Ozell LABOR, MD [facility] [place]  11/17/2023 11:00 AM Neill Krystal Fallow, DPM [facility]  03/05/2024 10:00 AM Dimsdale, Isaiah Feather, NP [facility] [place]      Sherol  Virgil, NP Spine Surgery

## 2023-09-26 ENCOUNTER — Encounter: Payer: Self-pay | Admitting: Advanced Practice Midwife

## 2023-12-03 NOTE — Progress Notes (Signed)
 12/03/2023      Derek Andersen has been APPROVED for free drug from manufacturer [facility] : [facility]) supported program for Humalog, Basaglar , & Trulicity.      Free drug will be provided by [employer]. Medication will be shipped directly to the patient from [facility] [phone]). Enrollment is valid until 03/10/2024.  Thank you, Benton Samples, CPhT II Medication Access Specialist [facility] Patient Assistance Program Endocrine Clinics [address] [place] Phone: [phone] Fax: [phone] Email: [email or web address]

## 2023-12-20 ENCOUNTER — Other Ambulatory Visit: Payer: Self-pay

## 2023-12-20 ENCOUNTER — Encounter: Payer: Self-pay | Admitting: Emergency Medicine

## 2023-12-20 ENCOUNTER — Emergency Department

## 2023-12-20 ENCOUNTER — Emergency Department
Admission: EM | Admit: 2023-12-20 | Discharge: 2023-12-20 | Disposition: A | Discharge status: Home / Self Care | Attending: Emergency Medicine | Admitting: Emergency Medicine

## 2023-12-20 DIAGNOSIS — E1122 Type 2 diabetes mellitus with diabetic chronic kidney disease: Secondary | ICD-10-CM | POA: Diagnosis not present

## 2023-12-20 DIAGNOSIS — Y92121 Bathroom in nursing home as the place of occurrence of the external cause: Secondary | ICD-10-CM | POA: Diagnosis not present

## 2023-12-20 DIAGNOSIS — I4891 Unspecified atrial fibrillation: Secondary | ICD-10-CM | POA: Diagnosis not present

## 2023-12-20 DIAGNOSIS — E041 Nontoxic single thyroid nodule: Secondary | ICD-10-CM | POA: Insufficient documentation

## 2023-12-20 DIAGNOSIS — W182XXA Fall in (into) shower or empty bathtub, initial encounter: Secondary | ICD-10-CM | POA: Diagnosis not present

## 2023-12-20 DIAGNOSIS — D649 Anemia, unspecified: Secondary | ICD-10-CM | POA: Diagnosis not present

## 2023-12-20 DIAGNOSIS — Z7901 Long term (current) use of anticoagulants: Secondary | ICD-10-CM | POA: Insufficient documentation

## 2023-12-20 DIAGNOSIS — I2510 Atherosclerotic heart disease of native coronary artery without angina pectoris: Secondary | ICD-10-CM | POA: Insufficient documentation

## 2023-12-20 DIAGNOSIS — N189 Chronic kidney disease, unspecified: Secondary | ICD-10-CM | POA: Insufficient documentation

## 2023-12-20 DIAGNOSIS — S0990XA Unspecified injury of head, initial encounter: Secondary | ICD-10-CM | POA: Diagnosis present

## 2023-12-20 DIAGNOSIS — E875 Hyperkalemia: Secondary | ICD-10-CM | POA: Insufficient documentation

## 2023-12-20 DIAGNOSIS — R55 Syncope and collapse: Secondary | ICD-10-CM | POA: Diagnosis not present

## 2023-12-20 LAB — TROPONIN I (HIGH SENSITIVITY)
Troponin I (High Sensitivity): 7 ng/L (ref <18)
Troponin I (High Sensitivity): 5 ng/L (ref <18)

## 2023-12-20 LAB — CBC WITH DIFFERENTIAL/PLATELET
Abs Immature Granulocytes: 0.04 K/uL (ref 0.00–0.07)
Basophils Absolute: 0.1 K/uL (ref 0.0–0.1)
Basophils Relative: 1 %
Eosinophils Absolute: 0.3 K/uL (ref 0.0–0.5)
Eosinophils Relative: 5 %
HCT: 34.7 % — ABNORMAL LOW (ref 39.0–52.0)
Hemoglobin: 10.9 g/dL — ABNORMAL LOW (ref 13.0–17.0)
Immature Granulocytes: 1 %
Lymphocytes Relative: 23 %
Lymphs Abs: 1.6 K/uL (ref 0.7–4.0)
MCH: 29.5 pg (ref 26.0–34.0)
MCHC: 31.4 g/dL (ref 30.0–36.0)
MCV: 94 fL (ref 80.0–100.0)
Monocytes Absolute: 0.7 K/uL (ref 0.1–1.0)
Monocytes Relative: 11 %
Neutro Abs: 4.2 K/uL (ref 1.7–7.7)
Neutrophils Relative %: 59 %
Platelets: 238 K/uL (ref 150–400)
RBC: 3.69 MIL/uL — ABNORMAL LOW (ref 4.22–5.81)
RDW: 15.9 % — ABNORMAL HIGH (ref 11.5–15.5)
WBC: 6.9 K/uL (ref 4.0–10.5)
nRBC: 0 % (ref 0.0–0.2)

## 2023-12-20 LAB — COMPREHENSIVE METABOLIC PANEL WITH GFR
ALT: 11 U/L (ref 0–44)
AST: 15 U/L (ref 15–41)
Albumin: 3.1 g/dL — ABNORMAL LOW (ref 3.5–5.0)
Alkaline Phosphatase: 57 U/L (ref 38–126)
Anion gap: 6 (ref 5–15)
BUN: 74 mg/dL — ABNORMAL HIGH (ref 8–23)
CO2: 26 mmol/L (ref 22–32)
Calcium: 8.6 mg/dL — ABNORMAL LOW (ref 8.9–10.3)
Chloride: 103 mmol/L (ref 98–111)
Creatinine, Ser: 2.58 mg/dL — ABNORMAL HIGH (ref 0.61–1.24)
GFR, Estimated: 24 mL/min — ABNORMAL LOW (ref >60)
Glucose, Bld: 144 mg/dL — ABNORMAL HIGH (ref 70–99)
Potassium: 5.2 mmol/L — ABNORMAL HIGH (ref 3.5–5.1)
Sodium: 135 mmol/L (ref 135–145)
Total Bilirubin: 0.5 mg/dL (ref 0.0–1.2)
Total Protein: 7 g/dL (ref 6.5–8.1)

## 2023-12-20 MED ORDER — SODIUM CHLORIDE 0.9 % IV BOLUS
500.0000 mL | Freq: Once | INTRAVENOUS | Status: AC
  Administered 2023-12-20: 500 mL via INTRAVENOUS

## 2023-12-20 NOTE — Discharge Instructions (Addendum)
 You were seen in the emergency department today for evaluation after an episode of passing out.  Your testing was fortunately reassuring against an emergency cause for this.  I suspect that you may be more likely to pass out as your blood pressure levels were borderline low.  I think it would be reasonable to hold off on taking your benazepril  for the next couple of days until you are able to get a hold of your primary care doctor.  Please discuss with them if they would like you to resume that or if they would like you to make any changes to your Lasix .  Follow-up with your primary care doctor soon as possible for reevaluation.  Return to the ER for any new or worsening symptoms.

## 2023-12-20 NOTE — ED Triage Notes (Signed)
 Pt arrives via EMS from home with reports of syncopal episode in the shower. EMS found pt on shower floor with BP in 80s. Home aid reports he had some shaking and hx of pseudoseizures. 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 EMS gave 500 cc NS.

## 2023-12-20 NOTE — ED Notes (Signed)
 Dr. Levander informed of patient's blood pressure of 89/51. Pt remains AxOx4, no associated symptoms. IVF bolus to be ordered and administered

## 2023-12-20 NOTE — ED Provider Notes (Signed)
 [facility] Provider Note    Event Date/Time   First MD Initiated Contact with Patient 12/20/23 1137     (approximate)   History   Loss of Consciousness   HPI  Derek Andersen is a 83 year old male with history of CKD, A-fib, CAD, T2DM presenting to the emergency department following a syncopal episode.  Patient has a home health aide.  He was sitting on the shower when he reports he went to lean over and passed out.  Thinks he hit his head on the shower wall.  Denies hitting his back or injury to other areas.  Does report that this has happened multiple times in the past.  Did have a brief episode of shaking noticed by his home health aide, but reportedly only lasted for few seconds  before patient was awake and did not have any appreciable postictal period.  Initial SBP in the 80s for EMS.  I reviewed patient's cardiology visit from 02/28/2023.  At that time patient was continued on blood pressure medications and Eliquis  for anticoagulation in the setting of his A-fib.  Syncope noted as a diagnosis at that time.    Physical Exam   Triage Vital Signs: ED Triage Vitals  Encounter Vitals Group     BP 12/20/23 1135 133/66     Girls Systolic BP Percentile --      Girls Diastolic BP Percentile --      Boys Systolic BP Percentile --      Boys Diastolic BP Percentile --      Pulse Rate 12/20/23 1135 70     Resp 12/20/23 1135 18     Temp 12/20/23 1137 98.1 F (36.7 C)     Temp src --      SpO2 12/20/23 1135 97 %     Weight 12/20/23 1135 214 lb 15.2 oz (97.5 kg)     Height 12/20/23 1135 5' 6 (1.676 m)     Head Circumference --      Peak Flow --      Pain Score 12/20/23 1136 0     Pain Loc --      Pain Education --      Exclude from Growth Chart --     Most recent vital signs: Vitals:   12/20/23 1410 12/20/23 1415  BP:  (!) 124/59  Pulse: 65 64  Resp: 18 (!) 21  Temp:    SpO2: 98% 98%     General: Awake, interactive  CV:  Good peripheral  perfusion, regular rate Resp:  Unlabored respirations, lungs clear to auscultation Abd:  Nondistended, soft, nontender Neuro:  Symmetric facial movement, fluid speech, 5 out of 5 strength of bilateral upper and lower extremities with intact sensation Other:   Head atraumatic, no tenderness to palpation of chest wall, abdomen, no focal area of point tenderness over the spine, some generalized low back tenderness that patient reports is not new   ED Results / Procedures / Treatments   Labs (all labs ordered are listed, but only abnormal results are displayed) Labs Reviewed  CBC WITH DIFFERENTIAL/PLATELET - Abnormal; Notable for the following components:      Result Value   RBC 3.69 (*)    Hemoglobin 10.9 (*)    HCT 34.7 (*)    RDW 15.9 (*)    All other components within normal limits  COMPREHENSIVE METABOLIC PANEL WITH GFR - Abnormal; Notable for the following components:   Potassium 5.2 (*)    Glucose, Bld  144 (*)    BUN 74 (*)    Creatinine, Ser 2.58 (*)    Calcium 8.6 (*)    Albumin 3.1 (*)    GFR, Estimated 24 (*)    All other components within normal limits  TROPONIN I (HIGH SENSITIVITY)  TROPONIN I (HIGH SENSITIVITY)     EKG EKG independently reviewed and interpreted by myself demonstrates:  EKG demonstrates sinus rhythm at a rate of 70, PR 164, QRS 106, QTc 412, significant artifact noted but P waves most appreciable in lead V2, no STEMI  RADIOLOGY Imaging independently reviewed and interpreted by myself demonstrates:  CT head without acute bleed CT C-spine without acute fracture, radiology notes incidental thyroid nodule  Formal Radiology Read:  CT Head Wo Contrast Result Date: 12/20/2023 EXAM: CT HEAD WITHOUT CONTRAST 12/20/2023 12:00:11 PM TECHNIQUE: CT of the head was performed without the administration of intravenous contrast. Automated exposure control, iterative reconstruction, and/or weight based adjustment of the mA/kV was utilized to reduce the radiation  dose to as low as reasonably achievable. COMPARISON: CT head without contrast 07/09/2023. CLINICAL HISTORY: Head trauma, minor (Age >= 65y). Pt arrives via EMS from home with reports of syncopal episode in the shower. EMS found pt on shower floor with BP in 80s. Home aid reports he had some shaking and hx of pseudoseizures. 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 EMS gave ; 500 cc NS. FINDINGS: BRAIN AND VENTRICLES: No acute hemorrhage. No evidence of acute infarct. No hydrocephalus. No extra-axial collection. No mass effect or midline shift. Mild atrophy and moderate white matter changes are stable. This most likely reflects the sequelae of chronic microvascular ischemia. Atherosclerotic calcifications are present in the cavernous carotid arteries bilaterally and at the dural margin of both vertebral arteries. No hyperdense vessel is present. ORBITS: Bilateral lens replacements are noted. The globes and orbits are otherwise within normal limits. SINUSES: No acute abnormality. SOFT TISSUES AND SKULL: No acute soft tissue abnormality. No skull fracture. IMPRESSION: 1. No acute intracranial abnormality. 2. Stable mild atrophy and moderate white matter changes, likely sequelae of chronic microvascular ischemia. 3. Atherosclerotic calcifications in the cavernous carotid arteries bilaterally and at the dural margin of both vertebral arteries. No hyperdense vessel is present. Electronically signed by: Lonni Necessary MD 12/20/2023 12:14 PM EDT RP Workstation: [id number]   CT Cervical Spine Wo Contrast Result Date: 12/20/2023 EXAM: CT CERVICAL SPINE WITHOUT CONTRAST 12/20/2023 12:00:11 PM TECHNIQUE: CT of the cervical spine was performed without the administration of intravenous contrast. Multiplanar reformatted images are provided for review. Automated exposure control, iterative reconstruction, and/or weight based adjustment of the mA/kV was utilized to reduce the radiation dose to as low as  reasonably achievable. COMPARISON: CT the cervical spine 07/09/2023. CLINICAL HISTORY: Neck trauma (Age >= 65y). Pt arrives via EMS from home with reports of syncopal episode in the shower. EMS found pt on shower floor with BP in 80s. Home aid reports he had some shaking and hx of pseudoseizures. 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 EMS gave ; 500 cc NS. FINDINGS: CERVICAL SPINE: BONES AND ALIGNMENT: Fusion hardware was extended and now traverses C2 through the upper thoracic spine. Hardware is intact. Effusion is solid. No acute fracture or traumatic malalignment. DEGENERATIVE CHANGES: No significant degenerative changes. SOFT TISSUES: 3 cm lesion is present in the left lobe of the thyroid. No prevertebral soft tissue swelling. IMPRESSION: 1. No acute abnormality of the cervical spine related to the reported  neck trauma. 2. Solid fusion and intact hardware traversing C2 through the upper thoracic spine. 3. 3 cm thyroid nodule in the left lobe. Recommend nonemergent right ultrasound for further evaluation. Electronically signed by: Lonni Necessary MD 12/20/2023 12:09 PM EDT RP Workstation: [id number]    PROCEDURES:  Critical Care performed: No  Procedures   MEDICATIONS ORDERED IN ED: Medications  sodium chloride  0.9 % bolus 500 mL (0 mLs Intravenous Stopped 12/20/23 1352)     IMPRESSION / MDM / ASSESSMENT AND PLAN / ED COURSE  I reviewed the triage vital signs and the nursing notes.  Differential diagnosis includes, but is not limited to, anemia, electrolyte abnormality, dehydration, arrhythmia, ACS  Patient's presentation is most consistent with acute presentation with potential threat to life or bodily function.  83 year old male presenting following a syncopal episode with head trauma.  Stable vitals on presentation.  Will obtain labs, CT head and C-spine to further evaluate.  Clinical Course as of 12/20/23 1452  Sat Dec 20, 2023  1258 CBC notable for anemia with  hemoglobin of 10.9, CMP with renal impairment with creatinine of 2.58. I do note lab work was recently performed outside health system on 9/22.  At that time patient had a hemoglobin of 11, creatinine of 2.3 K6.5 at that time, only minimally elevated at 5.2 today. [NR]  1345 CT imaging without acute traumatic injuries.  Incidental thyroid nodule noted, will update patient on this finding.  Initial troponin negative, given onset shortly prior to presentation will plan for repeat. [NR]  1346 Patient reassessed.  No further episodes of lightheadedness/near syncope.  Low normal blood pressures here, improved with fluids.  Patient reports he recently went up on his Lasix  at his PCPs direction given hyperkalemia on his recent blood work.  Does note that he has been having low blood pressures at home and has been working with his primary care doctor on this over the past few weeks.  Reviewed his most recent med list in the [facility] system.  It appears that he is still on benzaepril as well as Lasix .  Have mild hyperkalemia here, with his low normal blood pressures do think would be reasonable to have the patient hold his benazepril  until he is able to get a hold of his primary care doctor to further discuss his low blood pressures.  Patient comfortable with this plan.  He reports he was aware of his thyroid nodule.  Awaiting results of second troponin.  If negative, patient does feel comfortable with discharge home with outpatient follow-up.  Has had extensive syncope evaluation in the past and feels improved currently.  [NR]  1445 Repeat troponin negative.  Patient reassessed and without any complaints.  Remains comfortable with plan for discharge home.  Strict return precautions provided.  Patient discharged in stable condition. [NR]    Clinical Course User Index [NR] Levander Slate, MD     FINAL CLINICAL IMPRESSION(S) / ED DIAGNOSES   Final diagnoses:  Syncope and collapse  Thyroid nodule  Closed head injury,  initial encounter     Rx / DC Orders   ED Discharge Orders     None        Note:  This document was prepared using Dragon voice recognition software and may include unintentional dictation errors.   Levander Slate, MD 12/20/23 1452

## 2024-03-10 NOTE — Procedures (Signed)
 CPR  Date/Time: 03/10/2024 9:30 PM  Performed by: Benuel Arlin Hollow, MD Authorized by: Benuel Arlin Hollow, MD   The patient had cardiac arrest necessitating CPR. I directed the quality and duration of the CPR.

## 2024-03-10 NOTE — ED Provider Notes (Signed)
 " [facility] EMERGENCY DEPT  ED Provider Note History   Chief Complaint  Patient presents with   Back Pain    History of Present Illness Derek Andersen is an 83 year old male with past medical history of CKD, A-fib, CAD, T2DM, extensive spinal surgery history with hardware initially presenting to triage for bilateral lower extremity weakness, but seen in the resuscitation bay after coding in the CT scanner.  In triage, patient reportedly having worsening lower extremity weakness without urinary retention or other red flag symptoms.  Unfortunately coded in the CT scanner while undergoing scans per the IPA.  Transferred to resuscitation bay for further management.  Per family collateral, patient has had extensive spinal issues.  He does have a history of seizure-like activity when lying flat, but has never coded previously such as in this case.  Unable to obtain any history from the patient.   Patient Info:    History provided by:  Medical records and relative  Level of Interpreter Services: No interpreter needed (no language barrier)  Past Medical History:  Diagnosis Date   Anemia in chronic kidney disease 05/31/2019   Atrial fibrillation (CMS/HHS-HCC)    CAD (coronary artery disease)    Closed fracture of eleventh thoracic vertebra, unspecified fracture morphology, initial encounter (CMS/HHS-HCC) 04/18/2014   DM (diabetes mellitus), type 2, uncontrolled, with renal complications    ED (erectile dysfunction)    Family history of colon cancer    GERD (gastroesophageal reflux disease) 12/19/2015   Glaucoma (increased eye pressure)    Heart attack (CMS/HHS-HCC)    Heart disease    Hematologic abnormality    Hiatal hernia    History of blood transfusion    History of cancer 2013 or 2014   Skin Cancer Only   History of cataract    History of coronary artery stent placement 11/26/2013   History of glaucoma    History of headache Past year   History of motion sickness     HTN (hypertension)    Hyperlipidemia    Hypertension. 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   Syncope and collapse since Feb 2017   Type 2 diabetes, uncontrolled, with neuropathy 02/10/2013   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   Past Surgical History:  Procedure Laterality Date   MINIMALLY INVASIVE DIRECT CORONARY ARTERY BYPASS W/SINGLE ARTERY GRAFT N/A 11/29/2013   Procedure: MINIMALLY INVASIVE DIRECT CORONARY ARTERY BYPASS W/SINGLE ARTERY GRAFT, MICS, Left internal mammary artery harvest;  Surgeon: Norleen Dow Exon, MD;  Location: [facility];  Service: Cardiothoracic;  Laterality: N/A;   CORONARY ARTERY BYPASS W/SINGLE ARTERY GRAFT OFF PUMP N/A 11/29/2013   Procedure: CORONARY ARTERY BYPASS W/SINGLE ARTERY GRAFT OFF PUMP;  Surgeon: Norleen Dow Exon, MD;  Location: [facility];  Service: Cardiothoracic;  Laterality: N/A;   TRANSESOPHAGEAL ECHOCARDIOGRAPHY N/A 11/29/2013   Procedure: TRANSESOPHAGEAL ECHOCARDIOGRAPHY;  Surgeon: Norleen Dow Exon, MD;  Location: [facility];  Service: Cardiothoracic;  Laterality: N/A;  performed by  anesthesia   ARTHRODESIS POSTERIOR LUMBAR SPINE W/LAMINECTOMY/DISCECTOMY N/A 04/17/2014   Procedure: ARTHRODESIS LUMBAR POSTERIOR/POSTEROLATERAL W/POSTERIOR INTERBODY INCLUDING LAMINECTOMY/DISCECTOMY;  Surgeon: Unice Gustav Robinette Reno, MD;  Location: [facility];  Service: Orthopedics;   Laterality: N/A;   ARTHRODESIS ANTERIOR CERVICLE SPINE N/A 03/15/2015   Procedure: ARTHRODESIS, ANTERIOR INTERBODY, INCLUDING DISC SPACE PREPARATION, DISCECTOMY, OSTEOPHYTECTOMY AND DECOMPRESSION OF SPINAL CORD AND/ORNERVE ROOTS; CERVICAL BELOW C2 C6-C7;  Surgeon: Unice Gustav Robinette Reno, MD;  Location: [facility];  Service: Orthopedics;  Laterality: N/A;   INSTRUMENTATION ANTERIOR SPINE 2 TO 3 VERTEBRAL SEGMENTS N/A 03/15/2015   Procedure: ANTERIOR INSTRUMENTATION; 2 TO 3 VERTEBRAL SEGMENTS (LIST IN ADDITION TO PRIMARY PROCEDURE);  Surgeon: Unice Gustav Robinette Reno, MD;  Location: [facility];  Service: Orthopedics;  Laterality: N/A;   INSERTION STRUCTURAL BONE ALLOGRAFT FOR SPINE SURGERY N/A 03/15/2015   Procedure: ALLOGRAFT, STRUCTURAL, FOR SPINE SURGERY ONLY (LIST IN ADDITION TO PRIMARY PROCEDURE);  Surgeon: Unice Gustav Robinette Reno, MD;  Location: [facility];  Service: Orthopedics;  Laterality: N/A;   AUTOGRAFT STRUCTURAL ICBG OBTAINED SEPARATE INCISION FOR SPINE SURGERY N/A 03/15/2015   Procedure: AUTOGRAFT FOR SPINE SURGERY ONLY (INCL HARVESTING GRAFT); STRUCTURAL, BICORTICAL OR TRICORTICAL (THROUGH SEPARATE SKIN OR FASCIAL INCISION) (LIST IN ADDITION TO PRIMARY PROCEDURE);  Surgeon: Unice Gustav Robinette Reno, MD;  Location: [facility];  Service: Orthopedics;  Laterality: N/A;   POSTERIOR THORACIC SPINE FUSION/ARTHRODESIS ONE LEVEL N/A 12/13/2015   Procedure: ARTHRODESIS, POSTERIOR OR POSTEROLATERAL TECHNIQUE, SINGLE LEVEL; THORACIC (WITH LATERAL TRANSVERSE TECHNIQUE, WHEN PERFORMED) T4-T5;  Surgeon: Unice Gustav Robinette Reno, MD;  Location: [facility];  Service: Orthopedics;  Laterality: N/A;   INSTRUMENTATION POSTERIOR SPINE 3 TO 6 VERTEBRAL SEGMENTS N/A 12/13/2015   Procedure: POSTERIOR SEGMENTAL INSTRUMENTATION (EG, PEDICLE FIXATION, DUAL RODS WITH MULTIPLE HOOKS AND SUBLAMINAR WIRES); 3 TO 6 VERTEBRAL SEGMENTS (LIST IN  ADDITION TO PRIMARY PROCEDURE) T4-T8 Synthes/Depuy;  Surgeon: Unice Gustav Robinette Reno, MD;  Location: [facility];  Service: Orthopedics;  Laterality: N/A;   INSERTION MORSELIZED BONE ALLOGRAFT FOR SPINE SURGERY N/A 12/13/2015   Procedure: ALLOGRAFT, MORSELIZED, OR PLACEMENT OF OSTEOPROMOTIVE MATERIAL, FOR SPINE SURGERY ONLY (LIST IN ADDITION TO PRIMARY PROCEDURE) DBM Xemplifi;  Surgeon: Unice Gustav Robinette Reno, MD;  Location: [facility];  Service: Orthopedics;  Laterality: N/A;   MICROSURGERY N/A 12/13/2015   Procedure: MICROSURGICAL TECHNIQUES, REQUIRING USE OF OPERATING MICROSCOPE (LIST IN ADDITION TO PRIMARY PROCEDURE);  Surgeon: Unice Gustav Robinette Reno, MD;  Location: [facility];  Service: Orthopedics;  Laterality: N/A;   ARTHRODESIS POSTERIOR CERVICLE SPINE N/A 06/17/2018   Procedure: Airo 3. ARTHRODESIS, POSTERIOR TECHNIQUE, ATLAS-AXIS (C1-C2)  C1-C2;  Surgeon: Robinette Reno, Unice Gustav, MD;  Location: [facility];  Service: Orthopedics;  Laterality: N/A;   POSTERIOR THORACIC SPINE FUSION/ARTHRODESIS ONE LEVEL N/A 06/17/2018   Procedure: ARTHRODESIS, POSTERIOR OR POSTEROLATERAL TECHNIQUE, SINGLE LEVEL; CERVICAL BELOW C2 SEGMENT  C2-C3;  Surgeon: Robinette Reno, Unice Gustav, MD;  Location: [facility];  Service: Orthopedics;  Laterality: N/A;   LAMINECTOMY POSTERIOR CERVICLE DECOMP W/FACETECTOMY & FORAMINOTOMY N/A 06/17/2018   Procedure: LAMINECTOMY POSTERIOR CERVICAL DECOMP W/FACETECTOMY & FORAMINOTOMY C1;  Surgeon: Robinette Reno, Unice Gustav, MD;  Location: [facility];  Service: Orthopedics;  Laterality: N/A;   LAMINECTOMY POSTERIOR CERVICLE DECOMP W/FACETECTOMY & FORAMINOTOMY N/A 06/17/2018   Procedure: LAMINECTOMY, FACETECTOMY AND FORAMINOTOMY, SINGLE VERTEBRAL SEGMENT; EACH ADDITIONAL SEGMENT, CERVICAL, THORACIC, OR LUMBAR (LIST IN ADDITION TO PRIMARY PROCEDURE) C2;  Surgeon: Robinette Reno, Unice Gustav, MD;   Location:  [facility];  Service: Orthopedics;  Laterality: N/A;   REMOVAL HARDWARE BACK N/A 06/17/2018   Procedure: REMOVAL OF IMPLANT; BACK, DEEP (EG, BURIED WIRE, PIN, SCREW, METAL BAND, NAIL, ROD OR PLATE);  Surgeon: Robinette Farley, Unice Merck, MD;  Location: [facility];  Service: Orthopedics;  Laterality: N/A;   INSTRUMENTATION POSTERIOR SPINE 7 TO 12 VERTEBRAL SEGMENTS N/A 06/17/2018   Procedure: POSTERIOR SEGMENTAL INSTRUMENTATION (EG, PEDICLE FIXATION, DUAL RODS WITH MULTIPLE HOOKS AND SUBLAMINAR WIRES); 3 TO 6 VERTEBRAL SEGMENTS (LIST IN ADDITION TO PRIMARY PROCEDURE);  Surgeon: Robinette Farley, Unice Merck, MD;  Location: [facility];  Service: Orthopedics;  Laterality: N/A;   INSERTION MORSELIZED BONE ALLOGRAFT FOR SPINE SURGERY N/A 06/17/2018   Procedure: ALLOGRAFT, MORSELIZED, OR PLACEMENT OF OSTEOPROMOTIVE MATERIAL, FOR SPINE SURGERY ONLY (LIST IN ADDITION TO PRIMARY PROCEDURE);  Surgeon: Robinette Farley, Unice Merck, MD;  Location: [facility];  Service: Orthopedics;  Laterality: N/A;   AUTOGRAFT OBTAINED SAME INCISION FOR SPINE SURGERY N/A 06/17/2018   Procedure: AUTOGRAFT FOR SPINE SURGERY ONLY (INCL HARVESTING GRAFT); LOCAL (EG, RIBS, SPINOUS PROCESS, OR LAMINAR FRAGMENT) OBTAINED FROM SAME INCISION (LIST IN ADDITION TO PRIMARY PROCEDURE);  Surgeon: Robinette Farley, Unice Merck, MD;  Location: [facility];  Service: Orthopedics;  Laterality: N/A;   MICROSURGERY N/A 06/17/2018   Procedure: MICROSURGICAL TECHNIQUES, REQUIRING USE OF OPERATING MICROSCOPE (LIST IN ADDITION TO PRIMARY PROCEDURE);  Surgeon: Robinette Farley, Unice Merck, MD;  Location: [facility];  Service: Orthopedics;  Laterality: N/A;   LUMB SPINE FUSION COMBINED N/A 07/11/2023   Procedure: ARTHRODESIS, POSTERIOR INTERBODY TECHNIQUE, INCL LAMINECTOMY AND/OR DISCECTOMY TO PREPARE INTERSPACE, SINGLE INTERSPACE; EACH ADDITIONAL INTERSPACE (LIST IN ADDITION TO CODE  FOR PRIMARY PROCEDURE) C4-T4, laminectomies C6, C7, T1, T2;  Surgeon: Eppie Vicenta Bare, MD;  Location: [facility];  Service: Orthopedics;  Laterality: N/A;   ARTHRODESIS POSTERIOR LUMBAR SPINE W/LAMINECTOMY/DISCECTOMY N/A 07/11/2023   Procedure: ARTHRODESIS, POSTERIOR INTERBODY TECHNIQUE, INCLUDING LAMINECTOMY AND/OR DISCECTOMY TO PREPARE INTERSPACE (OTHER THAN FOR DECOMPRESSION), SINGLE INTERSPACE; LUMBAR, STEREOTACTIC WITH BRAINLAB;  Surgeon: Eppie Vicenta Bare, MD;  Location: [facility];  Service: Orthopedics;  Laterality: N/A;   APPENDECTOMY     BACK SURGERY     4 low back surgeries, 2 cervical surgeries Rods placed last surgery 2005/2006   CATARACT EXTRACTION  In 80's   CORONARY ANGIOPLASTY WITH STENT PLACEMENT     CORONARY ARTERY BYPASS GRAFT     FRACTURE SURGERY Right    ankle   HERNIA REPAIR  Infant   KNEE ARTHROSCOPY     LENS EYE SURGERY     SKIN CANCER EXCISION     SPINE SURGERY  03/15/2015-12/13/2015-04/17/2014   TONSILLECTOMY     VASECTOMY     Family History  Problem Relation Age of Onset   Diabetes Mother    Colon cancer Mother        71   Heart disease Mother    Hepatitis Mother        94's   High blood pressure (Hypertension) Mother    Kidney disease Mother    Stroke Mother    Ulcers Mother    Cancer Mother        Colon Cancer   Anemia Mother    Colon cancer Father        1's   Cancer Father        Colon Cancer   Dementia Father    Heart disease Brother    Heart disease Daughter    High  blood pressure (Hypertension) Daughter    Stroke Daughter        2009 &2017   High blood pressure (Hypertension) Son    Diabetes Paternal Aunt    Heart disease Brother        1/2 Brother/Currently   High blood pressure (Hypertension) Brother    Sleep disorder Brother    Anesthesia problems Neg Hx    Malignant hyperthermia Neg Hx    Social History   Socioeconomic History   Marital status: Widowed   Tobacco Use   Smoking status: Former    Types: Cigars    Passive exposure: Past   Smokeless tobacco: Former    Types: Chew   Tobacco comments:    only smoked cigars for few years early age  Vaping Use   Vaping status: Never Used  Substance and Sexual Activity   Alcohol use: Not Currently   Drug use: No   Sexual activity: Not Currently    Partners: Female   Social Drivers of Health   Financial Resource Strain: Low Risk  (07/14/2023)   Overall Financial Resource Strain (CARDIA)    Difficulty of Paying Living Expenses: Not hard at all  Food Insecurity: No Food Insecurity (07/14/2023)   Hunger Vital Sign    Worried About Running Out of Food in the Last Year: Never true    Ran Out of Food in the Last Year: Never true  Transportation Needs: No Transportation Needs (03/11/2024)   PRAPARE - Transportation    Lack of Transportation (Medical): No    Lack of Transportation (Non-Medical): No  Stress: Stress Concern Present (08/13/2023)   [employer] - Occupational Stress Questionnaire    Feeling of Stress : To some extent  Housing Stability: Low Risk  (07/14/2023)   Housing Stability Vital Sign    Unable to Pay for Housing in the Last Year: No    Number of Times Moved in the Last Year: 0    Homeless in the Last Year: No   Review of Systems Refer to HPI   Physical Exam  BP (!) 181/99   Pulse 77   Temp 36.7 C (98.1 F)   Resp 20   Ht 167.6 cm (5' 5.98)   Wt 87.5 kg (192 lb 14.4 oz)   SpO2 97%   BMI 31.15 kg/m  Physical Exam Vitals and nursing note reviewed.  Constitutional:      Appearance: He is not diaphoretic.     Comments: Actively being coded.   HENT:     Head: Normocephalic and atraumatic.     Right Ear: External ear normal.     Left Ear: External ear normal.     Nose: Nose normal. No congestion or rhinorrhea.     Mouth/Throat:     Mouth: Mucous membranes are moist.  Eyes:     Pupils: Pupils are equal, round, and reactive  to light.  Cardiovascular:     Comments: Pulseless.  Pulmonary:     Comments: Being bagged.  Abdominal:     General: There is no distension.  Musculoskeletal:        General: No swelling, deformity or signs of injury.     Right lower leg: No edema.     Left lower leg: No edema.  Skin:    General: Skin is warm and dry.     Coloration: Skin is not jaundiced or pale.     Findings: No bruising or rash.  Neurological:     Comments: Unresponsive.  Physical Exam   Procedures  83 y.o. Ht:167.6 cm (5' 5.98) Wt:87.5 kg (192 lb 14.4 oz) ADJ:Anib surface area is 2.02 meters squared.  Intubation/Airway Management Procedure Location: ED (adult)   Date/Time: 03/10/2024 9:50 PM Performed by: Dorien Richerd Pica, MD  Authorized by: Laurian Penne Birmingham, MD   Preprocedure Checklist: Immediately prior to procedure, a timeout was not called.  Pre-procedure details:    No history of difficult intubation   Airway not difficult TODAY   Hemodynamic instability (Current/Anticipated)   Critical airway team not notified prior to intubation attempt   CPR started/in progress   Indications: airway protection/clearance, cardio/pulmonary arrest, respiratory failure and hemodynamic instability   Patient status:  Unresponsive   Respiratory support prior to intubation procedure: easy mask  Medications   Pretreatment medications:  None   Sedative medications: etomidate   Paralytics:  Rocuronium    Sequence method: rapid sequence w/o PPV   Highest SpO2: 100 Successful Intubation/Airway details:    Apneic oxygenation not performed ()    L/min   % FiO2   Performed by: Emergency medicine resident   Resident/Level: PGY2   Final Airway Type: endotracheal tube   Technique used: .video laryngoscopy   Blade Type/Airway Visualization Device: Mac   Blade size: 4   Cormack-Lehane VIDEO airway view grade: grade v I - full view of glottis   Video laryngoscopy used by performer   Adjunct(s)  used: stylet   Tube visualized through cords   Tube size (mm):  7.5   Tube is cuffed   Total number of attempts (incl. successful): 1   Lowest SpO2: 87     Placement assessment:    Tube secured with:  ETT holder   Breath sounds:  Equal   Placement verification: CXR verification, direct visualization, equal breath sounds and colorimetric ETCO2 device     CXR findings:  Endotracheal tube in appropriate position  Post-procedure sedation medication(s): propofol  and fentanyl   Post-procedure details:  No adverse event(s) noted  Consent:     Acuity: emergency   Consent obtained:  Emergent situation   No family/caregiver in room during procedure Arterial Line  Date/Time: 03/11/2024 7:33 AM  Performed by: Dorien Richerd Pica, MD Authorized by: Laurian Penne Birmingham, MD   Consent:    Consent obtained:  Emergent situation Universal protocol:    Immediately prior to procedure, a time out was called: yes     Patient identity confirmed:  Verbally with patient Indications:    Indications: hemodynamic monitoring and multiple ABGs   Pre-procedure details:    Skin preparation:  Chlorhexidine    Preparation: Patient was prepped and draped in sterile fashion   Sedation:    Sedation type:  None Anesthesia:    Anesthesia method:  None Procedure details:    Location:  R radial   Needle gauge:  20 G   Placement technique:  Ultrasound guided   Number of attempts:  1   Transducer: waveform confirmed   Post-procedure details:    Post-procedure:  Secured with tape, sterile dressing applied and sutured   Procedure completion:  Tolerated well, no immediate complications    Results  Medical Decision Making   Medical Decision Making 83yo w/ hx of a-fib, CAD, T2DM, extensive spinal hx p/w cardiac arrest.   Cardiac arrest w/ ROSC  - unclear etiology of code, suspect sepsis vs primary cardiac (arrhythmia?) vs primary respiratory (PE?); no evidence of significant electrolyte  derangements, hypo/hyperglycemia or other acute changes on labs to explain why; could potentially have neuro  etiology as primary, although CTH reassuring against acute path such as stroke or bleed; considered other etiologies including OD, intoxication, etc. But serum tox normal  - s/p ~48min of CPR, no shocks, multiple rounds epi, Ca gluconate, dextrose   - full sepsis workup  - CT scans to eval for PE, dissection, acute intraabdominal or pulmonary processes  - goal normothermic  - ICU care  - art line   C/f sepsis  - spiked fever to 38C, found to have urine w/ heavy sediment (highest suspicion for urosepsis w/o other clear infectious sources at this time)  - s/p vanc/zosyn   - s/p 1L IVF (limited iso c/f newly reduced EF s/p cardiac arrest)  - initially normotensive, was started on low-dose levophed given downtrending BP and new hypotension   C/f new EF reduction  - post-ROSC  - will need formal echo  - no hx of HFrEF, reassuring BNP here acute acute HF  - EKG showing ST elevations in V1-V4 seen previously, initially higher but improved on addtl repeat EKGs - trop trending up, likely iso cardiac arrest    Overall reassuring labs and imaging here despite cardiac arrest; no clear etiologies for presentation but highest suspicion for urosepsis vs arrhythmia given hx of significant CAD. Required ICU for addtl management. Discussed w/ MICU and CCU, ultimately MICU taking pt given c/f sepsis and lower suspicion for primary cardiac etiology per CCU fellow.    Differential Diagnosis:    In addition to the differential diagnoses listed, other diagnoses were considered.   Medical Complexity:    New and requires workup.     Pertinent labs & imaging results that were available during my care of the patient were reviewed by me and considered in my medical decision making.     I obtained history from someone other than the patient.     I reviewed previous medical records.     I independently  visualized image(s), tracing(s), and/or specimen(s).     I discussed the patient with another provider.    ED Course as of 03/11/24 0934  Wed Mar 10, 2024  2130 Assumed care of patient from the CT scanner after patient coded. Was hallway patient, reportedly talking in triage, brought over to CT scanner and lost pulses when laid flat. CPR initiated, lasting approx 10 min, no shocks given, received multiple doses of epi, glucose. Transferred to resus 4, where he was intubated and art line was placed.  [VD]  2149 Obtaining additional labs and prioritizing CT scans given c/f intraabdominal pathology such as aortic dissection.  [VD]  2218 pH, Venous(!): 7.28 Likely combined metabolic and respiratory acidosis, elevated pCO2 to 61, slight elevation of bicarb and normal base excess.  Lactate elevated to 2.5, likely of ischemia from code. [VD]  2219 WBC: 8.7 No leukocytosis, reassuring against severe infectious or inflammatory response.  [VD]  2219 Hemoglobin(!): 12.9 Actually improved from prior around 9-10, could also represent some hemoconcentration. [VD]  2219 Platelet Count /L: 395 No thrombocytosis or thrombocytopenia. [VD]  2219 Sodium(!): 130 Hyponatremia, but would not that this would be the the cause of the code.  No other significant electrolyte derangements, potassium within normal limits, although chloride low.  Also added on magnesium and phosphorus for additional electrolyte evaluation. [VD]  2220 Creatinine(!): 1.8 Has a history of CKD, has had creatinines from 1.5-2.6.  No acute worsening in kidney function, BUN only mildly elevated at 30.  Low suspicion for uremia. [VD]  2220 Calcium: 9.2 Within normal limits. [VD]  2220 X-ray chest single view portable Wet Read Interval placement of an endotracheal tube within the thoracic inlet. Mediastinal and cardiac contours are within normal limits. Persistent low lung volumes. Persistent left basilar consolidation which may reflect  atelectasis and/or infection the appropriate clinical setting.SABRA Possible left pleural effusion. No pneumothorax.  [VD]  2332 Now febrile to 38C, urine has gross sediment present, ordered vanc/zosyn  and obtaining UA. Not giving 30mL/kg IVF given c/f newly reduced EF iso cardiac arrest, will give gentle IVF 1L over 2h. In CT scanner now w/ physician monitoring given c/f seizures, coding while lying flat. Per discussions with family, he has hx of questionable seizure activity when he laid flat. Therefore, bringing Versed and NE to scanner for CT scans.  [VD]  2334 hsTnI Baseline: 24 Will continue to trend, suspect uptrend iso cardiac arrest.  [VD]  2334 Magnesium: 2.2 No significant mag or phos derangements despite c/f arrhythmia and QT prolongation on monitor, EKG.  [VD]  2334 Creatine Kinase Total: 113 This and now normal lactate reassuring against seizure activity; prophylactically loaded on Keppra.  [VD]  2335 Hepatic Function Panel (HFP)(!) No significant LFT abnormalities, reassuring against hepatobiliary pathology such as cholangitis or other acute path that could lead to code.  [VD]  2335 Toxicology (Drug) Screen, Serum(!) Neg for EtOH. [VD]  2335 Prothrombin INR(!): 1.2 No significant coagulopathies that could precipitate pro-thrombotic or embolic states.  [VD]  2336 Pro-Brain Natriuretic Peptide, N-terminal (NT-Pro-BNP): 467 Reassuring against acute HF failure.  [VD]  2339 CT scans complete, no acute intracranial findings, no obvious dissections or other potential surgical emergencies identified on brief review. Planning on calling rads to discuss expedited review and will reach out to MICU for admit.  [VD]    ED Course User Index [VD] Dronzek, Richerd Pica, MD      Medications Administered in the Emergency Department   vancomycin  pharmacy consult (has no administration in time range) piperacillin -tazobactam (ZOSYN ) 3.375 g in sodium chloride  0.9 % 100 mL IVPB (has no administration  in time range) heparin in dextrose  5% infusion 25,000 units/250 mL (1,200 Units/hr Intravenous Rate/Dose Verify 03/11/24 0708) VANCOMYCIN  DOSING PER LEVELS (has no administration in time range) dextrose  50% in water solution 12.5-25 g (has no administration in time range) glucagon (GLUCAGEN) injection 1 mg (has no administration in time range) insulin  LISPRO (AdmeLOG, HumaLOG) injection correction dose 0-12 Units (6 Units Subcutaneous Given 03/11/24 0600) brimonidine  (ALPHAGAN ) 0.2 % ophthalmic solution 1 drop (1 drop Both Eyes Given 03/11/24 0934) dabigatran  (PRADAXA ) capsule 75 mg ( Oral Automatically Held 03/18/24 1700) escitalopram  oxalate (LEXAPRO ) tablet 15 mg (has no administration in time range) hydrocortisone 2.5 % cream (has no administration in time range) pravastatin  (PRAVACHOL ) tablet 40 mg ( Via Tube Automatically Held 03/18/24 0900) polyethylene glycol (MIRALAX) packet 17 g (has no administration in time range) sennosides-docusate (SENOKOT-S) 8.6-50 mg tablet 2 tablet (has no administration in time range) carboxymethylcellulose (REFRESH PLUS) 0.5 % ophthalmic solution 2 drop (has no administration in time range) levETIRAcetam (KEPPRA) injection 750 mg (750 mg Intravenous New Bag 03/11/24 0933) insulin  GLARGINE-yfgn (SEMGLEE ) injection 15 Units (has no administration in time range) omeprazole (PRILOSEC) 2 mg/mL oral suspension 40 mg (40 mg Via Tube Given 03/11/24 0934) NOREPINephrine (LEVOPHED) in sodium chloride  0.9% infusion 16 mg/250 mL (has no administration in time range) acetaminophen  (TYLENOL ) tablet 975 mg (has no administration in time range) fentaNYL  (PF) (SUBLIMAZE ) injection 25 mcg (25 mcg Intravenous Given 03/11/24 0933) acetaminophen  (TYLENOL ) tablet 975 mg (975 mg Oral Given  03/10/24 1935) fentaNYL  (PF) (SUBLIMAZE ) injection (50 mcg Intravenous Given 03/10/24 2118) propofol  (DIPRIVAN ) 10 mg/mL injection (20 mg Intravenous Given 03/10/24 2119) levETIRAcetam (KEPPRA) injection 4,500  mg (0 mg Intravenous Stopped 03/10/24 2129) vancomycin  (VANCOCIN ) in 0.9% sodium chloride  IVPB 2 g/500 mL ( Intravenous Rate/Dose Verify 03/11/24 0400) piperacillin -tazobactam (ZOSYN ) 3.375 g in sodium chloride  0.9 % 100 mL IVPB (0 g Intravenous Stopped 03/11/24 0005) lactated ringers  bolus 1,000 mL ( Intravenous Rate/Dose Verify 03/11/24 0101) iopamidoL (ISOVUE-370) 76% injection 100 mL (100 mLs Intravenous Given 03/11/24 0004) acetaminophen  (OFIRMEV ) 10 mg/mL injection 500 mg (0 mg Intravenous Stopped 03/11/24 0309) PHENYLephrine  in sodium chloride  0.9% (DUH) (NEO-SYNEPHRINE) 2 mg/20 mL injection (  Return to [facility] 03/11/24 0136) EPINEPHrine HCl-0.9% NaCl 100 mcg/10 mL (10 mcg/mL) inj syringe (  Return to [facility] 03/11/24 0136) EPINEPHrine in NS (ADRENALIN) 8 mg/250 mL infusion (  [facility] to [facility] 03/11/24 0136) sodium chloride  (bacteriostatic) 0.9 % injection 10 mL (10 mLs Intravenous Given 03/11/24 0846) perflutren lipid microspheres (DEFINITY) 1.5 mL in sodium chloride  0.9% 8.5 mL injection (2.5 mLs Intravenous Given 03/11/24 0852)   ED Clinical Impression  1. Cardiac arrest with pulseless electrical activity (CMS/HHS-HCC)   2. Chest congestion   3. Leg weakness, bilateral   4. Acute low back pain, unspecified back pain laterality, unspecified whether sciatica present      ED Disposition  Admit          This note has been created using automated tools and reviewed for accuracy by VICTORIA LEA DRONZEK.   AI Note Feedback-- Point of Care Survey   Dronzek, Richerd Pica, MD Resident 03/11/24 [phone]   ------------------------------------------------------------------------------- Attestation signed by Laurian Penne Birmingham, MD at 03/18/2024 12:19 PM I was personally present during the entire procedure.  I have reviewed and agree with the procedural note as documented above.  PENNE BIRMINGHAM LAURIAN, MD  ------------------------------------------------------------------------------- "

## 2024-03-11 NOTE — ED Notes (Signed)
 Pt upstairs with RN and RT. All belongings with pt's family.

## 2024-03-19 NOTE — Discharge Summary (Signed)
 "  [facility] Medicine Discharge Summary  Admit Date: 03/10/2024 Discharge Date: 03/24/2024  Admitting Physician: Gara Door, MD Discharge Physician: Pauline Blanch, MD  Primary Care Provider: Diedra Jerona Ruts, MD, Phone [phone]  Discharge Destination: Home with home health  Admission Diagnoses:  Chest congestion [R09.89] Leg weakness, bilateral [R29.898] Cardiac arrest with pulseless electrical activity (CMS/HHS-HCC) [I46.9] Acute low back pain, unspecified back pain laterality, unspecified whether sciatica present [M54.50] PEA (Pulseless electrical activity) (CMS/HHS-HCC) [I46.9]  Discharge Diagnoses:  Principal Problem (Resolved):   Cardiac arrest (CMS/HHS-HCC) Active Problems:   Acute respiratory failure with hypoxia (CMS/HHS-HCC)   Rhinovirus   Aspiration pneumonia (CMS/HHS-HCC)   UTI (urinary tract infection)   HFrEF (heart failure with reduced ejection fraction) (CMS/HHS-HCC)   Acute deep vein thrombosis (DVT) of femoral vein of right lower extremity (CMS/HHS-HCC)   Multiple rib fractures Resolved Problems:   Septic shock (CMS/HHS-HCC)   Acute kidney injury superimposed on CKD  Primary Diagnosis: Admitted for PEA arrest, c/b rhinovirus and hypoxic respiratory failure  Changes Made (with rationale):  Discharged on oxygen due to persistent hypoxia at rest (bed-bound status)  BP meds adjusted as he became more hypertensive at end of hospitalization (Amlodipine  5mg  restarted), Coreg  3.125mg  BID added.  Insulin  regimen adjusted for mealtimes, continue stable basal dose of 32u, mealtime 3u with correctional scale.  Eliquis  started (Dabigatran  stopped) due to development of femoral clot while on dabigatran    To-Do List (incidental findings, follow-up studies, etc.): HH PT/OT for continued strengthening  BP meds titration  Insulin  titration   Anticipatory Guidance for Outpatient Care:  High risk of aspiration, close monitoring and evaluation of  respiratory status     Results Pending at Discharge:  None Please see phone numbers at end of this summary for lab contact information.   Follow-up/Care Transition Plan: Sched. appts: Future Appointments  Date Time Provider Department Center  04/29/2024 10:00 AM Reda, Cathlean Ang, NP [facility] [place]  05/18/2024 10:45 AM Neill Krystal Fallow, DPM [facility] [place] [facility]  07/06/2024 11:20 AM Brothers, Jenna Nicole, PA [place]  07/26/2024 10:00 AM Skeet Busing, PA [facility]    Follow-up info: [facility] [address] [place]  [address] [phone]        Allergies/Intolerances:  Allergies  Allergen Reactions   Oxycontin  [Oxycodone ] Other (See Comments)    Other Reaction: Other reaction, hallucinations    Latex, Natural Rubber Hives   Prednisone Other (See Comments)    Memory loss     New Adverse Drug Events: none  Medications:     Current Discharge Medication List     START taking these medications      Instructions  amLODIPine  5 MG tablet Quantity: 30 tablet Refills: 0  Commonly known as: NORVASC  Take 1 tablet (5 mg total) by mouth once daily Last time this was given: 5 mg on March 24, 2024 10:21 AM   carvedilol  3.125 MG tablet Quantity: 60 tablet Refills: 0  Commonly known as: COREG  Take 1 tablet (3.125 mg total) by mouth 2 (two) times daily with meals Last time this was given: 3.125 mg on March 24, 2024 10:20 AM   ELIQUIS  5 mg tablet Quantity: 60 tablet Refills: 0 Generic drug: apixaban   Take 1 tablet (5 mg total) by mouth every 12 (twelve) hours Last time this was given: 5 mg on March 24, 2024  8:57 AM       These medications have been CHANGED  Instructions  insulin  ASPART pen injector (concentration 100 units/mL) Quantity: 30 mL Refills: 0 What changed: additional instructions  Commonly known as: NovoLOG  FLEXPEN Inject 3 units subcutaneously three times daily  with meals. Use correction scale as directed by [facility].       CONTINUE taking these medications      Instructions  aspirin  81 MG EC tablet Refills: 0  Take 81 mg by mouth once daily Last time this was given: 81 mg on March 24, 2024  8:58 AM   BASAGLAR  KWIKPEN U-100 INSULIN  pen injector (concentration 100 units/mL) Quantity: 15 mL Refills: 2 Generic drug: insulin  GLARGINE  Inject 32 units subcutaneously once daily. Doctor's comments: If insurance won't pay for basaglar  pen can they pay for lantus  vial or pens? Last time this was given: Ask your nurse or doctor   benazepril  10 MG tablet Quantity: 90 tablet Refills: 1  Commonly known as: LOTENSIN  TAKE 1 TABLET BY MOUTH EVERY DAY   blood-glucose meter Misc Quantity: 1 each Refills: 1  Commonly known as: ONETOUCH ULTRA2 METER as directed (Daily)   brimonidine  0.2 % ophthalmic solution Quantity: 5 mL Refills: 12  Commonly known as: ALPHAGAN  Place 1 drop into both eyes 3 (three) times daily Last time this was given: 1 drop on March 24, 2024 12:01 PM   buPROPion  300 MG XL tablet Quantity: 90 tablet Refills: 1  Commonly known as: WELLBUTRIN  XL TAKE 1 TABLET BY MOUTH EVERY DAY Last time this was given: 300 mg on March 24, 2024  8:57 AM   calcium carbonate 300 mg (750 mg) chewable tablet Refills: 0  Commonly known as: TUMS E-X Take 1 tablet by mouth once daily   compress.stocking,knee,reg,med Misc Quantity: 2 each Refills: 3  Open toe, 30-40 compression stockings. Small for left leg and medium for right   cyanocobalamin 1000 MCG tablet Refills: 0  Commonly known as: VITAMIN B12 Take 1,000 mcg by mouth once daily   DEXCOM G7 SENSOR Devi Quantity: 9 each Refills: 3  Use 1 Device every 10 (ten) days   DOCOSAHEXANOIC ACID ORAL Refills: 0  Take 2 capsules by mouth 2 (two) times daily   escitalopram  oxalate 10 MG tablet Quantity: 135 tablet Refills: 1  Commonly known as: LEXAPRO  TAKE 1 AND  1/2 TABLETS BY MOUTH ONCE DAILY Last time this was given: 15 mg on March 24, 2024  8:59 AM   finasteride  5 mg tablet Quantity: 90 tablet Refills: 1  Commonly known as: PROSCAR  TAKE 1 TABLET BY MOUTH EVERY DAY Last time this was given: 5 mg on March 24, 2024  8:57 AM   fluticasone  propionate 50 mcg/actuation nasal spray Quantity: 48 g Refills: 1  Commonly known as: FLONASE  SPRAY 2 SPRAYS INTO EACH NOSTRIL EVERY DAY   FUROsemide  20 MG tablet Quantity: 90 tablet Refills: 1  Commonly known as: LASIX  Take 1 tablet (20 mg total) by mouth once daily Doctor's comments: Please note new sig Last time this was given: 20 mg on March 24, 2024  8:57 AM   gabapentin  300 MG capsule Quantity: 180 capsule Refills: 1  Commonly known as: NEURONTIN  Take 1 capsule (300 mg total) by mouth 2 (two) times daily Last time this was given: 300 mg on March 23, 2024  8:53 PM   glucagon 3 mg/actuation nasal spray Refills: 0  Commonly known as: BAQSIMI 3 mg once   hydrocortisone 2.5 % cream Refills: 0  APPLY TO AFFECTED SCALY AREAS ON FACE TWICE DAILY AS NEEDED  JARDIANCE 25 mg tablet Refills: 0 Generic drug: empagliflozin  Take 25 mg by mouth once daily   ketoconazole 2 % shampoo Refills: 0  Commonly known as: NIZORAL Apply topically once daily as needed   magnesium oxide 400 mg (241.3 mg magnesium) tablet Refills: 0  Commonly known as: MAG-OX Take 400 mg by mouth once daily Last time this was given: Ask your nurse or doctor   multivitamin tablet Refills: 0  Take 1 tablet by mouth once daily   nystatin  100,000 unit/gram ointment Quantity: 60 g Refills: 0  Commonly known as: MYCOSTATIN  Apply topically 2 (two) times daily   ONETOUCH ULTRA TEST test strip Quantity: 300 strip Refills: 7 Generic drug: blood glucose diagnostic  3 (three) times daily Doctor's comments: DX Code Needed  .   pantoprazole  40 MG DR tablet Quantity: 90 tablet Refills: 1  Commonly known as:  PROTONIX  TAKE 1 TABLET BY MOUTH EVERY DAY Last time this was given: 40 mg on March 24, 2024  8:57 AM   pen needle, diabetic 32 gauge x 5/32 Ndle Quantity: 100 each Refills: 3  Commonly known as: BD ULTRA-FINE NANO PEN NEEDLE Use daily as directed   polyethylene glycol packet Refills: 0  Commonly known as: MIRALAX Take 1 packet (17 g total) by mouth once daily as needed for Constipation Mix in 4-8ounces of fluid prior to taking.   pravastatin  40 MG tablet Quantity: 90 tablet Refills: 3  Commonly known as: PRAVACHOL  Take 1 tablet (40 mg total) by mouth once daily Last time this was given: 40 mg on March 24, 2024  8:57 AM   sennosides-docusate 8.6-50 mg tablet Refills: 0  Commonly known as: SENOKOT-S Take 2 tablets by mouth 2 (two) times daily   spironolactone  25 MG tablet Refills: 0  Commonly known as: ALDACTONE  Take 25 mg by mouth once daily Last time this was given: 25 mg on March 24, 2024  8:58 AM   tamsulosin  0.4 mg capsule Quantity: 90 capsule Refills: 1  Commonly known as: FLOMAX  TAKE 1 CAPSULE (0.4 MG TOTAL) BY MOUTH ONCE DAILY. TAKE 30 MINUTES AFTER SAME MEAL EACH DAY.   TRULICITY 0.75 mg/0.5 mL subcutaneous pen injector Refills: 0 Generic drug: dulaglutide  Inject 0.75 mg subcutaneously once a week       STOP taking these medications    dabigatran  75 mg capsule Commonly known as: PRADAXA          Brief History of Present Illness:   Mr. Dirk is an 84 year old man with a complex medical history including CAD s/p CABG, paroxysmal AF on dabigatran , T2DM, CKD3, HLD, HTN, DISH with extensive spinal surgeries, cauda equina decompression, BPH, urinary incontinence, and recurrent falls. He presented with lower extremity weakness, back pain, fevers, and dysuria and suffered a PEA arrest while supine in the CT scanner, requiring 1 mg of epinephrine with return of spontaneous circulation after 10 minutes. On MICU arrival, he was febrile and hypotensive  requiring vasopressor support. Workup revealed a UTI, rhinovirus infection, and aspiration pneumonia, for which he was treated. He was extubated on 1/3 and is nasal cannula O2.    _____________________   Hospital Course by Problem:  1. Acute Hypoxic Respiratory Failure / Rhinovirus / Aspiration Pneumonia: Patient admitted with acute hypoxia, multifocal bilateral opacities on CT chest, MRSA nares positive. Treated with vancomycin  (1/1-1/4), Zosyn  (12/31-1/4), Rocephin  (1/4-1/6). Given worsened clinical status on 1/7, vancomycin  and Zosyn  were restarted. Pulmonary consulted; patient received scheduled and PRN nebulizers, Mucinex , Acapella/chest PT. Supportive respiratory hygiene  led to improvement in clinical status. Abx stopped on 01/10, and patient remained in stable condition and able to be discharged on 01/14.   2. PEA Arrest: Acute hypoxia and hypotension in CT scanner, followed by PEA arrest. ROSC achieved after 10 minutes; extubated on 1/3 without evidence of anoxic brain injury. Arrest thought secondary to respiratory compromise from aspiration, rhinovirus, and elevated left hemidiaphragm. Continue to work with [facility] PT/OT for conditioning/strengthening.   3. Septic Shock / Urinary Tract Infection:Septic shock due to UTI (pan-sensitive E. coli) and aspiration pneumonia. Managed with above antibiotics; resolved prior to discharge.  4. Heart Failure with Reduced EF / CAD / HTN / YOI:Wztob measured EF 30% with moderate RV systolic dysfunction. Continue home Lasix ; Benazepril . Spironolactone  25mg  resumed. Coreg  started at 3.125mg  (bradycardia with higher doses). Amlodipine  5mg  daily restarted. Continue home aspirin  and statin.  5. Right Femoral Vein Thrombus: Seen on US ; patient on dabigatran  prior to admission with likely failure, transitioned to Eliquis .  6. Rib Fractures (Left 4th and 5th):Likely from chest compressions. Continue incentive spirometer and lidocaine  patches for pain.  7. AKI on  CKD: Resolved during admission.  8. Paroxysmal Afib: Continue Eliquis .  9. Chronic Spine and Neurologic Issues: Extensive prior spinal surgery history (M[place]za PSF C1-4, ASF C6-7, PSF T10-L5, cauda equina decompression, C6-T2 laminectomy 07/2023). Presented with acute-on-chronic back pain, LE weakness, and falls; imaging shows no acute spinal cord compression. Continue gabapentin  300 mg daily, acetaminophen  q8h; MRI as an outpatient when his respiratory status continues to improve and his risk of aspiration and arrest is less.   10. T2DM:On Glargine 32 units QHS and ISS with meals at home; discharged on this regimen.   11. Depression:Continue Lexapro  and Bupropion .  12. GERD:Continue home PPI.  13. Thyroid nodule: seen incidentally on imaging. Needs US  outpatient to follow-up.   Obesity (BMI 30-39): His body mass index is 31.72 kg/m. This diagnosis affected the hospital stay due to increased nursing care, therapy services, and different equipment needed for care. This diagnosis was associated with higher rates of medical complications such as respiratory issues, sleep disorders, and skin breakdown.  Social Drivers of Health with Concerns   Tobacco Use: Low Risk (12/20/2023)   [facility]   Patient History    Smokeless Tobacco Use: Never    Smoking Tobacco Use: Never  Recent Concern: Tobacco Use - Medium Risk (11/17/2023)   Patient History    Smoking Tobacco Use: Former    Smokeless Tobacco Use: Former    Passive Exposure: Past  Stress: Stress Concern Present (08/13/2023)   [employer] of Occupational Health - Occupational Stress Questionnaire    Feeling of Stress : To some extent    Surgeries and Procedures Performed:  None  _____________________  Discharge Exam:  BP (!) 144/57 (BP Location: Right upper arm, Patient Position: Lying)   Pulse 69   Temp 36.6 C (97.8 F) (Axillary)   Resp 18   Ht 167.6 cm (5' 5.98)   Wt 89.1 kg (196 lb 6.9 oz) Comment:  with 3 pillows  SpO2 93%   BMI 31.72 kg/m  O2 Device: Nasal cannula (03/24/24 9071) Gen: appears stated age, NAD HEENT: normocephalic, anicteric sclera, clear OP, [place] in place CV: RRR, no m/r/g, no edema Pulm: non-labored respirations, CTAB, no wheezing ABD: +BS, soft, non-tender, non-distended EXT: warm, well perfused, moving lower and upper ext, generalized weakness   Pertinent Lab Testing: Recent Labs  Lab 03/20/24 0439 03/21/24 0544 03/22/24 0418  NA 138 134* 136  K 3.6 3.7 3.4*  CL 99 99 99  CO2 29 27 30   BUN 17 17 16   CREATININE 1.2 1.1 1.1  GLUCOSE 146* 134 142*  CALCIUM 8.8 8.7 8.8   No results for input(s): AST, ALT, ALKPHOS, TBILI in the last 168 hours.   Recent Labs  Lab 03/19/24 0517 03/21/24 0544 03/22/24 0418  WBC 9.6 12.7* 11.1*  HGB 9.2* 9.4* 8.8*  HCT 29.4* 30.4* 29.0*  PLT 371 389 406   No results for input(s): APTT, INR in the last 168 hours.     Micro:  Susceptibility data from last 90 days. Collected Specimen Info Organism Amikacin Amoxicillin  + Clavulanic Acid Ampicillin Ampicillin + Sulbactam Cefazolin  Cefazolin  (Cystitis Only) Cefuroxime (Oral) Cefuroxime (Parenteral) Ciprofloxacin Gentamicin Levofloxacin  03/11/24 Other from Sputum Oropharyngeal flora             03/10/24 Urine from Clean Catch (Voided mid-stream sample) Escherichia coli  S  S  S  S  S  S  S  S  I  S  S   Collected Specimen Info Organism Nitrofurantoin  Piperacillin  + Tazobactam Tetracycline Tobramycin Trimeth /Sulfa   03/11/24 Other from Sputum Oropharyngeal flora       03/10/24 Urine from Clean Catch (Voided mid-stream sample) Escherichia coli  S  S  S  S  S       Pertinent Imaging:   Results for orders placed during the hospital encounter of 12/31/25Heres a concise **Discharge Imaging Summary** suitable for [facility] - Inpatient documentation:  ---    CT imaging of the chest, thoracoabdominal aorta, cervical spine, and echocardiogram were obtained during this  hospitalization. **No pulmonary embolism or acute aortic syndrome** was identified. Chest imaging demonstrated **bilateral lung consolidations and peribronchial groundglass opacities**, most prominent in the dependent portions and upper lobes, consistent with ongoing infection and possible aspiration; **left lower lobe atelectasis and small pleural effusion** were unchanged. **Acute bilateral anterior rib fractures** were noted. A **large heterogeneous left thyroid nodule (24 mm)** was identified, recommended for follow-up ultrasound. Extensive **cervical and thoracolumbar spinal fusion hardware** was intact, with no acute fracture, but advanced degenerative changes and multifocal canal/foraminal stenosis were present; evaluation limited by hardware artifact. **Echocardiogram revealed moderate biventricular systolic dysfunction** (EF 30%) with mild LV hypertrophy, mild tricuspid regurgitation, grade 1 diastolic dysfunction, and no intracardiac shunt on bubble study.   ___________  Code Status: Full Code Goals of care were addressed during this admission: maintain full code.   Status on Discharge:  Current activity: Bedfast (03/24/24 0900) Current mobility: Very limited (03/24/24 0900)  Activity Recommendation: advance activity as tolerated. Currently requiring max assistance for out of bed, hoyer lift.   Other Discharge Instructions: Services setup at discharge: Home Health PT/OT, Home Health Aide, and Equipment: already had needed equipment of mechanical lift, wheelchair Tubes/lines at discharge: None  Diet: Oral Supplements - Adult All Supplements; Ensure Plus High Protein-Assorted; Breakfast and Dinner Diet carbohydrate level 2 (60 gm/meal) Oral Supplements - Adult All Supplements; Magic Cup-Assorted; Dinner Wound Care Order Instructions     None       _____________________  Time spent on discharge process: 45 minutes    Pauline Blanch, MD  [facility]   03/24/2024   Hospital Contact Information:  [place] [facility]) [facility]) [employer] [facility])  Pending tests:  Laboratory: [phone] Microbiology: [phone] Pathology: [phone] Radiology: [phone]  General questions: [phone] Pending tests: Laboratory: [phone] Microbiology: [phone] Pathology: [phone] Radiology: [phone])  [phone]  General questions:  [phone] Pending tests:  Laboratory: [phone] Microbiology: [phone] Pathology: [phone] Radiology: [phone]  General questions:  [phone] Pending tests: Laboratory: [phone] Microbiology: [phone] Pathology: [phone] Radiology: [phone]  General questions:  [phone]    "

## 2024-03-23 NOTE — Progress Notes (Signed)
 " Physical Therapy Progress Note  Patient Name:  Derek Andersen Date of Evaluation: 03/23/24 Time of Evaluation:  0930 Duration of Session:  15 Minutes Room/Bed: 8E11/8E11-01  Precautions: Falls Risk, O2 Saturation, Orthostatic hypotension, Back Precautions, Isolation, Other Isolation Precautions: Contact, Droplet   Assessment: Today, patient engaged in cough assist with no adverse reactions. PT to d/c cough assist as pt able to cough on his own now and able to wean to room air with SPO2 stable. PT to focus more on mobility now as a means of airway clearance. Pt confirmed if he was to d/c home he will have 10hrs of assistance with aides and then will rely on family as needed.   Patient will benefit from subacute rehab prior to returning home with support. The patient will continue to benefit from the skills of a physical therapist to address impairments in Impaired functional mobility, Inadequate Airway clearance, Decreased strength, Decreased endurance/activity tolerance, Decreased ROM/flexibility, Gait/Ambulation limitations, Pain, Decreased transfers, Decreased bed mobility.   Discharge Recommendations:  The physical therapy discharge recommendation is Skilled care (additional rehab) with PT to address Impaired functional mobility, Inadequate Airway clearance, Decreased strength, Decreased endurance/activity tolerance, Decreased ROM/flexibility, Gait/Ambulation limitations, Pain, Decreased transfers, Decreased bed mobility.   Barriers to returning home include History of falls, Low activity tolerance, Decreased activity tolerance/medically complex/comorbidities, Supplemental oxygen needs   If post-acute rehabilitation options are not available, family training is recommended and the patient will require the following to return home: ambulance transport, HHOT, HHPT, HH aide, assistance with all functional mobility, first floor setup, and ramped entrance DME Recommendations    Flowsheet Row  Most Recent Value  DME Recommendations Hospital Bed, Lift Equipment, Mobility Equipment Filed at 03/23/2024 0930  Mobility aid                                         Wheelchair Filed at 03/23/2024 0930       Wheelchair type Power Filed at 03/23/2024 0930  Lift equipment Dependent Filed at 03/23/2024 0930  Hospital bed Semi-electric Filed at 03/23/2024 0930   Patient would benefit from a standard wheelchair (w/c) at home and demonstrates a mobility limitation that significantly impairs his/her ability to participate in MRADLs at home, which cannot be sufficiently resolved by the use of a fitted cane or walker. Use of the w/c on a regular basis in the home will significantly improve the patient's MRADL function and safety, the patient/family is/are willing and agreeable to using the w/c to improve MRADL function and safety at home, and the patient/family demonstrate the willingness and physical/mental capabilities to safely propel the w/c at home. The patient would benefit from an semi-electric hospital bed with rails as the patient has a medical condition which requires frequent repositioning of the body in ways not feasible with an ordinary bed. The patient needs frequent changes in body position as a means to alleviate pain, with head of bed to be elevated more than 30 degrees most of the time due to CHF, COPD or problems with aspiration, and to prevent skin breakdown. Furthermore, the patient requires a bed height different than a fixed height hospital bed to permit safe transfers to chair, wheelchair or standing position.    The patient transfer between bed and a chair, wheelchair, or commode is required; Without the use of a lift, the beneficiary would be bed confined  Recommendations for mobility with  nursing:  Dependent lift; Bed in chair position. Use BMAT score and associated clinical judgement to determine safe mobility on a daily basis as patient status may be subject to change.  Complete  details of today's session: Patient's medical chart reviewed and RN cleared patient for session. Pt received semi-reclined in bed and agreeable to treatment session.  PT facilitated pt in the following functional activities during evaluation: cough assist . Please see flow sheet for further details of today's session. At end of session, the patient was left semi-reclined in bed, with all needs in reach, with nurse call device in reach, with bed/chair alarm system engaged. His status was communicated to the RN.   Session Information:   03/23/24 0930  Discipline Timestamp  Discipline Timestamp PT  Documentation Type     Documentation Type                                E,R, T   Treatment  Patient Subjective Information  Patient Subjective Information Patient agreeable to therapy  Precautions  Precautions Falls Risk;O2 Saturation;Orthostatic hypotension;Back Precautions;Isolation;Other  Isolation Precautions Contact;Droplet  Patient/Family Goals  Patient/Family Goals Go home;Return to previous lifestyle  Pain Assessment  Pain Assessment %% 0-10 (did not rate)  Activity At Time Of Vitals Measurement  Activity Rest  Review of Systems Cardiovascular/Pulmonary Function  SpO2 94 %  Any supplemental oxygen? No  Review of Systems - Cognition                         Overall Cognitive Status WNL  Chest PT Pre-Treatment Information  Pre-Treatment Heart Rate 94  Pre-Treatment Respirations 15  Pre-Treatment SpO2 (%) 94  Airway Clearance Treatment Performed  Cough assist Yes  Mode Automatic  Cough Trak On  Inhale Pressure (cmH2O) 30  Inhale Flow Medium  Exhale Pressure (cmH2O) -35  Inspiration Time (sec) 2  Expiration Time (sec) 2  Pause Time (sec) 0  Coughs 3  Cycles 3  Oscillation Both  Inhale Frequency (Hz) 10  Inhale Amplitude (cmH2O) 10  Exhale Frequency (Hz) 10  Exhale Amplitude (cmH2O) 10  Suctioning / Secretions  Suction Type Oral  Suction Device  Yankauer  Secretion Amount  Scant  Secretion Color White  Secretion Consistency Thick  Suction Tolerance Tolerated well  Suctioning Adverse Effects None  Chest PT Post Treatment Assessment  Post-Treatment Heart Rate 98  Post-Treatment Respirations 26  Post-Treatment SpO2 (%) 94  AM-PAC 6 Clicks Basic Mobility Inpatient Short Form  Turning from your back to your side while in a flat bed without using bedrails? 2-A Lot  Moving from lying on your back to sitting on the side of a flat bed without using bedrails? 2-A Lot  Moving to and from a bed to a chair (including a wheelchair)? 1-Total  Standing up from a chair using using your arms (e.g,. wheelchair, or bedside chair)? 1-Total  To walk in hospital room? 1-Total  Climbing 3-5 steps with a railing? 1-Total  AM-PAC Basic Mobility Raw Score 8  AM-PAC Basic Mobility t-Scale Score 22.61  AM-PAC Basic Mobility G-Code Modifier CM  Patient Status At End of Session  Status Communicated to: RN  Pt Left: semi-reclined in bed;with all needs in reach;with nurse call device in reach;with bed/chair alarm system engaged  Assessment  Patient safe for DC/PT perspective? Yes  Summary of Findings  Tolerated treatment well  Patient Demonstrates Decreased Function Secondary to  Medical status limitations;Decreased activity tolerance  Plan  PT Frequency 3x per week  Discharge Recommendation ([facility]) Skilled care (additional rehab) with PT  DME Recommendations Hospital Bed;Lift Equipment;Academic Librarian type Power  Hospital bed Semi-electric  Lift equipment Dependent  Plan(Progress Note) Continue current plan of care    Brief Summary of the Patient's Current Mobility Status: The patient requires 2-A Lot assistance to turn from back to side in a flat bed. The patient requires 2-A Lot assistance to move from lying on back to sitting on the edge of the bed. The patient requires 1-Total  assistance to move from bed to chair. The patient requires 1-Total assistance to stand up from a chair using arms. The patient requires 1-Total assistance to walk in the hospital room. The patient requires 1-Total assistance to climb 3-5 stairs with a railing.   Please see patient education record for PT teaching completed today.  LAYMON PASTURES, PT, DPT  Pager [phone]   "

## 2024-03-24 ENCOUNTER — Encounter: Payer: Self-pay | Admitting: *Deleted

## 2024-03-24 ENCOUNTER — Emergency Department

## 2024-03-24 ENCOUNTER — Other Ambulatory Visit: Payer: Self-pay

## 2024-03-24 DIAGNOSIS — Z7901 Long term (current) use of anticoagulants: Secondary | ICD-10-CM

## 2024-03-24 DIAGNOSIS — I82411 Acute embolism and thrombosis of right femoral vein: Secondary | ICD-10-CM | POA: Diagnosis present

## 2024-03-24 DIAGNOSIS — Z885 Allergy status to narcotic agent status: Secondary | ICD-10-CM

## 2024-03-24 DIAGNOSIS — Z8674 Personal history of sudden cardiac arrest: Secondary | ICD-10-CM

## 2024-03-24 DIAGNOSIS — I2510 Atherosclerotic heart disease of native coronary artery without angina pectoris: Secondary | ICD-10-CM | POA: Diagnosis present

## 2024-03-24 DIAGNOSIS — G4733 Obstructive sleep apnea (adult) (pediatric): Secondary | ICD-10-CM | POA: Diagnosis present

## 2024-03-24 DIAGNOSIS — Z79899 Other long term (current) drug therapy: Secondary | ICD-10-CM

## 2024-03-24 DIAGNOSIS — E66811 Obesity, class 1: Secondary | ICD-10-CM | POA: Diagnosis present

## 2024-03-24 DIAGNOSIS — M549 Dorsalgia, unspecified: Secondary | ICD-10-CM | POA: Diagnosis present

## 2024-03-24 DIAGNOSIS — I5023 Acute on chronic systolic (congestive) heart failure: Secondary | ICD-10-CM | POA: Diagnosis present

## 2024-03-24 DIAGNOSIS — G8929 Other chronic pain: Secondary | ICD-10-CM | POA: Diagnosis present

## 2024-03-24 DIAGNOSIS — N184 Chronic kidney disease, stage 4 (severe): Secondary | ICD-10-CM | POA: Diagnosis present

## 2024-03-24 DIAGNOSIS — J9621 Acute and chronic respiratory failure with hypoxia: Secondary | ICD-10-CM | POA: Diagnosis present

## 2024-03-24 DIAGNOSIS — J189 Pneumonia, unspecified organism: Secondary | ICD-10-CM | POA: Diagnosis present

## 2024-03-24 DIAGNOSIS — Z85828 Personal history of other malignant neoplasm of skin: Secondary | ICD-10-CM

## 2024-03-24 DIAGNOSIS — Z91040 Latex allergy status: Secondary | ICD-10-CM

## 2024-03-24 DIAGNOSIS — Z955 Presence of coronary angioplasty implant and graft: Secondary | ICD-10-CM

## 2024-03-24 DIAGNOSIS — Z794 Long term (current) use of insulin: Secondary | ICD-10-CM

## 2024-03-24 DIAGNOSIS — I480 Paroxysmal atrial fibrillation: Secondary | ICD-10-CM | POA: Diagnosis present

## 2024-03-24 DIAGNOSIS — L89152 Pressure ulcer of sacral region, stage 2: Secondary | ICD-10-CM | POA: Diagnosis present

## 2024-03-24 DIAGNOSIS — F419 Anxiety disorder, unspecified: Secondary | ICD-10-CM | POA: Diagnosis present

## 2024-03-24 DIAGNOSIS — Z9981 Dependence on supplemental oxygen: Secondary | ICD-10-CM

## 2024-03-24 DIAGNOSIS — N400 Enlarged prostate without lower urinary tract symptoms: Secondary | ICD-10-CM | POA: Diagnosis present

## 2024-03-24 DIAGNOSIS — L89311 Pressure ulcer of right buttock, stage 1: Secondary | ICD-10-CM | POA: Diagnosis present

## 2024-03-24 DIAGNOSIS — F32A Depression, unspecified: Secondary | ICD-10-CM | POA: Diagnosis present

## 2024-03-24 DIAGNOSIS — Z6832 Body mass index (BMI) 32.0-32.9, adult: Secondary | ICD-10-CM

## 2024-03-24 DIAGNOSIS — Z888 Allergy status to other drugs, medicaments and biological substances status: Secondary | ICD-10-CM

## 2024-03-24 DIAGNOSIS — Z951 Presence of aortocoronary bypass graft: Secondary | ICD-10-CM

## 2024-03-24 DIAGNOSIS — Z7984 Long term (current) use of oral hypoglycemic drugs: Secondary | ICD-10-CM

## 2024-03-24 DIAGNOSIS — N179 Acute kidney failure, unspecified: Secondary | ICD-10-CM | POA: Diagnosis present

## 2024-03-24 DIAGNOSIS — Z87442 Personal history of urinary calculi: Secondary | ICD-10-CM

## 2024-03-24 DIAGNOSIS — S2242XD Multiple fractures of ribs, left side, subsequent encounter for fracture with routine healing: Secondary | ICD-10-CM

## 2024-03-24 DIAGNOSIS — E785 Hyperlipidemia, unspecified: Secondary | ICD-10-CM | POA: Diagnosis present

## 2024-03-24 DIAGNOSIS — E1122 Type 2 diabetes mellitus with diabetic chronic kidney disease: Secondary | ICD-10-CM | POA: Diagnosis present

## 2024-03-24 DIAGNOSIS — I252 Old myocardial infarction: Secondary | ICD-10-CM

## 2024-03-24 DIAGNOSIS — Z7982 Long term (current) use of aspirin: Secondary | ICD-10-CM

## 2024-03-24 DIAGNOSIS — E1165 Type 2 diabetes mellitus with hyperglycemia: Secondary | ICD-10-CM | POA: Diagnosis present

## 2024-03-24 DIAGNOSIS — I130 Hypertensive heart and chronic kidney disease with heart failure and stage 1 through stage 4 chronic kidney disease, or unspecified chronic kidney disease: Principal | ICD-10-CM | POA: Diagnosis present

## 2024-03-24 DIAGNOSIS — Z66 Do not resuscitate: Secondary | ICD-10-CM | POA: Diagnosis present

## 2024-03-24 DIAGNOSIS — X58XXXD Exposure to other specified factors, subsequent encounter: Secondary | ICD-10-CM | POA: Diagnosis present

## 2024-03-24 DIAGNOSIS — Z8616 Personal history of COVID-19: Secondary | ICD-10-CM

## 2024-03-24 LAB — PROTIME-INR
INR: 1.1 (ref 0.8–1.2)
Prothrombin Time: 14.9 s (ref 11.4–15.2)

## 2024-03-24 LAB — CBC
HCT: 31.3 % — ABNORMAL LOW (ref 39.0–52.0)
Hemoglobin: 9.7 g/dL — ABNORMAL LOW (ref 13.0–17.0)
MCH: 29 pg (ref 26.0–34.0)
MCHC: 31 g/dL (ref 30.0–36.0)
MCV: 93.7 fL (ref 80.0–100.0)
Platelets: 479 K/uL — ABNORMAL HIGH (ref 150–400)
RBC: 3.34 MIL/uL — ABNORMAL LOW (ref 4.22–5.81)
RDW: 14.6 % (ref 11.5–15.5)
WBC: 18.6 K/uL — ABNORMAL HIGH (ref 4.0–10.5)
nRBC: 0 % (ref 0.0–0.2)

## 2024-03-24 LAB — BASIC METABOLIC PANEL WITH GFR
Anion gap: 7 (ref 5–15)
BUN: 23 mg/dL (ref 8–23)
CO2: 30 mmol/L (ref 22–32)
Calcium: 9.5 mg/dL (ref 8.9–10.3)
Chloride: 99 mmol/L (ref 98–111)
Creatinine, Ser: 1.13 mg/dL (ref 0.61–1.24)
GFR, Estimated: >60 mL/min
Glucose, Bld: 230 mg/dL — ABNORMAL HIGH (ref 70–99)
Potassium: 4.6 mmol/L (ref 3.5–5.1)
Sodium: 137 mmol/L (ref 135–145)

## 2024-03-24 LAB — TROPONIN T, HIGH SENSITIVITY: Troponin T High Sensitivity: 43 ng/L — ABNORMAL HIGH (ref 0–19)

## 2024-03-24 NOTE — ED Triage Notes (Signed)
 Pt brought in via ems from home.  Pt released from [facility] 3 hours ago.  Pt has sob and a cough.  Hx CARDIAC ARREST on Mar 11, 2024.   Denies chest pain. Iv in place.  Pt alert. Pt on stretcher in triage.

## 2024-03-25 ENCOUNTER — Encounter: Payer: Self-pay | Admitting: Internal Medicine

## 2024-03-25 ENCOUNTER — Other Ambulatory Visit: Payer: Self-pay

## 2024-03-25 ENCOUNTER — Inpatient Hospital Stay
Admission: EM | Admit: 2024-03-25 | Discharge: 2024-03-28 | DRG: 291 | Disposition: A | Discharge status: Home Health | Attending: Internal Medicine | Admitting: Internal Medicine

## 2024-03-25 DIAGNOSIS — E1165 Type 2 diabetes mellitus with hyperglycemia: Secondary | ICD-10-CM

## 2024-03-25 DIAGNOSIS — I82409 Acute embolism and thrombosis of unspecified deep veins of unspecified lower extremity: Secondary | ICD-10-CM

## 2024-03-25 DIAGNOSIS — G8929 Other chronic pain: Secondary | ICD-10-CM

## 2024-03-25 DIAGNOSIS — J188 Other pneumonia, unspecified organism: Secondary | ICD-10-CM | POA: Diagnosis not present

## 2024-03-25 DIAGNOSIS — I480 Paroxysmal atrial fibrillation: Secondary | ICD-10-CM

## 2024-03-25 DIAGNOSIS — E669 Obesity, unspecified: Secondary | ICD-10-CM

## 2024-03-25 DIAGNOSIS — N179 Acute kidney failure, unspecified: Secondary | ICD-10-CM | POA: Diagnosis present

## 2024-03-25 DIAGNOSIS — I5023 Acute on chronic systolic (congestive) heart failure: Principal | ICD-10-CM

## 2024-03-25 DIAGNOSIS — J9621 Acute and chronic respiratory failure with hypoxia: Secondary | ICD-10-CM

## 2024-03-25 DIAGNOSIS — F418 Other specified anxiety disorders: Secondary | ICD-10-CM

## 2024-03-25 DIAGNOSIS — S2249XA Multiple fractures of ribs, unspecified side, initial encounter for closed fracture: Secondary | ICD-10-CM

## 2024-03-25 DIAGNOSIS — I82411 Acute embolism and thrombosis of right femoral vein: Secondary | ICD-10-CM

## 2024-03-25 DIAGNOSIS — L8990 Pressure ulcer of unspecified site, unspecified stage: Secondary | ICD-10-CM | POA: Insufficient documentation

## 2024-03-25 DIAGNOSIS — J690 Pneumonitis due to inhalation of food and vomit: Secondary | ICD-10-CM | POA: Diagnosis present

## 2024-03-25 LAB — CBG MONITORING, ED: Glucose-Capillary: 143 mg/dL — ABNORMAL HIGH (ref 70–99)

## 2024-03-25 LAB — RESP PANEL BY RT-PCR (RSV, FLU A&B, COVID)  RVPGX2
Influenza A by PCR: NEGATIVE
Influenza B by PCR: NEGATIVE
Resp Syncytial Virus by PCR: NEGATIVE
SARS Coronavirus 2 by RT PCR: NEGATIVE

## 2024-03-25 LAB — GLUCOSE, CAPILLARY
Glucose-Capillary: 229 mg/dL — ABNORMAL HIGH (ref 70–99)
Glucose-Capillary: 196 mg/dL — ABNORMAL HIGH (ref 70–99)
Glucose-Capillary: 205 mg/dL — ABNORMAL HIGH (ref 70–99)
Glucose-Capillary: 207 mg/dL — ABNORMAL HIGH (ref 70–99)

## 2024-03-25 LAB — LACTIC ACID, PLASMA: Lactic Acid, Venous: 0.9 mmol/L (ref 0.5–1.9)

## 2024-03-25 LAB — MRSA NEXT GEN BY PCR, NASAL: MRSA by PCR Next Gen: DETECTED — AB

## 2024-03-25 LAB — TROPONIN T, HIGH SENSITIVITY: Troponin T High Sensitivity: 48 ng/L — ABNORMAL HIGH (ref 0–19)

## 2024-03-25 LAB — HEMOGLOBIN A1C
Hgb A1c MFr Bld: 7 % — ABNORMAL HIGH (ref 4.8–5.6)
Mean Plasma Glucose: 154.2 mg/dL

## 2024-03-25 LAB — PRO BRAIN NATRIURETIC PEPTIDE: Pro Brain Natriuretic Peptide: 5760 pg/mL — ABNORMAL HIGH

## 2024-03-25 LAB — PROCALCITONIN: Procalcitonin: 0.53 ng/mL

## 2024-03-25 MED ORDER — GUAIFENESIN-DM 100-10 MG/5ML PO SYRP
10.0000 mL | ORAL_SOLUTION | ORAL | Status: DC | PRN
  Administered 2024-03-27: 10 mL via ORAL
  Filled 2024-03-25: qty 10

## 2024-03-25 MED ORDER — ONDANSETRON HCL 4 MG PO TABS: 4.0000 mg | ORAL_TABLET | Freq: Four times a day (QID) | ORAL | Status: DC | PRN

## 2024-03-25 MED ORDER — INSULIN ASPART 100 UNIT/ML IJ SOLN
0.0000 [IU] | Freq: Three times a day (TID) | INTRAMUSCULAR | Status: DC
  Administered 2024-03-25: 3 [IU] via SUBCUTANEOUS
  Administered 2024-03-25: 5 [IU] via SUBCUTANEOUS
  Administered 2024-03-25 – 2024-03-26 (×2): 2 [IU] via SUBCUTANEOUS
  Administered 2024-03-26 (×2): 5 [IU] via SUBCUTANEOUS
  Administered 2024-03-27: 3 [IU] via SUBCUTANEOUS
  Administered 2024-03-27 (×2): 8 [IU] via SUBCUTANEOUS
  Administered 2024-03-28: 3 [IU] via SUBCUTANEOUS
  Filled 2024-03-25: qty 3
  Filled 2024-03-25 (×3): qty 5
  Filled 2024-03-25 (×2): qty 2
  Filled 2024-03-25: qty 8
  Filled 2024-03-25: qty 3
  Filled 2024-03-25: qty 5
  Filled 2024-03-25: qty 8

## 2024-03-25 MED ORDER — IPRATROPIUM-ALBUTEROL 0.5-2.5 (3) MG/3ML IN SOLN
3.0000 mL | Freq: Two times a day (BID) | RESPIRATORY_TRACT | Status: DC
  Administered 2024-03-25 – 2024-03-28 (×5): 3 mL via RESPIRATORY_TRACT
  Filled 2024-03-25 (×5): qty 3

## 2024-03-25 MED ORDER — FUROSEMIDE 10 MG/ML IJ SOLN
40.0000 mg | Freq: Once | INTRAMUSCULAR | Status: AC
  Administered 2024-03-25: 40 mg via INTRAVENOUS
  Filled 2024-03-25: qty 4

## 2024-03-25 MED ORDER — PRAVASTATIN SODIUM 20 MG PO TABS
40.0000 mg | ORAL_TABLET | Freq: Every day | ORAL | Status: DC
  Administered 2024-03-25 – 2024-03-28 (×4): 40 mg via ORAL
  Filled 2024-03-25 (×4): qty 2

## 2024-03-25 MED ORDER — ACETAMINOPHEN 325 MG PO TABS: 650.0000 mg | ORAL_TABLET | Freq: Four times a day (QID) | ORAL | Status: DC | PRN

## 2024-03-25 MED ORDER — AMLODIPINE BESYLATE 5 MG PO TABS: 10.0000 mg | ORAL_TABLET | Freq: Every day | ORAL | Status: DC

## 2024-03-25 MED ORDER — BUPROPION HCL ER (XL) 300 MG PO TB24
300.0000 mg | ORAL_TABLET | Freq: Every day | ORAL | Status: DC
  Administered 2024-03-25 – 2024-03-28 (×4): 300 mg via ORAL
  Filled 2024-03-25 (×4): qty 1

## 2024-03-25 MED ORDER — ACETYLCYSTEINE 20 % IN SOLN
4.0000 mL | Freq: Two times a day (BID) | RESPIRATORY_TRACT | Status: DC
  Administered 2024-03-25 – 2024-03-28 (×5): 4 mL via RESPIRATORY_TRACT
  Filled 2024-03-25 (×5): qty 4

## 2024-03-25 MED ORDER — CARVEDILOL 3.125 MG PO TABS
3.1250 mg | ORAL_TABLET | Freq: Two times a day (BID) | ORAL | Status: DC
  Administered 2024-03-25 – 2024-03-28 (×6): 3.125 mg via ORAL
  Filled 2024-03-25 (×6): qty 1

## 2024-03-25 MED ORDER — FUROSEMIDE 10 MG/ML IJ SOLN
40.0000 mg | Freq: Every day | INTRAMUSCULAR | Status: DC
  Administered 2024-03-26: 40 mg via INTRAVENOUS
  Filled 2024-03-25 (×2): qty 4

## 2024-03-25 MED ORDER — ASPIRIN 81 MG PO TBEC
81.0000 mg | DELAYED_RELEASE_TABLET | Freq: Every day | ORAL | Status: DC
  Administered 2024-03-25 – 2024-03-28 (×4): 81 mg via ORAL
  Filled 2024-03-25 (×5): qty 1

## 2024-03-25 MED ORDER — FUROSEMIDE 40 MG PO TABS: 20.0000 mg | ORAL_TABLET | Freq: Every day | ORAL | Status: DC

## 2024-03-25 MED ORDER — APIXABAN 5 MG PO TABS
5.0000 mg | ORAL_TABLET | Freq: Two times a day (BID) | ORAL | Status: DC
  Administered 2024-03-25 – 2024-03-28 (×7): 5 mg via ORAL
  Filled 2024-03-25 (×7): qty 1

## 2024-03-25 MED ORDER — INSULIN GLARGINE 100 UNIT/ML ~~LOC~~ SOLN
35.0000 [IU] | Freq: Every day | SUBCUTANEOUS | Status: DC
  Administered 2024-03-25 – 2024-03-28 (×4): 35 [IU] via SUBCUTANEOUS
  Filled 2024-03-25 (×4): qty 0.35

## 2024-03-25 MED ORDER — GABAPENTIN 300 MG PO CAPS
300.0000 mg | ORAL_CAPSULE | Freq: Two times a day (BID) | ORAL | Status: DC
  Administered 2024-03-25: 300 mg via ORAL
  Filled 2024-03-25: qty 1

## 2024-03-25 MED ORDER — INSULIN ASPART 100 UNIT/ML IJ SOLN
0.0000 [IU] | Freq: Every day | INTRAMUSCULAR | Status: DC
  Administered 2024-03-25 – 2024-03-26 (×2): 2 [IU] via SUBCUTANEOUS
  Administered 2024-03-27: 4 [IU] via SUBCUTANEOUS
  Filled 2024-03-25: qty 4
  Filled 2024-03-25 (×2): qty 2

## 2024-03-25 MED ORDER — HYDROCODONE-ACETAMINOPHEN 5-325 MG PO TABS: 1.0000 | ORAL_TABLET | ORAL | Status: DC | PRN

## 2024-03-25 MED ORDER — GABAPENTIN 300 MG PO CAPS
600.0000 mg | ORAL_CAPSULE | Freq: Every day | ORAL | Status: DC
  Administered 2024-03-25 – 2024-03-27 (×3): 600 mg via ORAL
  Filled 2024-03-25 (×3): qty 2

## 2024-03-25 MED ORDER — SPIRONOLACTONE 25 MG PO TABS
25.0000 mg | ORAL_TABLET | Freq: Every day | ORAL | Status: DC
  Administered 2024-03-25: 25 mg via ORAL
  Filled 2024-03-25: qty 1

## 2024-03-25 MED ORDER — GABAPENTIN 300 MG PO CAPS
300.0000 mg | ORAL_CAPSULE | Freq: Every day | ORAL | Status: DC
  Filled 2024-03-25: qty 1

## 2024-03-25 MED ORDER — ESCITALOPRAM OXALATE 10 MG PO TABS
15.0000 mg | ORAL_TABLET | Freq: Every day | ORAL | Status: DC
  Administered 2024-03-26: 15 mg via ORAL
  Filled 2024-03-25: qty 2

## 2024-03-25 MED ORDER — IPRATROPIUM-ALBUTEROL 0.5-2.5 (3) MG/3ML IN SOLN
3.0000 mL | Freq: Once | RESPIRATORY_TRACT | Status: AC
  Administered 2024-03-25: 3 mL via RESPIRATORY_TRACT
  Filled 2024-03-25: qty 9

## 2024-03-25 MED ORDER — ACETAMINOPHEN 650 MG RE SUPP: 650.0000 mg | Freq: Four times a day (QID) | RECTAL | Status: DC | PRN

## 2024-03-25 MED ORDER — BENAZEPRIL HCL 10 MG PO TABS: 10.0000 mg | ORAL_TABLET | Freq: Every day | ORAL | Status: DC

## 2024-03-25 MED ORDER — ONDANSETRON HCL 4 MG/2ML IJ SOLN: 4.0000 mg | Freq: Four times a day (QID) | INTRAMUSCULAR | Status: DC | PRN

## 2024-03-25 MED ORDER — VANCOMYCIN HCL 2000 MG/400ML IV SOLN
2000.0000 mg | Freq: Once | INTRAVENOUS | Status: AC
  Administered 2024-03-25: 2000 mg via INTRAVENOUS
  Filled 2024-03-25: qty 400

## 2024-03-25 MED ORDER — SODIUM CHLORIDE 0.9 % IV SOLN: 500.0000 mg | INTRAVENOUS | Status: DC

## 2024-03-25 MED ORDER — PANTOPRAZOLE SODIUM 40 MG PO TBEC
40.0000 mg | DELAYED_RELEASE_TABLET | Freq: Every day | ORAL | Status: DC
  Administered 2024-03-25 – 2024-03-28 (×4): 40 mg via ORAL
  Filled 2024-03-25 (×4): qty 1

## 2024-03-25 MED ORDER — SODIUM CHLORIDE 0.9 % IV SOLN: 2.0000 g | INTRAVENOUS | Status: DC

## 2024-03-25 MED ORDER — SODIUM CHLORIDE 0.9 % IV SOLN
100.0000 mg | Freq: Two times a day (BID) | INTRAVENOUS | Status: DC
  Administered 2024-03-25: 100 mg via INTRAVENOUS
  Filled 2024-03-25 (×2): qty 100

## 2024-03-25 MED ORDER — FINASTERIDE 5 MG PO TABS
5.0000 mg | ORAL_TABLET | Freq: Every day | ORAL | Status: DC
  Administered 2024-03-25 – 2024-03-28 (×4): 5 mg via ORAL
  Filled 2024-03-25 (×5): qty 1

## 2024-03-25 MED ORDER — TAMSULOSIN HCL 0.4 MG PO CAPS
0.4000 mg | ORAL_CAPSULE | Freq: Every day | ORAL | Status: DC
  Administered 2024-03-25 – 2024-03-27 (×3): 0.4 mg via ORAL
  Filled 2024-03-25 (×3): qty 1

## 2024-03-25 MED ORDER — SODIUM CHLORIDE 0.9 % IV SOLN
2.0000 g | Freq: Once | INTRAVENOUS | Status: AC
  Administered 2024-03-25: 2 g via INTRAVENOUS
  Filled 2024-03-25: qty 12.5

## 2024-03-25 NOTE — H&P (Signed)
 " History and Physical    Derek Andersen FMW:[id number] DOB: 01/09/1941 DOA: 03/25/2024  PCP: Diedra Lame, MD  Patient coming from: home  I have personally briefly reviewed patient's old medical records in [facility]  Chief Complaint: shortness of breath  HPI: Derek Andersen is a 84 y.o. male with medical history significant of extensive spine hardware, CAD, sCHF, DM2, Afib, recent cardiac arrest, recently started on 3L O2, who presented with shortness of breath and O2 desat.    Pt reported he was just discharged from [facility] yesterday (03/24/24).  He presented to [facility] on 03/10/24 initially for back pain, and suffered a cardiac arrest when he was laid flat for MRI scan.  Pt said due to hardware in his neck, lying flat causes him to stop breathing, and due to having received sedation for MRI, he wasn't able to prevent being laid flat.  During the hospitalization, pt said he was treated for PNA with 2 abx (completed), and was found to be pos for rhinovirus infection.  Pt was discharged with 3L home O2 and pt chose to go home instead of SNF rehab.  Pt said after he got home, he started having more shortness of breath.  Caregiver increased his O2 to 6L, but O2 sats were still 71%, so pt presented to our ED.  ED Course: initial vitals: afebrile, pulse 88, BP 146/66, RR 20, sating 91% on 3L.  Labs notable for WBC 18.6, pro BNP 5760, trop 40's.  Pt received vanc, cefepime  and doxy in the ED prior to admission.   Assessment/Plan  # Acute on chronic hypoxemic respiratory failure --pt was discharged from [facility] with 3L O2 the day prior to admission.  Pt went into respiratory distress and desat on 3L shortly after arriving home.  After presentation, O2 sats in 90's on 3L O2, but likely has higher O2 requirement with exertion, which presented at home. --pt was just treated for aspiration PNA with abx at [facility] (various length of vanc, ceftriaxone , zosyn ), no fever, CXR showed similar multi-focal  PNA as from [facility], so pt unlikely to have a new PNA that needs further abx.   --pt may still be recovering from rhino-viral infection with cough and sputum production, and elevated BNP suggests possible fluid overload in the setting of mod sCHF.  Pt also suffered rib fractures with chest compressions during his recent cardiac arrest, so had difficulty taking deep breath.  No hx of COPD or asthma. Plan: --Continue supplemental O2 to keep sats >=90% --hold further abx --tx CHF with diuretics --Mucus clearance  --IS  # Acute sCHF exacerbation --Echo on 03/11/24 showed LVEF 30%.  pro BNP 5760. --IV lasix  40 x2 on day of admission --cont IV lasix  40 daily --cont home coreg  and aldactone   # Recent rhinoviral infection --still has coughing and sputum production --start mucomyst  neb BID with DuoNeb BID  # DM2 --A1c 7.0, well controlled --cont Lantus  35u daily --ACHS and SSI  # Back pain # Extensive spine hardware --Extensive prior spinal surgery history (Mendoza PSF C1-4, ASF C6-7, PSF T10-L5, cauda equina decompression, C6-T2 laminectomy 07/2023)  --pt originally presented to [facility] due to worsening back pain.  Pt only takes gabapentin  300 mg BID for back pain. --increase night-time gabapentin  to 600 mg.  # Right Femoral Vein Thrombus:  --Seen on US  at [facility]; patient on dabigatran  prior to [facility] admission with likely failure, transitioned to Eliquis .  --cont Eliquis   # Recent Rib Fractures (Left 4th and 5th) --Likely from  chest compressions during [facility] hospitalization.  --IS  # Paroxysmal Afib:  --cont Coreg  and Eliquis    # Depression --cont home Wellbutrin  and Lexapro   # Obesity, BMI 32  # CAD s/p CABG (2015)  --cont ASA and statin   DVT prophylaxis: On:Eliquis  Code Status: DNR  Family Communication: caregiver updated at bedside on admission  Disposition Plan: to be determined  Consults called: none Level of care: Med-Surg   Review of Systems: As per HPI otherwise complete  review of systems negative.   Past Medical History:  Diagnosis Date   Arthritis    Atrial fibrillation (HCC)    a.) CHA2DS2-VASc = 7 (age x 2, HTN, DVT x 2, prior MI, T2DM). b.) s/p TEE with cardioversion (200J x 2) 12/06/2013. c.) chronically anticoagulated using apixaban    BPH (benign prostatic hyperplasia)    CAD (coronary artery disease)    a.) MI 12/31/83 -> LHC: 95% pLAD-1, 100% pLAD-2, 50% mLAD. b.) LHC 05/08/88: 50 pRCA, 95% mLAD. c.) LHC 02/17/02: 25% RCA, 25% LM, 75% OM1; PCI -> 0.25 x 12mm Medtronic stent to OM1. d.) LHC 08/17/02: <25% OM1, 25% RI, 25% LAD. e.) LHC 09/02/03: 100% pLAD, 75% mLAD-1, 50% mLAD-2, <25% pRCA, <25% LM, 25% pLCx, <25% OM2, 25% RI. f.) LHC 11/23/13: 100% LAD; ref to CVTS. g.) MIDCAB (LIMA-LAD) 11/29/13   CKD (chronic kidney disease), stage IV (HCC)    Deep vein thrombosis (DVT) of right lower extremity (HCC) 1985   DISH (diffuse idiopathic skeletal hyperostosis)    Erectile dysfunction    Gait instability    GERD (gastroesophageal reflux disease)    History of 2019 novel coronavirus disease (COVID-19) 11/20/2018   HLD (hyperlipidemia)    Hypertension    IDA (iron deficiency anemia)    Long term current use of anticoagulant    a.) Apixaban    MI (myocardial infarction) (HCC) 12/31/1983   a.) treated in [place]. b.) LHC: 95% pLAD-1, 100% pLAD-2, 50% mLAD; no intervention.   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 b.) gun in the home   T2DM (type 2 diabetes mellitus) (HCC)     Past Surgical History:  Procedure Laterality Date   ANKLE SURGERY Right 1989   plate in ankle   BACK SURGERY     11x  Degernerative disk   CARDIAC CATHETERIZATION Left 08/17/2002   Procedure: CARDIAC CATHETERIZATION; Location: [facility]; Surgeon: Lamar Hasten, MD   CARDIAC CATHETERIZATION Left 11/23/2013   Procedure: CARDIAC CATHETERIZATION; Location: [facility]; Surgeon: Joshua, MD   CARDIAC CATHETERIZATION Left 12/31/1983   Procedure: CARDIAC CATHETERIZATION; Location: [facility]; Surgeon: Miquel Ellen, MD   CARDIAC CATHETERIZATION Left 05/08/1988   Procedure: CARDIAC CATHETERIZATION; Location: [facility]; Surgeon: Lucian Never, MD   CARDIAC CATHETERIZATION Left 09/02/2003   Procedure: CARDIAC CATHETERIZATION; Location: [facility]; Surgeon: Ozell Estelle, MD   CORONARY ANGIOPLASTY WITH STENT PLACEMENT Left 02/17/2002   Procedure: CORONARY ANGIOPLASTY WITH STENT PLACEMENT (0.25 x 12 mm Medtronic stent to OM1); Location: [facility]   CYSTOSCOPY W/ RETROGRADES  02/09/2021   Procedure: CYSTOSCOPY WITH RETROGRADE PYELOGRAM;  Surgeon: Francisca Redell BROCKS, MD;  Location: [facility];  Service: Urology;;   CYSTOSCOPY/URETEROSCOPY/HOLMIUM LASER/STENT PLACEMENT Left 02/09/2021   Procedure: CYSTOSCOPY/URETEROSCOPY/HOLMIUM LASER/STENT PLACEMENT;  Surgeon: Francisca Redell BROCKS, MD;  Location: [facility] ORS;  Service: Urology;  Laterality: Left;   MINIMALLY INVASIVE DIRECT CORONARY ARTERY BYPASS (MIDCAB; LIMA -LAD) Left 11/29/2013   Procedure: MINIMALLY INVASIVE DIRECT CORONARY ARTERY BYPASS (MIDCAB; LIMA -LAD); Location: [facility]     reports that he has never smoked. He has never used smokeless tobacco. He reports that he does not currently use alcohol. He reports that he does not currently use drugs.  Allergies[1]  History reviewed. No pertinent family history.   Prior to Admission medications  Medication Sig Start Date End Date Taking? Authorizing Provider  apixaban  (ELIQUIS ) 5 MG TABS tablet Take 5 mg by mouth 2 (two) times daily. 03/23/24  Yes [provider]  benazepril  (LOTENSIN ) 10 MG tablet Take 10 mg by mouth daily. 01/19/24  Yes [provider]  buPROPion  (WELLBUTRIN  XL) 300 MG 24 hr tablet Take 300 mg by mouth daily. 01/22/24  Yes [provider]  carvedilol  (COREG ) 3.125 MG tablet Take 3.125 mg by mouth 2 (two) times daily with a meal. 03/24/24  Yes [provider]  finasteride  (PROSCAR ) 5 MG tablet Take 5 mg by mouth daily. 01/22/24  Yes [provider]  furosemide  (LASIX ) 20 MG tablet Take 20 mg by mouth daily. 01/05/24  Yes [provider]  spironolactone  (ALDACTONE ) 25 MG tablet Take 25 mg by mouth daily. 04/03/23  Yes [provider]  tamsulosin  (FLOMAX ) 0.4 MG CAPS capsule Take 0.4 mg by mouth daily. 01/22/24  Yes [provider]  acetaminophen  (TYLENOL ) 325 MG tablet Take 650 mg by mouth every 6 (six) hours as needed for headache.    [provider]  amLODipine  (NORVASC ) 5 MG tablet Take 2 tablets (10 mg total) by mouth daily. 11/23/22   Dorinda Drue DASEN, MD  amoxicillin -clavulanate (AUGMENTIN ) 875-125 MG tablet Take 1 tablet by mouth 2 (two) times daily. 11/23/22   Dorinda Drue DASEN, MD  aspirin  81 MG EC tablet Take 81 mg by mouth daily.    [provider]  brimonidine  (ALPHAGAN ) 0.2 % ophthalmic solution Place 1 drop into both eyes 3 (three) times daily.    [provider]  calcium carbonate (TUMS EX) 750 MG chewable tablet Chew 750 mg by mouth in the morning and at bedtime.    [provider]  Cholecalciferol  25 MCG (1000 UT) capsule Take 1 tablet by mouth daily. 05/30/09   [provider]  cyanocobalamin (VITAMIN B12) 1000 MCG tablet Take 1,000 mcg by mouth daily.    [provider]  dabigatran  (PRADAXA ) 75 MG CAPS capsule Take 75 mg by mouth 2 (two) times daily.    [provider]  diclofenac Sodium (VOLTAREN) 1 % GEL Apply 2 g topically in the morning and at bedtime.    [provider]  Dulaglutide 4.5 MG/0.5ML SOPN Inject 4.5 mg into the skin every Monday.    [provider]   empagliflozin (JARDIANCE) 25 MG TABS tablet Take 25 mg by mouth daily.    [provider]  escitalopram  (LEXAPRO ) 10 MG tablet Take 15 mg by mouth daily.    [provider]  ferrous sulfate  325 (65 FE) MG tablet Take 325 mg by mouth daily.    [provider]  fluticasone  (FLONASE ) 50 MCG/ACT nasal spray Place 2 sprays into both nostrils daily.    [provider]  gabapentin  (NEURONTIN ) 300 MG capsule  Take 300 mg by mouth 2 (two) times daily.    [provider]  hydrALAZINE  (APRESOLINE ) 50 MG tablet Take 1 tablet (50 mg total) by mouth 3 (three) times daily. 11/23/22   Dorinda Drue DASEN, MD  hydrocortisone 2.5 % cream Apply 1 Application topically 2 (two) times daily as needed (facial rash).    [provider]  insulin  aspart (NOVOLOG ) 100 UNIT/ML injection Inject 0-50 Units into the skin 3 (three) times daily before meals.    [provider]  Insulin  Glargine (BASAGLAR  KWIKPEN) 100 UNIT/ML Inject 35 Units into the skin daily.    [provider]  ketoconazole (NIZORAL) 2 % shampoo Apply 1 Application topically 3 (three) times a week.    [provider]  magnesium oxide (MAG-OX) 400 MG tablet Take 400 mg by mouth daily.    [provider]  Multiple Vitamin (MULTIVITAMIN WITH MINERALS) TABS tablet Take 1 tablet by mouth daily.    [provider]  Omega-3 Fatty Acids (FISH OIL) 1200 MG CAPS Take 2,400 mg by mouth in the morning and at bedtime.    [provider]  pantoprazole  (PROTONIX ) 40 MG tablet Take 40 mg by mouth daily.    [provider]  pravastatin  (PRAVACHOL ) 40 MG tablet Take 40 mg by mouth daily.    [provider]    Physical Exam: Vitals:   03/24/24 2237 03/25/24 0049 03/25/24 0302 03/25/24 0658  BP: (!) 146/66 (!) 158/70  138/83  Pulse: 88 73  74  Resp: 20 19  18   Temp: 98.3 F (36.8 C)   98.3 F (36.8 C)  TempSrc: Oral   Oral  SpO2: 91% 95% 98% 98%   Weight: 90.7 kg     Height: 5' 6 (1.676 m)       Constitutional: NAD, AAOx3 HEENT: conjunctivae and lids normal, EOMI CV: No cyanosis.   RESP: normal respiratory effort, on 3L SKIN: warm, dry Neuro: II - XII grossly intact.   Psych: Normal mood and affect.  Appropriate judgement and reason   Labs on Admission: I have personally reviewed labs and imaging studies  Time spent: 80 minutes  Ellouise Haber MD [facility]  If 7PM-7AM, please contact night-coverage 03/25/2024, 8:35 AM       [1]  Allergies Allergen Reactions   Oxycodone      Patient states you don't know where you are at, and it screws you up. It also gives crazy dreams.   Latex    "

## 2024-03-25 NOTE — Sepsis Progress Note (Signed)
 Elink monitoring for the code sepsis protocol.

## 2024-03-25 NOTE — Plan of Care (Signed)

## 2024-03-25 NOTE — Progress Notes (Signed)
 CODE SEPSIS - PHARMACY COMMUNICATION  **Broad Spectrum Antibiotics should be administered within 1 hour of Sepsis diagnosis**  Time Code Sepsis Called/Page Received: 0345  Antibiotics Ordered: cefepime  and vancomycin   Time of 1st antibiotic administration: 0252  Additional action taken by pharmacy: None  If necessary, Name of Provider/Nurse Contacted: None    Damien Napoleon ,PharmD Clinical Pharmacist  03/25/2024  3:48 AM

## 2024-03-25 NOTE — ED Provider Notes (Signed)
 "  [facility] Provider Note    Event Date/Time   First MD Initiated Contact with Patient 03/25/24 0236     (approximate)   History   Shortness of Breath   HPI  Derek Andersen is a 84 y.o. male with history of atrial fibrillation, CAD, DVT on Eliquis , hypertension, hyperlipidemia, diabetes, chronic kidney disease who presents to the emergency department with increased shortness of breath, cough and congestion.  He was just recently discharged from [facility] several hours ago after he was admitted for UTI, sepsis, rhinovirus and had a cardiac arrest.  He was intubated and in the ICU for several days.  States he was only home about 3 hours when he started feeling short of breath and went into respiratory distress.  He was discharged on 3 L.  His family reports that they increased it to 6 L but his sats were still 71%.  He is now back on 3 L and doing better.  He is not having any chest pain.  No lower extremity swelling or discomfort.  No vomiting or diarrhea.  Was not discharged home on any antibiotics.   History provided by patient and family.    Past Medical History:  Diagnosis Date   Arthritis    Atrial fibrillation (HCC)    a.) CHA2DS2-VASc = 7 (age x 2, HTN, DVT x 2, prior MI, T2DM). b.) s/p TEE with cardioversion (200J x 2) 12/06/2013. c.) chronically anticoagulated using apixaban    BPH (benign prostatic hyperplasia)    CAD (coronary artery disease)    a.) MI 12/31/83 -> LHC: 95% pLAD-1, 100% pLAD-2, 50% mLAD. b.) LHC 05/08/88: 50 pRCA, 95% mLAD. c.) LHC 02/17/02: 25% RCA, 25% LM, 75% OM1; PCI -> 0.25 x 12mm Medtronic stent to OM1. d.) LHC 08/17/02: <25% OM1, 25% RI, 25% LAD. e.) LHC 09/02/03: 100% pLAD, 75% mLAD-1, 50% mLAD-2, <25% pRCA, <25% LM, 25% pLCx, <25% OM2, 25% RI. f.) LHC 11/23/13: 100% LAD; ref to CVTS. 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 b.) LHC: 95% pLAD-1, 100% pLAD-2, 50% mLAD; no intervention.   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 b.) gun in the home   T2DM (type 2 diabetes mellitus) (HCC)     Past Surgical History:  Procedure Laterality Date   ANKLE SURGERY Right 1989   plate in ankle   BACK SURGERY     11x Degernerative disk   CARDIAC CATHETERIZATION Left 08/17/2002   Procedure: CARDIAC CATHETERIZATION; Location: [facility]; Surgeon: Lamar  Zulema, MD   CARDIAC CATHETERIZATION Left 11/23/2013   Procedure: CARDIAC CATHETERIZATION; Location: [facility]; Surgeon: Joshua, MD   CARDIAC CATHETERIZATION Left 12/31/1983   Procedure: CARDIAC CATHETERIZATION; Location: [facility]; Surgeon: Miquel Ellen, MD   CARDIAC CATHETERIZATION Left 05/08/1988   Procedure: CARDIAC CATHETERIZATION; Location: [facility]; Surgeon: Lucian Never, MD   CARDIAC CATHETERIZATION Left 09/02/2003   Procedure: CARDIAC CATHETERIZATION; Location: [facility]; Surgeon: Ozell Estelle, MD   CORONARY ANGIOPLASTY WITH STENT PLACEMENT Left 02/17/2002   Procedure: CORONARY ANGIOPLASTY WITH STENT PLACEMENT (0.25 x 12 mm Medtronic stent to OM1); Location: [facility]   CYSTOSCOPY W/ RETROGRADES  02/09/2021   Procedure: CYSTOSCOPY  WITH RETROGRADE PYELOGRAM;  Surgeon: Francisca Redell BROCKS, MD;  Location: [facility];  Service: Urology;;   CYSTOSCOPY/URETEROSCOPY/HOLMIUM LASER/STENT PLACEMENT Left 02/09/2021   Procedure: CYSTOSCOPY/URETEROSCOPY/HOLMIUM LASER/STENT PLACEMENT;  Surgeon: Francisca Redell BROCKS, MD;  Location: [facility];  Service: Urology;  Laterality: Left;   MINIMALLY INVASIVE DIRECT CORONARY ARTERY BYPASS (MIDCAB; LIMA -LAD) Left 11/29/2013   Procedure: MINIMALLY INVASIVE DIRECT CORONARY ARTERY BYPASS (MIDCAB; LIMA -LAD); Location: [facility]    MEDICATIONS:  Prior to Admission medications  Medication Sig Start Date End Date Taking? Authorizing Provider  acetaminophen  (TYLENOL ) 325 MG tablet Take 650 mg by mouth every 6 (six) hours as needed for headache.    [provider]  amLODipine  (NORVASC ) 5 MG tablet Take 2 tablets (10 mg total) by mouth daily. 11/23/22   Dorinda Drue DASEN, MD  amoxicillin -clavulanate (AUGMENTIN ) 875-125 MG tablet Take 1 tablet by mouth 2 (two) times daily. 11/23/22   Dorinda Drue DASEN, MD  aspirin  81 MG EC tablet Take 81 mg by mouth daily.    [provider]  brimonidine  (ALPHAGAN ) 0.2 % ophthalmic solution Place 1 drop into both eyes 3 (three) times daily.    [provider]  buPROPion  (WELLBUTRIN  XL) 300 MG 24 hr tablet Take 300 mg by mouth daily.    [provider]  calcium carbonate (TUMS EX) 750 MG chewable tablet Chew 750 mg by mouth in the morning and at bedtime.    [provider]  Cholecalciferol  25 MCG (1000 UT) capsule Take 1 tablet by mouth daily. 05/30/09   [provider]  cyanocobalamin (VITAMIN B12) 1000 MCG tablet Take 1,000 mcg by mouth daily.    [provider]  dabigatran  (PRADAXA ) 75 MG CAPS capsule Take 75 mg by mouth 2 (two) times daily.    [provider]  diclofenac Sodium (VOLTAREN) 1 % GEL Apply 2 g topically in the morning and at bedtime.    [provider]  Dulaglutide 4.5 MG/0.5ML SOPN Inject 4.5 mg into  the skin every Monday.    [provider]  empagliflozin (JARDIANCE) 25 MG TABS tablet Take 25 mg by mouth daily.    [provider]  escitalopram  (LEXAPRO ) 10 MG tablet Take 15 mg by mouth daily.    [provider]  ferrous sulfate  325 (65 FE) MG tablet Take 325 mg by mouth daily.    [provider]  finasteride  (PROSCAR ) 5 MG tablet Take 5 mg by mouth daily.    [provider]  fluticasone  (FLONASE ) 50 MCG/ACT nasal spray Place 2 sprays into both nostrils daily.    [provider]  hydrALAZINE  (APRESOLINE ) 50 MG tablet Take 1 tablet (50 mg total) by mouth 3 (three) times daily. 11/23/22   Dorinda Drue DASEN, MD  hydrocortisone 2.5 % cream Apply 1 Application topically 2 (two) times daily as needed (facial rash).    [provider]  insulin  aspart (NOVOLOG ) 100 UNIT/ML injection Inject 0-50 Units into the skin 3 (three) times daily before meals.    [provider]  Insulin  Glargine (BASAGLAR  KWIKPEN) 100 UNIT/ML Inject 35 Units into the skin daily.    [provider]  ketoconazole (NIZORAL) 2 % shampoo Apply 1 Application topically 3 (three) times a week.    [provider]  magnesium oxide (MAG-OX) 400 MG tablet Take 400 mg by mouth daily.    [provider]  Multiple Vitamin (MULTIVITAMIN WITH MINERALS) TABS tablet Take 1 tablet by mouth daily.    [provider]  Omega-3 Fatty Acids (FISH OIL) 1200 MG CAPS Take 2,400 mg by mouth in the morning and at bedtime.    [provider]  pantoprazole  (PROTONIX ) 40 MG tablet Take 40 mg by mouth daily.    [provider]  pravastatin  (PRAVACHOL ) 40 MG tablet Take 40 mg by mouth daily.    [provider]  tamsulosin  (FLOMAX ) 0.4 MG CAPS capsule Take 0.4 mg by mouth at bedtime.    [provider]    Physical Exam   Triage Vital Signs: ED Triage Vitals [03/24/24 2237]  Encounter Vitals Group     BP (!) 146/66      Girls Systolic BP Percentile      Girls Diastolic BP Percentile      Boys Systolic BP Percentile      Boys Diastolic BP Percentile      Pulse Rate 88     Resp 20     Temp 98.3 F (36.8 C)     Temp Source Oral     SpO2 91 %     Weight 200 lb (90.7 kg)     Height 5' 6 (1.676 m)     Head Circumference      Peak Flow      Pain Score 0     Pain Loc      Pain Education      Exclude from Growth Chart     Most recent vital signs: Vitals:   03/25/24 0302 03/25/24 0658  BP:  138/83  Pulse:  74  Resp:  18  Temp:  98.3 F (36.8 C)  SpO2: 98% 98%    CONSTITUTIONAL: Alert, responds appropriately to questions.  Elderly HEAD: Normocephalic, atraumatic EYES: Conjunctivae clear, pupils appear equal, sclera nonicteric ENT: normal nose; moist mucous membranes NECK: Supple, normal ROM CARD: RRR; S1 and S2 appreciated RESP: Normal chest excursion without splinting or tachypnea; rhonchorous breath sounds diffusely, no wheezing, speaking full sentences, no hypoxia on 3 L, no respiratory distress ABD/GI: Non-distended; soft, non-tender, no rebound, no guarding, no peritoneal signs BACK: The back appears normal EXT: Normal ROM in all joints; no deformity noted, no edema, no calf tenderness or calf swelling SKIN: Normal color for age and race; warm; no rash on exposed skin NEURO: Moves all extremities equally, normal speech PSYCH: The patient's mood and manner are appropriate.   ED Results / Procedures / Treatments   LABS: (all labs ordered are listed, but only abnormal results are displayed) Labs Reviewed  BASIC METABOLIC PANEL WITH GFR - Abnormal; Notable for the following components:      Result Value   Glucose, Bld 230 (*)    All other components within normal limits  CBC - Abnormal; Notable for the following components:   WBC 18.6 (*)    RBC 3.34 (*)    Hemoglobin 9.7 (*)    HCT  31.3 (*)    Platelets 479 (*)    All other components within normal limits  CBG MONITORING, ED -  Abnormal; Notable for the following components:   Glucose-Capillary 143 (*)    All other components within normal limits  TROPONIN T, HIGH SENSITIVITY - Abnormal; Notable for the following components:   Troponin T High Sensitivity 43 (*)    All other components within normal limits  TROPONIN T, HIGH SENSITIVITY - Abnormal; Notable for the following components:   Troponin T High Sensitivity 48 (*)    All other components within normal limits  RESP PANEL BY RT-PCR (RSV, FLU A&B, COVID)  RVPGX2  CULTURE, BLOOD (ROUTINE X 2)  CULTURE, BLOOD (ROUTINE X 2)  MRSA NEXT GEN BY PCR, NASAL  PROTIME-INR  PROCALCITONIN  LACTIC ACID, PLASMA  HEMOGLOBIN A1C     EKG:  EKG Interpretation Date/Time:  Wednesday March 24 2024 22:35:16 EST Ventricular Rate:  83 PR Interval:  222 QRS Duration:  108 QT Interval:  384 QTC Calculation: 451 R Axis:   -40  Text Interpretation: Sinus rhythm with 1st degree A-V block Left axis deviation Pulmonary disease pattern Left ventricular hypertrophy with repolarization abnormality ( R in aVL , Cornell product , Romhilt-Estes ) Cannot rule out Septal infarct , age undetermined Abnormal ECG When compared with ECG of 20-Dec-2023 11:37, PREVIOUS ECG IS PRESENT Confirmed by Neomi Neptune [phone]) on 03/25/2024 3:31:53 AM         RADIOLOGY: My personal review and interpretation of imaging: Chest x-ray shows multifocal pneumonia.  I have personally reviewed all radiology reports.   DG Chest 1 View Result Date: 03/24/2024 EXAM: 1 VIEW(S) XRAY OF THE CHEST 03/24/2024 11:11:00 PM COMPARISON: 06/26/2023 CLINICAL HISTORY: SOB (shortness of breath) FINDINGS: LUNGS AND PLEURA: Low lung volumes. Left basilar airspace opacity. Right upper lobe airspace opacity. Small left pleural effusion. No pneumothorax. HEART AND MEDIASTINUM: No acute abnormality of the cardiac and mediastinal silhouettes. BONES AND SOFT TISSUES: Thoracic surgical hardware noted. No acute osseous  abnormality. IMPRESSION: 1. Left basilar and right upper lobe airspace opacities. 2. Small left pleural effusion. 3. Low lung volumes. Electronically signed by: Morgane Naveau MD 03/24/2024 11:15 PM EST RP Workstation: [id number]     PROCEDURES:  Critical Care performed: Yes, see critical care procedure note(s)   CRITICAL CARE Performed by: Neptune Neomi   Total critical care time: 30 minutes  Critical care time was exclusive of separately billable procedures and treating other patients.  Critical care was necessary to treat or prevent imminent or life-threatening deterioration.  Critical care was time spent personally by me on the following activities: development of treatment plan with patient and/or surrogate as well as nursing, discussions with consultants, evaluation of patient's response to treatment, examination of patient, obtaining history from patient or surrogate, ordering and performing treatments and interventions, ordering and review of laboratory studies, ordering and review of radiographic studies, pulse oximetry and re-evaluation of patient's condition.   SABRA1-3 Lead EKG Interpretation  Performed by: Norely Schlick, Neptune SAILOR, DO Authorized by: Willadean Guyton, Neptune SAILOR, DO     Interpretation: normal     ECG rate:  74   ECG rate assessment: normal     Rhythm: sinus rhythm     Ectopy: none     Conduction: normal       IMPRESSION / MDM / ASSESSMENT AND PLAN / ED COURSE  I reviewed the triage vital signs and the nursing notes.    Patient here with cough, congestion, shortness of  breath.  Recently discharged from [facility] after UTI, pneumonia, rhinovirus, sepsis and subsequent cardiac arrest.  The patient is on the cardiac monitor to evaluate for evidence of arrhythmia and/or significant heart rate changes.   DIFFERENTIAL DIAGNOSIS (includes but not limited to):   Pneumonia, viral illness, PE, pneumothorax, CHF, ACS, sepsis   Patient's presentation is most consistent with  acute presentation with potential threat to life or bodily function.   PLAN: Workup initiated from triage.  Patient has a leukocytosis of 18,000.  Rhonchorous breath sounds here.  Will give breathing treatment for symptomatic relief.  Troponins minimally elevated but flat.  He denies have any chest pain.  Will add on lactic acid, procalcitonin, cultures.  Will obtain COVID and flu swab.  Chest x-ray reviewed and interpreted by myself and radiologist and shows multifocal pneumonia.  Will give antibiotics for HCAP.  Will reach out to [facility] given he was just discharged from their facility.   MEDICATIONS GIVEN IN ED: Medications  aspirin  EC tablet 81 mg (has no administration in time range)  amLODipine  (NORVASC ) tablet 10 mg (has no administration in time range)  pravastatin  (PRAVACHOL ) tablet 40 mg (has no administration in time range)  Basaglar  KwikPen KwikPen 35 Units (has no administration in time range)  carvedilol  (COREG ) tablet 3.125 mg (has no administration in time range)  benazepril  (LOTENSIN ) tablet 10 mg (has no administration in time range)  furosemide  (LASIX ) tablet 20 mg (has no administration in time range)  spironolactone  (ALDACTONE ) tablet 25 mg (has no administration in time range)  buPROPion  (WELLBUTRIN  XL) 24 hr tablet 300 mg (has no administration in time range)  pantoprazole  (PROTONIX ) EC tablet 40 mg (has no administration in time range)  finasteride  (PROSCAR ) tablet 5 mg (has no administration in time range)  tamsulosin  (FLOMAX ) capsule 0.4 mg (has no administration in time range)  apixaban  (ELIQUIS ) tablet 5 mg (has no administration in time range)  acetaminophen  (TYLENOL ) tablet 650 mg (has no administration in time range)    Or  acetaminophen  (TYLENOL ) suppository 650 mg (has no administration in time range)  ondansetron  (ZOFRAN ) tablet 4 mg (has no administration in time range)    Or  ondansetron  (ZOFRAN ) injection 4 mg (has no administration in time range)   insulin  aspart (novoLOG ) injection 0-15 Units (2 Units Subcutaneous Given 03/25/24 0755)  insulin  aspart (novoLOG ) injection 0-5 Units (has no administration in time range)  HYDROcodone -acetaminophen  (NORCO/VICODIN) 5-325 MG per tablet 1-2 tablet (has no administration in time range)  guaiFENesin -dextromethorphan (ROBITUSSIN DM) 100-10 MG/5ML syrup 10 mL (has no administration in time range)  cefTRIAXone  (ROCEPHIN ) 2 g in sodium chloride  0.9 % 100 mL IVPB (has no administration in time range)  doxycycline  (VIBRAMYCIN ) 100 mg in sodium chloride  0.9 % 250 mL IVPB (0 mg Intravenous Stopped 03/25/24 0738)  ceFEPIme  (MAXIPIME ) 2 g in sodium chloride  0.9 % 100 mL IVPB (0 g Intravenous Stopped 03/25/24 0322)  vancomycin  (VANCOREADY) IVPB 2000 mg/400 mL (0 mg Intravenous Stopped 03/25/24 0532)  ipratropium-albuterol  (DUONEB) 0.5-2.5 (3) MG/3ML nebulizer solution 3 mL (3 mLs Nebulization Given 03/25/24 0331)     ED COURSE: [facility] declined patient due to capacity.  Lactic acid level here is normal.  Procalcitonin minimally elevated.  COVID, flu and RSV negative.  Will discuss with our hospitalist for admission.   CONSULTS:  Consulted and discussed patient's case with hospitalist, Dr. Cleatus.  I have recommended admission and consulting physician agrees and will place admission orders.  Patient (and family if present)  agree with this plan.   I reviewed all nursing notes, vitals, pertinent previous records.  All labs, EKGs, imaging ordered have been independently reviewed and interpreted by myself.    OUTSIDE RECORDS REVIEWED: Reviewed recent admission at [facility].       FINAL CLINICAL IMPRESSION(S) / ED DIAGNOSES   Final diagnoses:  Multifocal pneumonia     Rx / DC Orders   ED Discharge Orders     None        Note:  This document was prepared using Dragon voice recognition software and may include unintentional dictation errors.   Oretta Berkland, Josette SAILOR, DO 03/25/24 [phone]  "

## 2024-03-25 NOTE — ED Notes (Signed)
Blood cultures obtained prior to starting antibiotics

## 2024-03-25 NOTE — Progress Notes (Signed)
 ED Pharmacy Antibiotic Sign Off An antibiotic consult was received from an ED provider for vancomycin  per pharmacy dosing for pneumonia. A chart review was completed to assess appropriateness.   The following one time order(s) were placed:  Vancomycin  2000 mg IV x1  Further antibiotic and/or antibiotic pharmacy consults should be ordered by the admitting provider if indicated.   Thank you for allowing pharmacy to be a part of this patient's care.   Damien Napoleon, [facility]  Clinical Pharmacist 03/25/24 3:05 AM

## 2024-03-26 DIAGNOSIS — I2510 Atherosclerotic heart disease of native coronary artery without angina pectoris: Secondary | ICD-10-CM | POA: Diagnosis present

## 2024-03-26 DIAGNOSIS — S2249XA Multiple fractures of ribs, unspecified side, initial encounter for closed fracture: Secondary | ICD-10-CM

## 2024-03-26 DIAGNOSIS — Z8616 Personal history of COVID-19: Secondary | ICD-10-CM | POA: Diagnosis not present

## 2024-03-26 DIAGNOSIS — S2242XS Multiple fractures of ribs, left side, sequela: Secondary | ICD-10-CM | POA: Diagnosis not present

## 2024-03-26 DIAGNOSIS — J188 Other pneumonia, unspecified organism: Secondary | ICD-10-CM | POA: Diagnosis present

## 2024-03-26 DIAGNOSIS — F419 Anxiety disorder, unspecified: Secondary | ICD-10-CM | POA: Diagnosis present

## 2024-03-26 DIAGNOSIS — E669 Obesity, unspecified: Secondary | ICD-10-CM

## 2024-03-26 DIAGNOSIS — J9621 Acute and chronic respiratory failure with hypoxia: Secondary | ICD-10-CM | POA: Diagnosis present

## 2024-03-26 DIAGNOSIS — L89152 Pressure ulcer of sacral region, stage 2: Secondary | ICD-10-CM | POA: Diagnosis present

## 2024-03-26 DIAGNOSIS — E1122 Type 2 diabetes mellitus with diabetic chronic kidney disease: Secondary | ICD-10-CM | POA: Diagnosis present

## 2024-03-26 DIAGNOSIS — N179 Acute kidney failure, unspecified: Secondary | ICD-10-CM | POA: Diagnosis present

## 2024-03-26 DIAGNOSIS — F418 Other specified anxiety disorders: Secondary | ICD-10-CM

## 2024-03-26 DIAGNOSIS — E1165 Type 2 diabetes mellitus with hyperglycemia: Secondary | ICD-10-CM

## 2024-03-26 DIAGNOSIS — Z7901 Long term (current) use of anticoagulants: Secondary | ICD-10-CM | POA: Diagnosis not present

## 2024-03-26 DIAGNOSIS — I480 Paroxysmal atrial fibrillation: Secondary | ICD-10-CM

## 2024-03-26 DIAGNOSIS — I82411 Acute embolism and thrombosis of right femoral vein: Secondary | ICD-10-CM | POA: Diagnosis present

## 2024-03-26 DIAGNOSIS — Z794 Long term (current) use of insulin: Secondary | ICD-10-CM | POA: Diagnosis not present

## 2024-03-26 DIAGNOSIS — N400 Enlarged prostate without lower urinary tract symptoms: Secondary | ICD-10-CM | POA: Diagnosis present

## 2024-03-26 DIAGNOSIS — F32A Depression, unspecified: Secondary | ICD-10-CM | POA: Diagnosis present

## 2024-03-26 DIAGNOSIS — L89311 Pressure ulcer of right buttock, stage 1: Secondary | ICD-10-CM | POA: Diagnosis present

## 2024-03-26 DIAGNOSIS — Z6832 Body mass index (BMI) 32.0-32.9, adult: Secondary | ICD-10-CM | POA: Diagnosis not present

## 2024-03-26 DIAGNOSIS — G8929 Other chronic pain: Secondary | ICD-10-CM | POA: Diagnosis present

## 2024-03-26 DIAGNOSIS — I5023 Acute on chronic systolic (congestive) heart failure: Secondary | ICD-10-CM | POA: Diagnosis present

## 2024-03-26 DIAGNOSIS — L8990 Pressure ulcer of unspecified site, unspecified stage: Secondary | ICD-10-CM | POA: Insufficient documentation

## 2024-03-26 DIAGNOSIS — J189 Pneumonia, unspecified organism: Secondary | ICD-10-CM | POA: Diagnosis present

## 2024-03-26 DIAGNOSIS — I130 Hypertensive heart and chronic kidney disease with heart failure and stage 1 through stage 4 chronic kidney disease, or unspecified chronic kidney disease: Secondary | ICD-10-CM | POA: Diagnosis present

## 2024-03-26 DIAGNOSIS — E785 Hyperlipidemia, unspecified: Secondary | ICD-10-CM | POA: Diagnosis present

## 2024-03-26 DIAGNOSIS — M5489 Other dorsalgia: Secondary | ICD-10-CM | POA: Diagnosis not present

## 2024-03-26 DIAGNOSIS — Z66 Do not resuscitate: Secondary | ICD-10-CM | POA: Diagnosis present

## 2024-03-26 DIAGNOSIS — N184 Chronic kidney disease, stage 4 (severe): Secondary | ICD-10-CM | POA: Diagnosis present

## 2024-03-26 DIAGNOSIS — X58XXXD Exposure to other specified factors, subsequent encounter: Secondary | ICD-10-CM | POA: Diagnosis present

## 2024-03-26 DIAGNOSIS — I82401 Acute embolism and thrombosis of unspecified deep veins of right lower extremity: Secondary | ICD-10-CM | POA: Diagnosis not present

## 2024-03-26 DIAGNOSIS — Z7982 Long term (current) use of aspirin: Secondary | ICD-10-CM | POA: Diagnosis not present

## 2024-03-26 DIAGNOSIS — I82409 Acute embolism and thrombosis of unspecified deep veins of unspecified lower extremity: Secondary | ICD-10-CM

## 2024-03-26 LAB — GLUCOSE, CAPILLARY
Glucose-Capillary: 150 mg/dL — ABNORMAL HIGH (ref 70–99)
Glucose-Capillary: 235 mg/dL — ABNORMAL HIGH (ref 70–99)
Glucose-Capillary: 240 mg/dL — ABNORMAL HIGH (ref 70–99)
Glucose-Capillary: 214 mg/dL — ABNORMAL HIGH (ref 70–99)

## 2024-03-26 LAB — CBC
HCT: 30.7 % — ABNORMAL LOW (ref 39.0–52.0)
Hemoglobin: 9.7 g/dL — ABNORMAL LOW (ref 13.0–17.0)
MCH: 29.1 pg (ref 26.0–34.0)
MCHC: 31.6 g/dL (ref 30.0–36.0)
MCV: 92.2 fL (ref 80.0–100.0)
Platelets: 437 K/uL — ABNORMAL HIGH (ref 150–400)
RBC: 3.33 MIL/uL — ABNORMAL LOW (ref 4.22–5.81)
RDW: 15.4 % (ref 11.5–15.5)
WBC: 10.1 K/uL (ref 4.0–10.5)
nRBC: 0 % (ref 0.0–0.2)

## 2024-03-26 LAB — BASIC METABOLIC PANEL WITH GFR
Anion gap: 9 (ref 5–15)
BUN: 25 mg/dL — ABNORMAL HIGH (ref 8–23)
CO2: 31 mmol/L (ref 22–32)
Calcium: 9.6 mg/dL (ref 8.9–10.3)
Chloride: 96 mmol/L — ABNORMAL LOW (ref 98–111)
Creatinine, Ser: 1.32 mg/dL — ABNORMAL HIGH (ref 0.61–1.24)
GFR, Estimated: 54 mL/min — ABNORMAL LOW
Glucose, Bld: 154 mg/dL — ABNORMAL HIGH (ref 70–99)
Potassium: 4.3 mmol/L (ref 3.5–5.1)
Sodium: 136 mmol/L (ref 135–145)

## 2024-03-26 LAB — MAGNESIUM: Magnesium: 1.8 mg/dL (ref 1.7–2.4)

## 2024-03-26 MED ORDER — ALPRAZOLAM 0.25 MG PO TABS: 0.2500 mg | ORAL_TABLET | Freq: Three times a day (TID) | ORAL | Status: DC | PRN

## 2024-03-26 MED ORDER — SPIRONOLACTONE 12.5 MG HALF TABLET
12.5000 mg | ORAL_TABLET | Freq: Every day | ORAL | Status: DC
  Administered 2024-03-26 – 2024-03-28 (×3): 12.5 mg via ORAL
  Filled 2024-03-26 (×3): qty 1

## 2024-03-26 MED ORDER — ESCITALOPRAM OXALATE 10 MG PO TABS
20.0000 mg | ORAL_TABLET | Freq: Every day | ORAL | Status: DC
  Administered 2024-03-27 – 2024-03-28 (×2): 20 mg via ORAL
  Filled 2024-03-26 (×2): qty 2

## 2024-03-26 NOTE — Assessment & Plan Note (Addendum)
 Recently discharged from [facility] on 3 L of oxygen.  Prior to that had not been on oxygen.  Family had to increase up to 5 L of oxygen at home secondary to hypoxia with pulse ox in the 80s.  Will try to see if we can get him off the oxygen completely.

## 2024-03-26 NOTE — Assessment & Plan Note (Signed)
On Coreg and Eliquis 

## 2024-03-26 NOTE — Assessment & Plan Note (Signed)
 On gabapentin 

## 2024-03-26 NOTE — Assessment & Plan Note (Signed)
 Right femoral vein on Eliquis 

## 2024-03-26 NOTE — Assessment & Plan Note (Signed)
 Wound 03/25/24 1400 Pressure Injury Sacrum Mid Stage 2 -  Partial thickness loss of dermis presenting as a shallow open injury with a red, pink wound bed without slough. (Active)     Wound 03/25/24 1400 Pressure Injury Buttocks Right Stage 1 -  Intact skin with non-blanchable redness of a localized area usually over a bony prominence. (Active)   Present on admission

## 2024-03-26 NOTE — TOC Initial Note (Signed)
 Transition of Care [facility]) - Initial/Assessment Note    Patient Details  Name: Derek Andersen MRN: [id number] Date of Birth: 1941-01-04  [facility] CM/SW Contact:    Nathanael CHRISTELLA Ring, RN Phone Number: 03/26/2024, 4:54 PM  Clinical Narrative:                 CM met with patient at the bedside, his in home caregiver, Andrea, is at he bedside helping him brush his teeth.  He has another caregiver, Natalie, between [place] and Natalie patient has 12 hours of care per day starting at 7 am until around noon and then from 6 to 10.  He has all needed DME at home.  They do believe that he could benefit from an electric wheelchair, will reach out to [employer] to see if it can be ordered but I believe that they will need to go through his PCP.  He is current with his PCP.   He just discharged from [facility] and then 3 hours later went into distress and needed to be brought back to the ED.  [facility] did set him up with Oxygen and home health.  Home Health is with [facility].  Will reach out [facility].  Patient will need EMS transport home.  He does have a Stage 2 wound to his bottom.    Expected Discharge Plan: Home w Home Health Services Barriers to Discharge: Continued Medical Work up   Patient Goals and CMS Choice Patient states their goals for this hospitalization and ongoing recovery are:: Wants to go home- has caregivers at home CMS Medicare.gov Compare Post Acute Care list provided to:: Patient Choice offered to / list presented to : Patient Talent ownership interest in [facility].provided to:: Patient    Expected Discharge Plan and Services   Discharge Planning Services: CM Consult Post Acute Care Choice: Home Health Living arrangements for the past 2 months: Single Family Home                 DME Arranged: N/A         HH Arranged: RN, PT, OT HH Agency: [facility] Date HH Agency Contacted: 03/26/24 Time HH Agency Contacted: [phone]    Prior Living  Arrangements/Services Living arrangements for the past 2 months: Single Family Home Lives with:: Self Patient language and need for interpreter reviewed:: Yes Do you feel safe going back to the place where you live?: Yes      Need for Family Participation in Patient Care: Yes (Comment) Care giver support system in place?: Yes (comment) Current home services: DME, Homehealth aide Criminal Activity/Legal Involvement Pertinent to Current Situation/Hospitalization: No - Comment as needed  Activities of Daily Living   ADL Screening (condition at time of admission) Independently performs ADLs?: No Does the patient have a NEW difficulty with bathing/dressing/toileting/self-feeding that is expected to last >3 days?: Yes (Initiates electronic notice to provider for possible OT consult) Does the patient have a NEW difficulty with getting in/out of bed, walking, or climbing stairs that is expected to last >3 days?: Yes (Initiates electronic notice to provider for possible PT consult) Does the patient have a NEW difficulty with communication that is expected to last >3 days?: Yes (Initiates electronic notice to provider for possible SLP consult) Is the patient deaf or have difficulty hearing?: No Does the patient have difficulty seeing, even when wearing glasses/contacts?: No Does the patient have difficulty concentrating, remembering, or making decisions?: No  Permission Sought/Granted  Permission granted to share information with : Yes, Verbal Permission Granted     Permission granted to share info w AGENCY: Home health agency        Emotional Assessment Appearance:: Appears stated age Attitude/Demeanor/Rapport: Engaged Affect (typically observed): Accepting Orientation: : Oriented to Self, Oriented to Place, Oriented to  Time, Oriented to Situation Alcohol / Substance Use: Not Applicable Psych Involvement: No (comment)  Admission diagnosis:  Multifocal pneumonia [J18.8] Patient Active  Problem List   Diagnosis Date Noted   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 [place] [address] Phone: [phone] Fax: [phone]     Social Drivers of Health (SDOH) Social History: SDOH Screenings   Food Insecurity: No Food Insecurity (03/25/2024)  Housing: Low Risk (03/25/2024)  Transportation Needs: No Transportation Needs (03/25/2024)  Utilities: Not At Risk (03/25/2024)  Financial Resource Strain: Low Risk  (07/14/2023)   Received from [facility]  Social Connections: Socially Isolated (03/25/2024)  Stress: Stress Concern Present (08/13/2023)   Received from [facility]  Tobacco Use: Low Risk  (03/25/2024)   SDOH Interventions:     Readmission Risk Interventions     No data to display

## 2024-03-26 NOTE — Assessment & Plan Note (Signed)
 Continue Lantus  insulin  and sliding scale.

## 2024-03-26 NOTE — Progress Notes (Signed)
 " Progress Note   Patient: Derek Andersen FMW:[id number] DOB: 08-16-1940 DOA: 03/25/2024     0 DOS: the patient was seen and examined on 03/26/2024   Brief hospital course: 84 y.o. male with medical history significant of extensive spine hardware, CAD, sCHF, DM2, Afib, recent cardiac arrest, recently started on 3L O2, who presented with shortness of breath and O2 desat.     Pt reported he was just discharged from [facility] yesterday (03/24/24).  He presented to [facility] on 03/10/24 initially for back pain, and suffered a cardiac arrest when he was laid flat for MRI scan.  Pt said due to hardware in his neck, lying flat causes him to stop breathing, and due to having received sedation for MRI, he wasn't able to prevent being laid flat.  During the hospitalization, pt said he was treated for PNA with 2 abx (completed), and was found to be pos for rhinovirus infection.  Pt was discharged with 3L home O2 and pt chose to go home instead of SNF rehab.   Pt said after he got home, he started having more shortness of breath.  Caregiver increased his O2 to 6L, but O2 sats were still 71%, so pt presented to our ED.   ED Course: initial vitals: afebrile, pulse 88, BP 146/66, RR 20, sating 91% on 3L.  Labs notable for WBC 18.6, pro BNP 5760, trop 40's.  Pt received vanc, cefepime  and doxy in the ED prior to admission.  1/16.  White blood cell count down to 10.1, creatinine 1.32.  Continue diuresis today and reassess kidney function tomorrow.  PT and OT evaluations.  Chest PT    Assessment and Plan: * Acute on chronic hypoxic respiratory failure (HCC) Recently discharged from [facility] on 3 L of oxygen.  Prior to that had not been on oxygen.  Family had to increase up to 5 L of oxygen at home secondary to hypoxia with pulse ox in the 80s.  Now down to 2 L with good saturations.  Acute on chronic systolic CHF (congestive heart failure) (HCC) Continue Lasix  40 mg IV daily.  Patient on low-dose Coreg  and spironolactone .   Blood pressure on the lower side and may not tolerate more medication.  Multifocal pneumonia Hold off on further antibiotics since received antibiotics while at [facility].  Recent rhinovirus infection  Rib fractures Recent left 4th and 5th rib fracture  DVT (deep venous thrombosis) (HCC) Right femoral artery on Eliquis   Paroxysmal atrial fibrillation (HCC) On Coreg  and Eliquis   Uncontrolled type 2 diabetes mellitus with hyperglycemia, with long-term current use of insulin  (HCC) Continue Lantus  insulin  and sliding scale.  Chronic back pain On gabapentin   Depression with anxiety Increase Lexapro  dose to 20 mg daily continue Wellbutrin .  As needed Xanax   Obesity (BMI 30-39.9) Class I with a BMI of 32.28  Pressure injury of skin Wound 03/25/24 1400 Pressure Injury Sacrum Mid Stage 2 -  Partial thickness loss of dermis presenting as a shallow open injury with a red, pink wound bed without slough. (Active)     Wound 03/25/24 1400 Pressure Injury Buttocks Right Stage 1 -  Intact skin with non-blanchable redness of a localized area usually over a bony prominence. (Active)   Present on admission        Subjective: Patient feeling a little bit better.  Had blood pulse ox and had to go up to 5 L of oxygen at home.  Currently down to 2 L.  Felt short of breath.  Given IV  Lasix  yesterday and today  Physical Exam: Vitals:   03/26/24 0003 03/26/24 0311 03/26/24 0740 03/26/24 1128  BP: (!) 109/58 (!) 96/51 (!) 104/54 97/62  Pulse: 71 73 75 89  Resp: 16 16 (!) 21 19  Temp:  98.6 F (37 C) 98.7 F (37.1 C) 98.7 F (37.1 C)  TempSrc:   Oral Oral  SpO2: 99% 99% 97% 97%  Weight:      Height:       Physical Exam HENT:     Head: Normocephalic.     Mouth/Throat:     Pharynx: No oropharyngeal exudate.  Eyes:     General: Lids are normal.     Conjunctiva/sclera: Conjunctivae normal.  Cardiovascular:     Rate and Rhythm: Normal rate and regular rhythm.     Heart sounds: Normal heart  sounds, S1 normal and S2 normal.  Pulmonary:     Breath sounds: Examination of the right-lower field reveals decreased breath sounds and rhonchi. Examination of the left-lower field reveals decreased breath sounds and rhonchi. Decreased breath sounds and rhonchi present. No wheezing or rales.  Abdominal:     Palpations: Abdomen is soft.     Tenderness: There is no abdominal tenderness.  Musculoskeletal:     Right lower leg: No swelling.     Left lower leg: No swelling.  Skin:    General: Skin is warm.  Neurological:     Mental Status: He is alert and oriented to person, place, and time.     Data Reviewed: Creatinine 1.32, potassium 4.3, GFR 54, white blood cell count 10.1, hemoglobin 5.7, platelet count 437, proBNP 5760, lactic acid 0.9, procalcitonin 0.53  Family Communication: Caregiver at bedside.  Spoke with daughter on the phone.  Disposition: Status is: Inpatient Remains inpatient appropriate because: Continue diuresis today.  See if we can get off oxygen.  Planned Discharge Destination: Home with Home Health    Time spent: 29 minutes  Author: Charlie Patterson, MD 03/26/2024 12:52 PM  For on call review [email or web address].  "

## 2024-03-26 NOTE — Assessment & Plan Note (Signed)
 Class I with a BMI of 32.28

## 2024-03-26 NOTE — Assessment & Plan Note (Addendum)
 Hold off on further antibiotics since received antibiotics while at [facility].  Recent rhinovirus infection.

## 2024-03-26 NOTE — Plan of Care (Signed)
  Problem: Fluid Volume: Goal: Ability to maintain a balanced intake and output will improve Outcome: Progressing   Problem: Nutritional: Goal: Maintenance of adequate nutrition will improve Outcome: Progressing   Problem: Clinical Measurements: Goal: Respiratory complications will improve Outcome: Progressing

## 2024-03-26 NOTE — Evaluation (Signed)
 Occupational Therapy Evaluation Patient Details Name: Derek Andersen MRN: [id number] DOB: 1940/10/27 Today's Date: 03/26/2024   History of Present Illness   Derek Andersen is a 84 y.o. male with medical history significant of extensive spine hardware, CAD, sCHF, DM2, Afib, recent cardiac arrest, recently started on 3L O2, who presented with shortness of breath and O2 desat. Patient discharged from [facility] 1 day prior to readmission here.     Clinical Impressions Patient was seen for OT/PT evaluation this date. Prior to hospital admission, patient was receiving caregiver services 12 hours/day, required A for all ADLs, primarily used w/c and would SPT from w/c to toilet/bed/lift chair.  Patient required mod A for bed mobility, limited by weakness and pain. Sit<>Stand x 2 trials with max A a x2, unable to come fully upright. Max A to scoot EOB. Patient transitioned back to supine with min A. Patient presents with deficits in overall activity tolerance, standing tolerance and gross strength, affecting safe and optimal ADL completion. Patient is currently requiring max A for ADLs.  Paient would benefit from skilled OT services to address noted impairments and functional limitations (see below for any additional details) in order to maximize safety and independence while minimizing future risk of falls, injury, and readmission. Patients caregiver present, states she and other caregiver would be comfortable with patient discharging home as they can provide assist and work with [facility] therapies. Anticipate the need for follow up OT services upon acute hospital DC.      If plan is discharge home, recommend the following:   Two people to help with walking and/or transfers;A lot of help with bathing/dressing/bathroom;Assistance with cooking/housework;Direct supervision/assist for medications management;Direct supervision/assist for financial management;Help with stairs or ramp for entrance     Functional  Status Assessment   Patient has had a recent decline in their functional status and demonstrates the ability to make significant improvements in function in a reasonable and predictable amount of time.     Equipment Recommendations   None recommended by OT     Recommendations for Other Services         Precautions/Restrictions   Precautions Precautions: Fall Recall of Precautions/Restrictions: Intact Precaution/Restrictions Comments: HOB above 30 degrees Restrictions Weight Bearing Restrictions Per Provider Order: No     Mobility Bed Mobility Overal bed mobility: Needs Assistance Bed Mobility: Supine to Sit, Sit to Supine     Supine to sit: Mod assist, +2 for physical assistance, HOB elevated Sit to supine: Mod assist, +2 for physical assistance   General bed mobility comments: patient very stiff, requires assistance to get seated edge of bed    Transfers Overall transfer level: Needs assistance Equipment used: Rolling walker (2 wheels) Transfers: Sit to/from Stand Sit to Stand: Max assist, +2 physical assistance, From elevated surface           General transfer comment: patient is able to attempt standing 1x and is unable to get fully upright. Requires max cues and assist to perform.      Balance                                           ADL either performed or assessed with clinical judgement   ADL Overall ADL's : Needs assistance/impaired  General ADL Comments: anticipate that patient requires max A for ADLs due to significant deconditioning/weakness     Vision         Perception         Praxis         Pertinent Vitals/Pain Pain Assessment Pain Assessment: Faces Faces Pain Scale: Hurts a little bit Pain Location: back Pain Descriptors / Indicators: Discomfort Pain Intervention(s): Monitored during session     Extremity/Trunk Assessment Upper Extremity  Assessment Upper Extremity Assessment: Generalized weakness   Lower Extremity Assessment Lower Extremity Assessment: Generalized weakness   Cervical / Trunk Assessment Cervical / Trunk Assessment: Neck Surgery;Back Surgery   Communication Communication Communication: No apparent difficulties   Cognition Arousal: Alert Behavior During Therapy: WFL for tasks assessed/performed Cognition: No apparent impairments                               Following commands: Intact       Cueing  General Comments   Cueing Techniques: Verbal cues      Exercises     Shoulder Instructions      Home Living Family/patient expects to be discharged to:: Private residence Living Arrangements: Alone Available Help at Discharge: Personal care attendant;Available PRN/intermittently Type of Home: House             Bathroom Shower/Tub: Walk-in shower   Bathroom Toilet: Handicapped height Bathroom Accessibility: Yes How Accessible: Accessible via walker Home Equipment: Agricultural Consultant (2 wheels);Lift chair   Additional Comments: caregivers 12 hours a day to assist with all tasks      Prior Functioning/Environment Prior Level of Function : Needs assist       Physical Assist : Mobility (physical);ADLs (physical) Mobility (physical): Bed mobility;Transfers ADLs (physical): Bathing;Dressing;Toileting Mobility Comments: Patient left [facility] able to stand and transfer with assistance ADLs Comments: Caregiver assistance    OT Problem List: Decreased strength;Decreased activity tolerance;Impaired balance (sitting and/or standing)   OT Treatment/Interventions: Self-care/ADL training;Therapeutic exercise;Neuromuscular education;Energy conservation;DME and/or AE instruction;Therapeutic activities;Patient/family education;Balance training      OT Goals(Current goals can be found in the care plan section)   Acute Rehab OT Goals Patient Stated Goal: to go home OT Goal  Formulation: With patient Time For Goal Achievement: 04/09/24 Potential to Achieve Goals: Fair ADL Goals Pt Will Perform Grooming: with set-up;sitting Pt Will Perform Upper Body Dressing: with min assist;sitting Pt Will Transfer to Toilet: with min assist;bedside commode;stand pivot transfer   OT Frequency:  Min 2X/week    Co-evaluation PT/OT/SLP Co-Evaluation/Treatment: Yes Reason for Co-Treatment: For patient/therapist safety;To address functional/ADL transfers PT goals addressed during session: Mobility/safety with mobility;Balance;Proper use of DME OT goals addressed during session: ADL's and self-care      AM-PAC OT 6 Clicks Daily Activity     Outcome Measure Help from another person eating meals?: None Help from another person taking care of personal grooming?: None Help from another person toileting, which includes using toliet, bedpan, or urinal?: A Lot Help from another person bathing (including washing, rinsing, drying)?: A Lot Help from another person to put on and taking off regular upper body clothing?: A Little Help from another person to put on and taking off regular lower body clothing?: A Lot 6 Click Score: 17   End of Session Equipment Utilized During Treatment: Rolling walker (2 wheels) Nurse Communication: Mobility status  Activity Tolerance: Patient tolerated treatment well Patient left: in bed;with call bell/phone within reach;with family/visitor present  OT  Visit Diagnosis: Unsteadiness on feet (R26.81);Muscle weakness (generalized) (M62.81);Other abnormalities of gait and mobility (R26.89)                Time: 1051-1106 OT Time Calculation (min): 15 min Charges:  OT General Charges $OT Visit: 1 Visit OT Evaluation $OT Eval Low Complexity: 1 Low  Rogers Clause, OT/L MSOT, 03/26/2024

## 2024-03-26 NOTE — Assessment & Plan Note (Signed)
 Hold Lasix  with creatinine rise.  Patient on low-dose Coreg  and spironolactone .  Blood pressure on the lower side and may not tolerate more medication.

## 2024-03-26 NOTE — Hospital Course (Signed)
 84 y.o. male with medical history significant of extensive spine hardware, CAD, sCHF, DM2, Afib, recent cardiac arrest, recently started on 3L O2, who presented with shortness of breath and O2 desat.     Pt reported he was just discharged from [facility] yesterday (03/24/24).  He presented to [facility] on 03/10/24 initially for back pain, and suffered a cardiac arrest when he was laid flat for MRI scan.  Pt said due to hardware in his neck, lying flat causes him to stop breathing, and due to having received sedation for MRI, he wasn't able to prevent being laid flat.  During the hospitalization, pt said he was treated for PNA with 2 abx (completed), and was found to be pos for rhinovirus infection.  Pt was discharged with 3L home O2 and pt chose to go home instead of SNF rehab.   Pt said after he got home, he started having more shortness of breath.  Caregiver increased his O2 to 6L, but O2 sats were still 71%, so pt presented to our ED.   ED Course: initial vitals: afebrile, pulse 88, BP 146/66, RR 20, sating 91% on 3L.  Labs notable for WBC 18.6, pro BNP 5760, trop 40's.  Pt received vanc, cefepime  and doxy in the ED prior to admission.  1/16.  White blood cell count down to 10.1, creatinine 1.32.  Continue diuresis today and reassess kidney function tomorrow.  PT and OT evaluations.  Chest PT 1/17.  Hold Lasix  today with creatinine rise to 1.49.  Try to see if we can get off oxygen. 1/18.  Creatinine 1.32 with a GFR of 54.  Can go back on Lasix  20 mg daily as outpatient.  Pulse ox on room air ranged between 88% and 90%.  Patient was recently sent home on 3 L can cut back to 2 L.  Family has pulse ox at home and they will check his pulse ox.  Stable for discharge home.

## 2024-03-26 NOTE — Evaluation (Signed)
 Physical Therapy Evaluation Patient Details Name: Derek Andersen MRN: [id number] DOB: 03-15-1940 Today's Date: 03/26/2024  History of Present Illness  Derek Andersen is a 84 y.o. male with medical history significant of extensive spine hardware, CAD, sCHF, DM2, Afib, recent cardiac arrest, recently started on 3L O2, who presented with shortness of breath and O2 desat. Patient discharged from [facility] 1 day prior to readmission here.  Clinical Impression  Patient received in bed, family and caregiver at bedside. Patient requires mod +2 assist for bed mobility and is very stiff with mobility. He was able to partially stand with max +2 assist and from elevated bed height. Patient will continue to benefit from skilled PT to improve strength and functional independence.         If plan is discharge home, recommend the following: A lot of help with walking and/or transfers;A lot of help with bathing/dressing/bathroom   Can travel by private vehicle    no    Equipment Recommendations None recommended by PT  Recommendations for Other Services       Functional Status Assessment Patient has had a recent decline in their functional status and demonstrates the ability to make significant improvements in function in a reasonable and predictable amount of time.     Precautions / Restrictions Precautions Precautions: Fall Recall of Precautions/Restrictions: Intact Restrictions Weight Bearing Restrictions Per Provider Order: No      Mobility  Bed Mobility Overal bed mobility: Needs Assistance Bed Mobility: Supine to Sit, Sit to Supine     Supine to sit: Mod assist, +2 for physical assistance, HOB elevated Sit to supine: Mod assist, +2 for physical assistance   General bed mobility comments: patient very stiff, requires assistance to get seated edge of bed    Transfers Overall transfer level: Needs assistance Equipment used: Rolling walker (2 wheels) Transfers: Sit to/from Stand Sit  to Stand: Max assist, +2 physical assistance, From elevated surface           General transfer comment: patient is able to attempt standing 1x and is unable to get fully upright. Requires max cues and assist to perform.    Ambulation/Gait               General Gait Details: unable  Stairs            Wheelchair Mobility     Tilt Bed    Modified Rankin (Stroke Patients Only)       Balance Overall balance assessment: Needs assistance Sitting-balance support: Feet supported Sitting balance-Leahy Scale: Fair     Standing balance support: Bilateral upper extremity supported, During functional activity, Reliant on assistive device for balance Standing balance-Leahy Scale: Zero                               Pertinent Vitals/Pain Pain Assessment Pain Assessment: Faces Faces Pain Scale: Hurts a little bit Pain Location: back Pain Descriptors / Indicators: Discomfort Pain Intervention(s): Monitored during session, Repositioned    Home Living Family/patient expects to be discharged to:: Private residence Living Arrangements: Alone Available Help at Discharge: Personal care attendant;Available PRN/intermittently (has caregivers all day except for at night.) Type of Home: House           Home Equipment: Agricultural Consultant (2 wheels);Lift chair      Prior Function Prior Level of Function : Needs assist       Physical Assist : Mobility (physical);ADLs (physical) Mobility (physical):  Bed mobility;Transfers   Mobility Comments: Patient left [facility] able to stand and transfer with assistance ADLs Comments: Caregiver assistance     Extremity/Trunk Assessment   Upper Extremity Assessment Upper Extremity Assessment: Defer to OT evaluation    Lower Extremity Assessment Lower Extremity Assessment: Generalized weakness    Cervical / Trunk Assessment Cervical / Trunk Assessment: Neck Surgery;Back Surgery  Communication    Communication Communication: No apparent difficulties    Cognition Arousal: Alert Behavior During Therapy: WFL for tasks assessed/performed   PT - Cognitive impairments: No apparent impairments                         Following commands: Intact       Cueing Cueing Techniques: Verbal cues     General Comments      Exercises     Assessment/Plan    PT Assessment Patient needs continued PT services  PT Problem List Decreased strength;Decreased activity tolerance;Decreased balance;Decreased mobility;Cardiopulmonary status limiting activity       PT Treatment Interventions DME instruction;Functional mobility training;Therapeutic activities;Therapeutic exercise;Balance training;Neuromuscular re-education;Patient/family education    PT Goals (Current goals can be found in the Care Plan section)  Acute Rehab PT Goals Patient Stated Goal: return home PT Goal Formulation: With patient/family Time For Goal Achievement: 04/09/24 Potential to Achieve Goals: Good    Frequency Min 2X/week     Co-evaluation PT/OT/SLP Co-Evaluation/Treatment: Yes Reason for Co-Treatment: For patient/therapist safety;To address functional/ADL transfers PT goals addressed during session: Mobility/safety with mobility;Balance;Proper use of DME         AM-PAC PT 6 Clicks Mobility  Outcome Measure Help needed turning from your back to your side while in a flat bed without using bedrails?: A Lot Help needed moving from lying on your back to sitting on the side of a flat bed without using bedrails?: A Lot Help needed moving to and from a bed to a chair (including a wheelchair)?: A Lot Help needed standing up from a chair using your arms (e.g., wheelchair or bedside chair)?: A Lot Help needed to walk in hospital room?: Total Help needed climbing 3-5 steps with a railing? : Total 6 Click Score: 10    End of Session Equipment Utilized During Treatment: Oxygen Activity Tolerance: Patient  limited by fatigue Patient left: in bed;with call bell/phone within reach;with family/visitor present Nurse Communication: Mobility status PT Visit Diagnosis: Other abnormalities of gait and mobility (R26.89);Muscle weakness (generalized) (M62.81);Unsteadiness on feet (R26.81)    Time: 1051-1106 PT Time Calculation (min) (ACUTE ONLY): 15 min   Charges:   PT Evaluation $PT Eval Moderate Complexity: 1 Mod   PT General Charges $$ ACUTE PT VISIT: 1 Visit         Keriana Sarsfield, PT, GCS 03/26/24,1:11 PM

## 2024-03-26 NOTE — Assessment & Plan Note (Signed)
 Increased Lexapro  dose to 20 mg daily continue Wellbutrin .  As needed Xanax 

## 2024-03-26 NOTE — Assessment & Plan Note (Signed)
 Recent left 4th and 5th rib fracture

## 2024-03-27 DIAGNOSIS — I5023 Acute on chronic systolic (congestive) heart failure: Secondary | ICD-10-CM | POA: Diagnosis not present

## 2024-03-27 DIAGNOSIS — I82411 Acute embolism and thrombosis of right femoral vein: Secondary | ICD-10-CM

## 2024-03-27 DIAGNOSIS — M5489 Other dorsalgia: Secondary | ICD-10-CM

## 2024-03-27 DIAGNOSIS — N179 Acute kidney failure, unspecified: Secondary | ICD-10-CM | POA: Diagnosis not present

## 2024-03-27 DIAGNOSIS — G8929 Other chronic pain: Secondary | ICD-10-CM

## 2024-03-27 DIAGNOSIS — J188 Other pneumonia, unspecified organism: Secondary | ICD-10-CM | POA: Diagnosis not present

## 2024-03-27 DIAGNOSIS — J9621 Acute and chronic respiratory failure with hypoxia: Secondary | ICD-10-CM | POA: Diagnosis not present

## 2024-03-27 LAB — BASIC METABOLIC PANEL WITH GFR
Anion gap: 9 (ref 5–15)
BUN: 38 mg/dL — ABNORMAL HIGH (ref 8–23)
CO2: 29 mmol/L (ref 22–32)
Calcium: 9 mg/dL (ref 8.9–10.3)
Chloride: 93 mmol/L — ABNORMAL LOW (ref 98–111)
Creatinine, Ser: 1.49 mg/dL — ABNORMAL HIGH (ref 0.61–1.24)
GFR, Estimated: 47 mL/min — ABNORMAL LOW
Glucose, Bld: 203 mg/dL — ABNORMAL HIGH (ref 70–99)
Potassium: 4.5 mmol/L (ref 3.5–5.1)
Sodium: 130 mmol/L — ABNORMAL LOW (ref 135–145)

## 2024-03-27 LAB — GLUCOSE, CAPILLARY
Glucose-Capillary: 170 mg/dL — ABNORMAL HIGH (ref 70–99)
Glucose-Capillary: 270 mg/dL — ABNORMAL HIGH (ref 70–99)
Glucose-Capillary: 259 mg/dL — ABNORMAL HIGH (ref 70–99)
Glucose-Capillary: 301 mg/dL — ABNORMAL HIGH (ref 70–99)

## 2024-03-27 MED ORDER — GABAPENTIN 300 MG PO CAPS
300.0000 mg | ORAL_CAPSULE | Freq: Every morning | ORAL | Status: DC
  Administered 2024-03-27 – 2024-03-28 (×2): 300 mg via ORAL
  Filled 2024-03-27 (×2): qty 1

## 2024-03-27 MED ORDER — POLYETHYLENE GLYCOL 3350 17 G PO PACK
17.0000 g | PACK | Freq: Every day | ORAL | Status: DC
  Administered 2024-03-27 – 2024-03-28 (×2): 17 g via ORAL
  Filled 2024-03-27 (×2): qty 1

## 2024-03-27 MED ORDER — SENNOSIDES-DOCUSATE SODIUM 8.6-50 MG PO TABS
2.0000 | ORAL_TABLET | Freq: Every day | ORAL | Status: DC
  Filled 2024-03-27: qty 2

## 2024-03-27 MED ORDER — BRIMONIDINE TARTRATE 0.2 % OP SOLN
1.0000 [drp] | Freq: Three times a day (TID) | OPHTHALMIC | Status: DC
  Administered 2024-03-27 – 2024-03-28 (×3): 1 [drp] via OPHTHALMIC
  Filled 2024-03-27 (×2): qty 5

## 2024-03-27 NOTE — Assessment & Plan Note (Signed)
 Patient with a creatinine of 1.13 on presentation.  Creatinine rise up to 1.49 with diuresis.  Hold diuresis today.

## 2024-03-27 NOTE — Progress Notes (Signed)
 " Progress Note   Patient: Derek Andersen FMW:[id number] DOB: 21-Mar-1940 DOA: 03/25/2024     1 DOS: the patient was seen and examined on 03/27/2024   Brief hospital course: 84 y.o. male with medical history significant of extensive spine hardware, CAD, sCHF, DM2, Afib, recent cardiac arrest, recently started on 3L O2, who presented with shortness of breath and O2 desat.     Pt reported he was just discharged from [facility] yesterday (03/24/24).  He presented to [facility] on 03/10/24 initially for back pain, and suffered a cardiac arrest when he was laid flat for MRI scan.  Pt said due to hardware in his neck, lying flat causes him to stop breathing, and due to having received sedation for MRI, he wasn't able to prevent being laid flat.  During the hospitalization, pt said he was treated for PNA with 2 abx (completed), and was found to be pos for rhinovirus infection.  Pt was discharged with 3L home O2 and pt chose to go home instead of SNF rehab.   Pt said after he got home, he started having more shortness of breath.  Caregiver increased his O2 to 6L, but O2 sats were still 71%, so pt presented to our ED.   ED Course: initial vitals: afebrile, pulse 88, BP 146/66, RR 20, sating 91% on 3L.  Labs notable for WBC 18.6, pro BNP 5760, trop 40's.  Pt received vanc, cefepime  and doxy in the ED prior to admission.  1/16.  White blood cell count down to 10.1, creatinine 1.32.  Continue diuresis today and reassess kidney function tomorrow.  PT and OT evaluations.  Chest PT 1/17.  Hold Lasix  today with creatinine rise to 1.49.  Try to see if we can get off oxygen.   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  Prior to that had not been on oxygen.  Family had to increase up to 5 L of oxygen at home secondary to hypoxia with pulse ox in the 80s.  Will try to see if we can get him off the oxygen completely.  AKI (acute kidney  injury) Patient with a creatinine of 1.13 on presentation.  Creatinine rise up to 1.49 with diuresis.  Hold diuresis today.  Acute on chronic systolic CHF (congestive heart failure) (HCC) Hold Lasix  with creatinine rise.  Patient on low-dose Coreg  and spironolactone .  Blood pressure on the lower side and may not tolerate more medication.  Multifocal pneumonia Hold off on further antibiotics since received antibiotics [facility] at [facility].  Recent rhinovirus infection.  Rib fractures Recent left 4th and 5th rib fracture  DVT (deep venous thrombosis) (HCC) Right femoral artery on Eliquis   Paroxysmal atrial fibrillation (HCC) On Coreg  and Eliquis   Uncontrolled type 2 diabetes mellitus with hyperglycemia, with long-term current use of insulin  (HCC) Continue Lantus  insulin  and sliding scale.  Chronic back pain On gabapentin  300 mg in the morning and 600 mg at night  Depression with anxiety Increased Lexapro  dose to 20 mg daily continue Wellbutrin .  As needed Xanax   Obesity (BMI 30-39.9) Class I with a BMI of 32.28  Pressure injury of skin Wound 03/25/24 1400 Pressure Injury Sacrum Mid Stage 2 -  Partial thickness loss of dermis presenting as a shallow open injury with a red, pink wound bed without slough. (Active)     Wound 03/25/24 1400 Pressure Injury Buttocks Right Stage 1 -  Intact skin with non-blanchable redness of a  localized area usually over a bony prominence. (Active)   Present on admission        Subjective: Patient told me he is feeling better today.  Breathing better.  He told his daughter about having some back pain but he did not tell me that.  He did ask for the morning gabapentin .  I ordered that.  Holding Lasix  today with creatinine rise.  Admitted with CHF exacerbation  Physical Exam: Vitals:   03/26/24 1941 03/26/24 2327 03/27/24 0240 03/27/24 0747  BP: (!) 105/47 (!) 98/50 (!) 108/51 (!) 110/54  Pulse: 76 73 77 75  Resp: 16 16 16 18   Temp: 98.4 F (36.9 C)  99.1 F (37.3 C) 98 F (36.7 C) 98.5 F (36.9 C)  TempSrc:  Oral    SpO2: 98% 97% 94% 98%  Weight:      Height:       Physical Exam HENT:     Head: Normocephalic.     Mouth/Throat:     Pharynx: No oropharyngeal exudate.  Eyes:     General: Lids are normal.     Conjunctiva/sclera: Conjunctivae normal.  Cardiovascular:     Rate and Rhythm: Normal rate and regular rhythm.     Heart sounds: Normal heart sounds, S1 normal and S2 normal.  Pulmonary:     Breath sounds: Examination of the right-lower field reveals decreased breath sounds. Examination of the left-lower field reveals decreased breath sounds. Decreased breath sounds present. No wheezing, rhonchi or rales.  Abdominal:     Palpations: Abdomen is soft.     Tenderness: There is no abdominal tenderness.  Musculoskeletal:     Right lower leg: No swelling.     Left lower leg: No swelling.  Skin:    General: Skin is warm.  Neurological:     Mental Status: He is alert and oriented to person, place, and time.     Comments: Patient able to straight leg raise for me today.     Data Reviewed: Sodium 130, creatinine 1.49, glucose 203  Family Communication: Spoke with daughter on the phone  Disposition: Status is: Inpatient Remains inpatient appropriate because: Hold IV diuresis today secondary to creatinine rise.  Recheck creatinine tomorrow.  Planned Discharge Destination: Home with Home Health    Time spent: 28 minutes  Author: Charlie Patterson, MD 03/27/2024 11:09 AM  For on call review [email or web address].  "

## 2024-03-27 NOTE — Plan of Care (Signed)

## 2024-03-28 DIAGNOSIS — N179 Acute kidney failure, unspecified: Secondary | ICD-10-CM | POA: Diagnosis not present

## 2024-03-28 DIAGNOSIS — Z794 Long term (current) use of insulin: Secondary | ICD-10-CM | POA: Diagnosis not present

## 2024-03-28 DIAGNOSIS — G8929 Other chronic pain: Secondary | ICD-10-CM | POA: Diagnosis not present

## 2024-03-28 DIAGNOSIS — I82401 Acute embolism and thrombosis of unspecified deep veins of right lower extremity: Secondary | ICD-10-CM

## 2024-03-28 DIAGNOSIS — J188 Other pneumonia, unspecified organism: Secondary | ICD-10-CM | POA: Diagnosis not present

## 2024-03-28 DIAGNOSIS — E1165 Type 2 diabetes mellitus with hyperglycemia: Secondary | ICD-10-CM | POA: Diagnosis not present

## 2024-03-28 DIAGNOSIS — M5489 Other dorsalgia: Secondary | ICD-10-CM | POA: Diagnosis not present

## 2024-03-28 DIAGNOSIS — I480 Paroxysmal atrial fibrillation: Secondary | ICD-10-CM | POA: Diagnosis not present

## 2024-03-28 DIAGNOSIS — J9621 Acute and chronic respiratory failure with hypoxia: Secondary | ICD-10-CM | POA: Diagnosis not present

## 2024-03-28 DIAGNOSIS — I5023 Acute on chronic systolic (congestive) heart failure: Secondary | ICD-10-CM | POA: Diagnosis not present

## 2024-03-28 DIAGNOSIS — F418 Other specified anxiety disorders: Secondary | ICD-10-CM | POA: Diagnosis not present

## 2024-03-28 DIAGNOSIS — E669 Obesity, unspecified: Secondary | ICD-10-CM | POA: Diagnosis not present

## 2024-03-28 LAB — BASIC METABOLIC PANEL WITH GFR
Anion gap: 8 (ref 5–15)
BUN: 42 mg/dL — ABNORMAL HIGH (ref 8–23)
CO2: 30 mmol/L (ref 22–32)
Calcium: 9 mg/dL (ref 8.9–10.3)
Chloride: 98 mmol/L (ref 98–111)
Creatinine, Ser: 1.32 mg/dL — ABNORMAL HIGH (ref 0.61–1.24)
GFR, Estimated: 54 mL/min — ABNORMAL LOW
Glucose, Bld: 138 mg/dL — ABNORMAL HIGH (ref 70–99)
Potassium: 4.3 mmol/L (ref 3.5–5.1)
Sodium: 136 mmol/L (ref 135–145)

## 2024-03-28 LAB — GLUCOSE, CAPILLARY: Glucose-Capillary: 176 mg/dL — ABNORMAL HIGH (ref 70–99)

## 2024-03-28 MED ORDER — POLYETHYLENE GLYCOL 3350 17 G PO PACK: 17.0000 g | PACK | Freq: Every day | ORAL | 0 refills | Status: AC | PRN

## 2024-03-28 MED ORDER — SPIRONOLACTONE 25 MG PO TABS: 12.5000 mg | ORAL_TABLET | Freq: Every day | ORAL | Status: AC

## 2024-03-28 MED ORDER — ALPRAZOLAM 0.25 MG PO TABS: 0.2500 mg | ORAL_TABLET | Freq: Two times a day (BID) | ORAL | 0 refills | Status: AC | PRN

## 2024-03-28 MED ORDER — HYDROCODONE-ACETAMINOPHEN 5-325 MG PO TABS: 1.0000 | ORAL_TABLET | Freq: Four times a day (QID) | ORAL | 0 refills | Status: AC | PRN

## 2024-03-28 MED ORDER — GABAPENTIN 300 MG PO CAPS: ORAL_CAPSULE | ORAL | 0 refills | Status: AC

## 2024-03-28 MED ORDER — ESCITALOPRAM OXALATE 20 MG PO TABS: 20.0000 mg | ORAL_TABLET | Freq: Every day | ORAL | 0 refills | Status: AC

## 2024-03-28 NOTE — TOC Transition Note (Signed)
 Transition of Care [facility]) - Discharge Note   Patient Details  Name: Derek Andersen MRN: [id number] Date of Birth: 1940-07-31  [facility] CM/SW Contact:  Kamaya Keckler L Braleigh Massoud, LCSW Phone Number: 03/28/2024, 10:16 AM   Clinical Narrative:     Patient is medically ready for discharge home. Home Health services established with [facility]. Resumption orders in place and [facility] notified of discharge. They will follow-up with 48 hours after discharge.   CSW spoke with patients son, [name], and notified him of discharge and transportation home. [facility] was called and transportation was scheduled for pick up within the hour. [name] advised that he will notify patients caregiver of discharge and the caregiver will be at the home waiting for patient arrival.   No DME needed patient has required DME at home. Family will follow-up with PCP regarding electric wheelchair.   No further TOC needs identified. TOC signing off.     Barriers to Discharge: Continued Medical Work up   Patient Goals and CMS Choice Patient states their goals for this hospitalization and ongoing recovery are:: Wants to go home- has caregivers at home CMS Medicare.gov Compare Post Acute Care list provided to:: Patient Choice offered to / list presented to : Patient [place] ownership interest in [facility].provided to:: Patient    Discharge Placement                       Discharge Plan and Services Additional resources added to the After Visit Summary for     Discharge Planning Services: CM Consult Post Acute Care Choice: Home Health          DME Arranged: N/A         HH Arranged: RN, PT, OT [facility] Agency: [facility] Date [facility] Agency Contacted: 03/26/24 Time HH Agency Contacted: [phone]    Social Drivers of Health (SDOH) Interventions SDOH Screenings   Food Insecurity: No Food Insecurity (03/25/2024)  Housing: Low Risk (03/25/2024)  Transportation  Needs: No Transportation Needs (03/25/2024)  Utilities: Not At Risk (03/25/2024)  Financial Resource Strain: Low Risk  (07/14/2023)   Received from [facility]  Social Connections: Socially Isolated (03/25/2024)  Stress: Stress Concern Present (08/13/2023)   Received from [facility]  Tobacco Use: Low Risk (03/25/2024)     Readmission Risk Interventions     No data to display

## 2024-03-28 NOTE — Discharge Summary (Signed)
 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 *  Hospital Course: 84 y.o. male with medical history significant of extensive spine hardware, CAD, sCHF, DM2, Afib, recent cardiac arrest, recently started on 3L O2, who presented with shortness of breath and O2 desat.     Pt reported he was just discharged from [facility] yesterday (03/24/24).  He presented to [facility] on 03/10/24 initially for back pain, and suffered a cardiac arrest when he was laid flat for MRI scan.  Pt said due to hardware in his neck, lying flat causes him to stop breathing, and due to having received sedation for MRI, he wasn't able to prevent being laid flat.  During the hospitalization, pt said he was treated for PNA with 2 abx (completed), and was found to be pos for rhinovirus infection.  Pt was discharged with 3L home O2 and pt chose to go home instead of SNF rehab.   Pt said after he got home, he started having more shortness of breath.  Caregiver increased his O2 to 6L, but O2  sats were still 71%, so pt presented to our ED.   ED Course: initial vitals: afebrile, pulse 88, BP 146/66, RR 20, sating 91% on 3L.  Labs notable for WBC 18.6, pro BNP 5760, trop 40's.  Pt received vanc, cefepime  and doxy in the ED prior to admission.  1/16.  White blood cell count down to 10.1, creatinine 1.32.  Continue diuresis today and reassess kidney function tomorrow.  PT and OT evaluations.  Chest PT 1/17.  Hold Lasix  today with creatinine rise to 1.49.  Try to see if we can get off oxygen. 1/18.  Creatinine 1.32 with a GFR of 54.  Can go back on Lasix  20 mg daily as outpatient.  Pulse ox on room air ranged between 88% and 90%.  Patient was recently sent home on 3 L can cut back to 2 L.  Family has pulse ox at home and they will check his pulse ox.  Stable for discharge home.  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  Prior to that had not been on oxygen.  Family had to increase up to 5 L of oxygen at home secondary to hypoxia with pulse ox in the 80s.  Pulse ox today ranged between 88% and 90%.  Can go back down to 2 L at home of oxygen.  Hopefully can get off oxygen in the future.  Family has pulse ox  at home and if it is less than 88% can be on 2 L.  Family states that he does have a CPAP at home.  Will likely need oxygen at night and/or the CPAP.  AKI (acute kidney injury) Patient with a creatinine of 1.13 on presentation.  Creatinine rise up to 1.49 with diuresis.  Hold diuresis today.  Creatinine 1.32 on discharge.  Can go back on Lasix  20 mg daily as outpatient.  Acute on chronic systolic CHF (congestive heart failure) (HCC) Hold Lasix  today.  Patient on low-dose Coreg  and spironolactone .  Can go back on Jardiance and benazepril  as outpatient.  Restart Lasix  20 mg orally daily tomorrow.  Recent echo at [facility] shows his estimated EF around 30%.  Multifocal pneumonia Hold off on further antibiotics since  received antibiotics while at [facility].  Recent rhinovirus infection.  Rib fractures Recent left 4th and 5th rib fracture  DVT (deep venous thrombosis) (HCC) Right femoral vein on Eliquis   Paroxysmal atrial fibrillation (HCC) On Coreg  and Eliquis   Uncontrolled type 2 diabetes mellitus with hyperglycemia, with long-term current use of insulin  (HCC) Continue Lantus  insulin  and sliding scale.  Can go back on Jardiance as outpatient.  Chronic back pain On gabapentin  300 mg in the morning and 600 mg at night.  Prescription written for increased dose.  Depression with anxiety Increased Lexapro  dose to 20 mg daily continue Wellbutrin .  As needed Xanax   Obesity (BMI 30-39.9) Class I with a BMI of 32.28  Pressure injury of skin Wound 03/25/24 1400 Pressure Injury Sacrum Mid Stage 2 -  Partial thickness loss of dermis presenting as a shallow open injury with a red, pink wound bed without slough. (Active)     Wound 03/25/24 1400 Pressure Injury Buttocks Right Stage 1 -  Intact skin with non-blanchable redness of a localized area usually over a bony prominence. (Active)   Present on admission         Consultants: None Procedures performed: None Disposition: Home health Diet recommendation:  Cardiac and Carb modified diet DISCHARGE MEDICATION: Allergies as of 03/28/2024       Reactions   Oxycodone     Patient states you don't know where you are at, and it screws you up. It also gives crazy dreams.   Prednisone Other (See Comments)   Memory loss   Latex         Medication List     STOP taking these medications    amLODipine  5 MG tablet Commonly known as: NORVASC    dabigatran  75 MG Caps capsule Commonly known as: PRADAXA    Dulaglutide 4.5 MG/0.5ML Soaj   ketoconazole 2 % shampoo Commonly known as: NIZORAL       TAKE these medications    acetaminophen  325 MG tablet Commonly known as: TYLENOL  Take 650 mg by mouth every 6 (six) hours as needed for headache.    ALPRAZolam  0.25 MG tablet Commonly known as: XANAX  Take 1 tablet (0.25 mg total) by mouth 2 (two) times daily as needed for anxiety.   apixaban  5 MG Tabs tablet Commonly known as: ELIQUIS  Take 5 mg by mouth 2 (two) times daily.   aspirin  EC 81 MG tablet Take 81 mg by mouth daily.   Basaglar  KwikPen 100 UNIT/ML Inject 30 Units into the skin daily.   benazepril  10 MG tablet Commonly known as: LOTENSIN  Take 10 mg by mouth daily.   brimonidine  0.2 % ophthalmic solution Commonly known as: ALPHAGAN  Place 1 drop into both eyes 3 (three) times daily.  buPROPion  300 MG 24 hr tablet Commonly known as: WELLBUTRIN  XL Take 300 mg by mouth daily.   calcium carbonate 750 MG chewable tablet Commonly known as: TUMS EX Chew 750 mg by mouth in the morning and at bedtime.   carvedilol  3.125 MG tablet Commonly known as: COREG  Take 3.125 mg by mouth 2 (two) times daily with a meal.   Cholecalciferol  25 MCG (1000 UT) capsule Take 1 tablet by mouth daily.   cyanocobalamin 1000 MCG tablet Commonly known as: VITAMIN B12 Take 1,000 mcg by mouth daily.   diclofenac  Sodium 1 % Gel Commonly known as: VOLTAREN  Apply 2 g topically in the morning and at bedtime.   escitalopram  20 MG tablet Commonly known as: LEXAPRO  Take 1 tablet (20 mg total) by mouth daily. What changed:  medication strength how much to take   ferrous sulfate  325 (65 FE) MG tablet Take 325 mg by mouth daily.   finasteride  5 MG tablet Commonly known as: PROSCAR  Take 5 mg by mouth daily.   Fish Oil 1200 MG Caps Take 2,400 mg by mouth in the morning and at bedtime.   fluticasone  50 MCG/ACT nasal spray Commonly known as: FLONASE  Place 2 sprays into both nostrils daily.   furosemide  20 MG tablet Commonly known as: LASIX  Take 20 mg by mouth daily.   gabapentin  300 MG capsule Commonly known as: NEURONTIN  One tab po qam and 2 tabs po in evening What changed:  how much to take how to take this when to take  this additional instructions   HYDROcodone -acetaminophen  5-325 MG tablet Commonly known as: NORCO/VICODIN Take 1 tablet by mouth every 6 (six) hours as needed for severe pain (pain score 7-10).   hydrocortisone 2.5 % cream Apply 1 Application topically 2 (two) times daily as needed (facial rash).   insulin  aspart 100 UNIT/ML injection Commonly known as: novoLOG  Inject 0-50 Units into the skin 3 (three) times daily before meals.   Jardiance 25 MG Tabs tablet Generic drug: empagliflozin Take 25 mg by mouth daily.   magnesium  oxide 400 MG tablet Commonly known as: MAG-OX Take 400 mg by mouth daily.   multivitamin with minerals Tabs tablet Take 1 tablet by mouth daily.   pantoprazole  40 MG tablet Commonly known as: PROTONIX  Take 40 mg by mouth daily.   polyethylene glycol 17 g packet Commonly known as: MIRALAX  / GLYCOLAX  Take 17 g by mouth daily as needed.   pravastatin  40 MG tablet Commonly known as: PRAVACHOL  Take 40 mg by mouth daily.   spironolactone  25 MG tablet Commonly known as: ALDACTONE  Take 0.5 tablets (12.5 mg total) by mouth daily. What changed: how much to take   tamsulosin  0.4 MG Caps capsule Commonly known as: FLOMAX  Take 0.4 mg by mouth daily.        Contact information for follow-up providers     Diedra Lame, MD Follow up in 5 day(s).   Specialty: Family Medicine Contact information: [address] [facility] [place] [address] [phone]              Contact information for after-discharge care     Home Medical Care     [facility]) .   Service: Home Health Services Contact information: [address] [place]  [address] [phone]                    Discharge Exam: Derek Andersen  03/24/24 2237  Weight: 90.7 kg   Physical Exam HENT:     Head: Normocephalic.     Mouth/Throat:     Pharynx: No  oropharyngeal exudate.  Eyes:     General: Lids are normal.     Conjunctiva/sclera: Conjunctivae normal.  Cardiovascular:     Rate and Rhythm: Normal rate and regular rhythm.     Heart sounds: Normal heart sounds, S1 normal and S2 normal.  Pulmonary:     Breath sounds: Examination of the right-lower field reveals decreased breath sounds. Examination of the left-lower field reveals decreased breath sounds. Decreased breath sounds present. No wheezing, rhonchi or rales.  Abdominal:     Palpations: Abdomen is soft.     Tenderness: There is no abdominal tenderness.  Musculoskeletal:     Right lower leg: No swelling.     Left lower leg: No swelling.  Skin:    General: Skin is warm.  Neurological:     Mental Status: He is alert and oriented to person, place, and time.     Comments: Patient able to straight leg raise for me today.      Condition at discharge: stable  The results of significant diagnostics from this hospitalization (including imaging, microbiology, ancillary and laboratory) are listed below for reference.   Imaging Studies: DG Chest 1 View Result Date: 03/24/2024 EXAM: 1 VIEW(S) XRAY OF THE CHEST 03/24/2024 11:11:00 PM COMPARISON: 06/26/2023 CLINICAL HISTORY: SOB (shortness of breath) FINDINGS: LUNGS AND PLEURA: Low lung volumes. Left basilar airspace opacity. Right upper lobe airspace opacity. Small left pleural effusion. No pneumothorax. HEART AND MEDIASTINUM: No acute abnormality of the cardiac and mediastinal silhouettes. BONES AND SOFT TISSUES: Thoracic surgical hardware noted. No acute osseous abnormality. IMPRESSION: 1. Left basilar and right upper lobe airspace opacities. 2. Small left pleural effusion. 3. Low lung volumes. Electronically signed by: Morgane Naveau MD 03/24/2024 11:15 PM EST RP Workstation: [id number]    Microbiology: Results for orders placed or performed during the hospital encounter of 03/25/24  Resp panel by RT-PCR (RSV, Flu A&B, Covid) Anterior  Nasal Swab     Status: None   Collection Time: 03/25/24  2:56 AM   Specimen: Anterior Nasal Swab  Result Value Ref Range Status   SARS Coronavirus 2 by RT PCR NEGATIVE NEGATIVE Final    Comment: (NOTE) SARS-CoV-2 target nucleic acids are NOT DETECTED.  The SARS-CoV-2 RNA is generally detectable in upper respiratory specimens during the acute phase of infection. The lowest concentration of SARS-CoV-2 viral copies this assay can detect is 138 copies/mL. A negative result does not preclude SARS-Cov-2 infection and should not be used as the sole basis for treatment or other patient management decisions. A negative result may occur with  improper specimen collection/handling, submission of specimen other than nasopharyngeal swab, presence of viral mutation(s) within the areas targeted by this assay, and inadequate number of viral copies(<138 copies/mL). A negative result must be combined with clinical observations, patient history, and epidemiological information. The expected result is Negative.  Fact Sheet for Patients:  bloggercourse.com  Fact Sheet for Healthcare Providers:  seriousbroker.it  This test is no t yet approved or cleared by the [place]  FDA and  has been authorized for detection and/or diagnosis of SARS-CoV-2 by FDA under an Emergency Use Authorization (EUA). This EUA will remain  in effect (meaning this test can be used) for the duration of the COVID-19 declaration under Section 564(b)(1) of the Act, 21 U.S.C.section 360bbb-3(b)(1), unless the authorization is terminated  or  revoked sooner.       Influenza A by PCR NEGATIVE NEGATIVE Final   Influenza B by PCR NEGATIVE NEGATIVE Final    Comment: (NOTE) The Xpert Xpress SARS-CoV-2/FLU/RSV plus assay is intended as an aid in the diagnosis of influenza from Nasopharyngeal swab specimens and should not be used as a sole basis for treatment. Nasal washings  and aspirates are unacceptable for Xpert Xpress SARS-CoV-2/FLU/RSV testing.  Fact Sheet for Patients: bloggercourse.com  Fact Sheet for Healthcare Providers: seriousbroker.it  This test is not yet approved or cleared by the [place]  FDA and has been authorized for detection and/or diagnosis of SARS-CoV-2 by FDA under an Emergency Use Authorization (EUA). This EUA will remain in effect (meaning this test can be used) for the duration of the COVID-19 declaration under Section 564(b)(1) of the Act, 21 U.S.C. section 360bbb-3(b)(1), unless the authorization is terminated or revoked.     Resp Syncytial Virus by PCR NEGATIVE NEGATIVE Final    Comment: (NOTE) Fact Sheet for Patients: bloggercourse.com  Fact Sheet for Healthcare Providers: seriousbroker.it  This test is not yet approved or cleared by the [place]  FDA and has been authorized for detection and/or diagnosis of SARS-CoV-2 by FDA under an Emergency Use Authorization (EUA). This EUA will remain in effect (meaning this test can be used) for the duration of the COVID-19 declaration under Section 564(b)(1) of the Act, 21 U.S.C. section 360bbb-3(b)(1), unless the authorization is terminated or revoked.  Performed at [facility], [address] [place], [place] [address]   Blood culture (routine x 2)     Status: None (Preliminary result)   Collection Time: 03/25/24  2:56 AM   Specimen: BLOOD RIGHT HAND  Result Value Ref Range Status   Specimen Description BLOOD RIGHT HAND  Final   Special Requests   Final    BOTTLES DRAWN AEROBIC AND ANAEROBIC Blood Culture results may not be optimal due to an inadequate volume of blood received in culture bottles   Culture   Final    NO GROWTH 3 DAYS Performed at [facility], [address] [place], [place] [address]    Report Status PENDING  Incomplete   Blood culture (routine x 2)     Status: None (Preliminary result)   Collection Time: 03/25/24  2:57 AM   Specimen: BLOOD RIGHT ARM  Result Value Ref Range Status   Specimen Description BLOOD RIGHT ARM  Final   Special Requests   Final    BOTTLES DRAWN AEROBIC AND ANAEROBIC Blood Culture results may not be optimal due to an inadequate volume of blood received in culture bottles   Culture   Final    NO GROWTH 3 DAYS Performed at [facility], [address] [place], [place] [address]    Report Status PENDING  Incomplete  MRSA Next Gen by PCR, Nasal     Status: Abnormal   Collection Time: 03/25/24  5:43 AM   Specimen: Nasal Mucosa; Nasal Swab  Result Value Ref Range Status   MRSA by PCR Next Gen DETECTED (A) NOT DETECTED Final    Comment: RESULT CALLED TO, READ BACK BY AND VERIFIED WITH: HEATHER FISHER 03/25/24 1015 MW (NOTE) The GeneXpert MRSA Assay (FDA approved for NASAL specimens only), is one component of a comprehensive MRSA colonization surveillance program. It is not intended to diagnose MRSA infection nor to guide or monitor treatment for MRSA infections. Test performance is not FDA approved in patients less than 35 years old. Performed at  [facility], [address] [place], [place] [address]     Labs: CBC: Recent Labs  Lab 03/24/24 2240 03/26/24 0823  WBC 18.6* 10.1  HGB 9.7* 9.7*  HCT 31.3* 30.7*  MCV 93.7 92.2  PLT 479* 437*   Basic Metabolic Panel: Recent Labs  Lab 03/24/24 2240 03/26/24 0823 03/27/24 0612 03/28/24 0540  NA 137 136 130* 136  K 4.6 4.3 4.5 4.3  CL 99 96* 93* 98  CO2 30 31 29 30   GLUCOSE 230* 154* 203* 138*  BUN 23 25* 38* 42*  CREATININE 1.13 1.32* 1.49* 1.32*  CALCIUM 9.5 9.6 9.0 9.0  MG  --  1.8  --   --    Liver Function Tests: No results for input(s): AST, ALT, ALKPHOS, BILITOT, PROT, ALBUMIN in the last 168 hours. CBG: Recent Labs  Lab 03/27/24 0749 03/27/24 1151 03/27/24 1715  03/27/24 2040 03/28/24 0907  GLUCAP 170* 270* 259* 301* 176*    Discharge time spent: greater than 30 minutes.  Signed: Charlie Patterson, MD [facility] 03/28/2024 "

## 2024-03-28 NOTE — Discharge Instructions (Addendum)
 Follow up with your cardiologist and back surgeon Can wear 2L oxygen  Resumption orders have been sent to [facility] and they will resume services in your home. They have been notified of your discharge. Please expect to hear from them within 48 hours after discharge.

## 2024-03-30 LAB — CULTURE, BLOOD (ROUTINE X 2)
Culture: NO GROWTH
Culture: NO GROWTH

## 2024-04-01 ENCOUNTER — Emergency Department

## 2024-04-01 ENCOUNTER — Encounter: Payer: Self-pay | Admitting: Emergency Medicine

## 2024-04-01 ENCOUNTER — Other Ambulatory Visit: Payer: Self-pay

## 2024-04-01 ENCOUNTER — Inpatient Hospital Stay: Admission: EM | Admit: 2024-04-01 | Discharge: 2024-04-09 | DRG: 177 | Disposition: A | Discharge status: Home Health

## 2024-04-01 ENCOUNTER — Inpatient Hospital Stay

## 2024-04-01 DIAGNOSIS — Z86718 Personal history of other venous thrombosis and embolism: Secondary | ICD-10-CM

## 2024-04-01 DIAGNOSIS — G8929 Other chronic pain: Secondary | ICD-10-CM | POA: Diagnosis present

## 2024-04-01 DIAGNOSIS — J189 Pneumonia, unspecified organism: Secondary | ICD-10-CM | POA: Diagnosis present

## 2024-04-01 DIAGNOSIS — I2510 Atherosclerotic heart disease of native coronary artery without angina pectoris: Secondary | ICD-10-CM | POA: Diagnosis present

## 2024-04-01 DIAGNOSIS — D509 Iron deficiency anemia, unspecified: Secondary | ICD-10-CM | POA: Diagnosis present

## 2024-04-01 DIAGNOSIS — J9622 Acute and chronic respiratory failure with hypercapnia: Secondary | ICD-10-CM | POA: Diagnosis present

## 2024-04-01 DIAGNOSIS — Z6835 Body mass index (BMI) 35.0-35.9, adult: Secondary | ICD-10-CM

## 2024-04-01 DIAGNOSIS — E1140 Type 2 diabetes mellitus with diabetic neuropathy, unspecified: Secondary | ICD-10-CM | POA: Diagnosis present

## 2024-04-01 DIAGNOSIS — Z1152 Encounter for screening for COVID-19: Secondary | ICD-10-CM | POA: Diagnosis not present

## 2024-04-01 DIAGNOSIS — J690 Pneumonitis due to inhalation of food and vomit: Secondary | ICD-10-CM | POA: Diagnosis present

## 2024-04-01 DIAGNOSIS — E66812 Obesity, class 2: Secondary | ICD-10-CM | POA: Diagnosis present

## 2024-04-01 DIAGNOSIS — R4182 Altered mental status, unspecified: Secondary | ICD-10-CM | POA: Diagnosis not present

## 2024-04-01 DIAGNOSIS — I5022 Chronic systolic (congestive) heart failure: Secondary | ICD-10-CM | POA: Diagnosis present

## 2024-04-01 DIAGNOSIS — Z794 Long term (current) use of insulin: Secondary | ICD-10-CM

## 2024-04-01 DIAGNOSIS — Z66 Do not resuscitate: Secondary | ICD-10-CM | POA: Diagnosis present

## 2024-04-01 DIAGNOSIS — F419 Anxiety disorder, unspecified: Secondary | ICD-10-CM | POA: Diagnosis present

## 2024-04-01 DIAGNOSIS — F32A Depression, unspecified: Secondary | ICD-10-CM | POA: Diagnosis present

## 2024-04-01 DIAGNOSIS — J9621 Acute and chronic respiratory failure with hypoxia: Principal | ICD-10-CM

## 2024-04-01 DIAGNOSIS — E785 Hyperlipidemia, unspecified: Secondary | ICD-10-CM | POA: Diagnosis present

## 2024-04-01 DIAGNOSIS — Z79899 Other long term (current) drug therapy: Secondary | ICD-10-CM

## 2024-04-01 DIAGNOSIS — N400 Enlarged prostate without lower urinary tract symptoms: Secondary | ICD-10-CM | POA: Diagnosis present

## 2024-04-01 DIAGNOSIS — R230 Cyanosis: Secondary | ICD-10-CM | POA: Diagnosis present

## 2024-04-01 DIAGNOSIS — Y95 Nosocomial condition: Secondary | ICD-10-CM | POA: Diagnosis not present

## 2024-04-01 DIAGNOSIS — N189 Chronic kidney disease, unspecified: Secondary | ICD-10-CM | POA: Diagnosis present

## 2024-04-01 DIAGNOSIS — I2489 Other forms of acute ischemic heart disease: Secondary | ICD-10-CM | POA: Diagnosis present

## 2024-04-01 DIAGNOSIS — G4733 Obstructive sleep apnea (adult) (pediatric): Secondary | ICD-10-CM | POA: Diagnosis present

## 2024-04-01 DIAGNOSIS — I252 Old myocardial infarction: Secondary | ICD-10-CM

## 2024-04-01 DIAGNOSIS — I130 Hypertensive heart and chronic kidney disease with heart failure and stage 1 through stage 4 chronic kidney disease, or unspecified chronic kidney disease: Secondary | ICD-10-CM | POA: Diagnosis present

## 2024-04-01 DIAGNOSIS — E1122 Type 2 diabetes mellitus with diabetic chronic kidney disease: Secondary | ICD-10-CM | POA: Diagnosis present

## 2024-04-01 DIAGNOSIS — Z7901 Long term (current) use of anticoagulants: Secondary | ICD-10-CM

## 2024-04-01 DIAGNOSIS — Z7984 Long term (current) use of oral hypoglycemic drugs: Secondary | ICD-10-CM

## 2024-04-01 DIAGNOSIS — I480 Paroxysmal atrial fibrillation: Secondary | ICD-10-CM | POA: Diagnosis present

## 2024-04-01 DIAGNOSIS — Z7982 Long term (current) use of aspirin: Secondary | ICD-10-CM

## 2024-04-01 DIAGNOSIS — Z955 Presence of coronary angioplasty implant and graft: Secondary | ICD-10-CM

## 2024-04-01 DIAGNOSIS — Z85828 Personal history of other malignant neoplasm of skin: Secondary | ICD-10-CM

## 2024-04-01 DIAGNOSIS — Z888 Allergy status to other drugs, medicaments and biological substances status: Secondary | ICD-10-CM

## 2024-04-01 DIAGNOSIS — I272 Pulmonary hypertension, unspecified: Secondary | ICD-10-CM | POA: Diagnosis present

## 2024-04-01 DIAGNOSIS — Z8616 Personal history of COVID-19: Secondary | ICD-10-CM | POA: Diagnosis not present

## 2024-04-01 DIAGNOSIS — Z951 Presence of aortocoronary bypass graft: Secondary | ICD-10-CM

## 2024-04-01 DIAGNOSIS — Z91040 Latex allergy status: Secondary | ICD-10-CM

## 2024-04-01 DIAGNOSIS — Z9981 Dependence on supplemental oxygen: Secondary | ICD-10-CM

## 2024-04-01 DIAGNOSIS — M4810 Ankylosing hyperostosis [Forestier], site unspecified: Secondary | ICD-10-CM | POA: Diagnosis present

## 2024-04-01 DIAGNOSIS — Z7401 Bed confinement status: Secondary | ICD-10-CM

## 2024-04-01 DIAGNOSIS — Z885 Allergy status to narcotic agent status: Secondary | ICD-10-CM

## 2024-04-01 DIAGNOSIS — Z87442 Personal history of urinary calculi: Secondary | ICD-10-CM

## 2024-04-01 DIAGNOSIS — K219 Gastro-esophageal reflux disease without esophagitis: Secondary | ICD-10-CM | POA: Diagnosis present

## 2024-04-01 DIAGNOSIS — Z8674 Personal history of sudden cardiac arrest: Secondary | ICD-10-CM

## 2024-04-01 LAB — COMPREHENSIVE METABOLIC PANEL WITH GFR
ALT: <5 U/L (ref 0–44)
AST: 19 U/L (ref 15–41)
Albumin: 3.3 g/dL — ABNORMAL LOW (ref 3.5–5.0)
Alkaline Phosphatase: 194 U/L — ABNORMAL HIGH (ref 38–126)
Anion gap: 8 (ref 5–15)
BUN: 31 mg/dL — ABNORMAL HIGH (ref 8–23)
CO2: 30 mmol/L (ref 22–32)
Calcium: 8.8 mg/dL — ABNORMAL LOW (ref 8.9–10.3)
Chloride: 99 mmol/L (ref 98–111)
Creatinine, Ser: 1.36 mg/dL — ABNORMAL HIGH (ref 0.61–1.24)
GFR, Estimated: 52 mL/min — ABNORMAL LOW
Glucose, Bld: 188 mg/dL — ABNORMAL HIGH (ref 70–99)
Potassium: 5 mmol/L (ref 3.5–5.1)
Sodium: 137 mmol/L (ref 135–145)
Total Bilirubin: 0.2 mg/dL (ref 0.0–1.2)
Total Protein: 7 g/dL (ref 6.5–8.1)

## 2024-04-01 LAB — BLOOD GAS, VENOUS
Acid-Base Excess: 1.9 mmol/L (ref 0.0–2.0)
Acid-Base Excess: 5.2 mmol/L — ABNORMAL HIGH (ref 0.0–2.0)
Bicarbonate: 31.8 mmol/L — ABNORMAL HIGH (ref 20.0–28.0)
Bicarbonate: 32.6 mmol/L — ABNORMAL HIGH (ref 20.0–28.0)
Delivery systems: POSITIVE
FIO2: 50 %
O2 Saturation: 85.5 %
O2 Saturation: 86 %
Patient temperature: 37
Patient temperature: 37
pCO2, Ven: 76 mmHg (ref 44–60)
pCO2, Ven: 59 mmHg (ref 44–60)
pH, Ven: 7.23 — ABNORMAL LOW (ref 7.25–7.43)
pH, Ven: 7.35 (ref 7.25–7.43)
pO2, Ven: 51 mmHg — ABNORMAL HIGH (ref 32–45)
pO2, Ven: 48 mmHg — ABNORMAL HIGH (ref 32–45)

## 2024-04-01 LAB — RESP PANEL BY RT-PCR (RSV, FLU A&B, COVID)  RVPGX2
Influenza A by PCR: NEGATIVE
Influenza B by PCR: NEGATIVE
Resp Syncytial Virus by PCR: NEGATIVE
SARS Coronavirus 2 by RT PCR: NEGATIVE

## 2024-04-01 LAB — CBC
HCT: 31.5 % — ABNORMAL LOW (ref 39.0–52.0)
Hemoglobin: 9.6 g/dL — ABNORMAL LOW (ref 13.0–17.0)
MCH: 29.4 pg (ref 26.0–34.0)
MCHC: 30.5 g/dL (ref 30.0–36.0)
MCV: 96.3 fL (ref 80.0–100.0)
Platelets: 375 K/uL (ref 150–400)
RBC: 3.27 MIL/uL — ABNORMAL LOW (ref 4.22–5.81)
RDW: 15.7 % — ABNORMAL HIGH (ref 11.5–15.5)
WBC: 7.1 K/uL (ref 4.0–10.5)
nRBC: 0 % (ref 0.0–0.2)

## 2024-04-01 LAB — TROPONIN T, HIGH SENSITIVITY
Troponin T High Sensitivity: 41 ng/L — ABNORMAL HIGH (ref 0–19)
Troponin T High Sensitivity: 44 ng/L — ABNORMAL HIGH (ref 0–19)

## 2024-04-01 LAB — CBG MONITORING, ED: Glucose-Capillary: 165 mg/dL — ABNORMAL HIGH (ref 70–99)

## 2024-04-01 LAB — PRO BRAIN NATRIURETIC PEPTIDE: Pro Brain Natriuretic Peptide: 1318 pg/mL — ABNORMAL HIGH

## 2024-04-01 MED ORDER — PRAVASTATIN SODIUM 40 MG PO TABS
40.0000 mg | ORAL_TABLET | Freq: Every day | ORAL | Status: DC
  Administered 2024-04-02 – 2024-04-09 (×8): 40 mg via ORAL
  Filled 2024-04-01 (×7): qty 1
  Filled 2024-04-01: qty 2

## 2024-04-01 MED ORDER — TRAZODONE HCL 50 MG PO TABS
25.0000 mg | ORAL_TABLET | Freq: Every evening | ORAL | Status: DC | PRN
  Administered 2024-04-03 – 2024-04-05 (×2): 25 mg via ORAL
  Filled 2024-04-01 (×2): qty 1

## 2024-04-01 MED ORDER — MAGNESIUM OXIDE 400 MG PO TABS
400.0000 mg | ORAL_TABLET | Freq: Every day | ORAL | Status: DC
  Administered 2024-04-02 – 2024-04-09 (×8): 400 mg via ORAL
  Filled 2024-04-01 (×15): qty 1

## 2024-04-01 MED ORDER — ACETAMINOPHEN 650 MG RE SUPP: 650.0000 mg | Freq: Four times a day (QID) | RECTAL | Status: DC | PRN

## 2024-04-01 MED ORDER — BENAZEPRIL HCL 10 MG PO TABS
10.0000 mg | ORAL_TABLET | Freq: Every day | ORAL | Status: DC
  Administered 2024-04-02 – 2024-04-09 (×8): 10 mg via ORAL
  Filled 2024-04-01 (×9): qty 1

## 2024-04-01 MED ORDER — INSULIN ASPART 100 UNIT/ML IJ SOLN
0.0000 [IU] | Freq: Three times a day (TID) | INTRAMUSCULAR | Status: DC
  Administered 2024-04-02: 3 [IU] via SUBCUTANEOUS
  Administered 2024-04-02: 8 [IU] via SUBCUTANEOUS
  Administered 2024-04-02: 2 [IU] via SUBCUTANEOUS
  Administered 2024-04-03 (×2): 3 [IU] via SUBCUTANEOUS
  Administered 2024-04-03: 8 [IU] via SUBCUTANEOUS
  Administered 2024-04-04 (×2): 3 [IU] via SUBCUTANEOUS
  Administered 2024-04-04: 5 [IU] via SUBCUTANEOUS
  Administered 2024-04-05: 3 [IU] via SUBCUTANEOUS
  Administered 2024-04-05: 5 [IU] via SUBCUTANEOUS
  Administered 2024-04-05: 3 [IU] via SUBCUTANEOUS
  Administered 2024-04-06: 5 [IU] via SUBCUTANEOUS
  Administered 2024-04-06: 2 [IU] via SUBCUTANEOUS
  Administered 2024-04-06: 8 [IU] via SUBCUTANEOUS
  Administered 2024-04-07 (×2): 3 [IU] via SUBCUTANEOUS
  Administered 2024-04-08: 15 [IU] via SUBCUTANEOUS
  Administered 2024-04-08: 5 [IU] via SUBCUTANEOUS
  Administered 2024-04-08 – 2024-04-09 (×2): 2 [IU] via SUBCUTANEOUS
  Administered 2024-04-09: 8 [IU] via SUBCUTANEOUS
  Filled 2024-04-01: qty 15
  Filled 2024-04-01 (×2): qty 3
  Filled 2024-04-01: qty 2
  Filled 2024-04-01 (×2): qty 3
  Filled 2024-04-01: qty 5
  Filled 2024-04-01: qty 3
  Filled 2024-04-01: qty 8
  Filled 2024-04-01: qty 3
  Filled 2024-04-01: qty 5
  Filled 2024-04-01: qty 8
  Filled 2024-04-01: qty 3
  Filled 2024-04-01: qty 2
  Filled 2024-04-01: qty 3
  Filled 2024-04-01: qty 5
  Filled 2024-04-01: qty 8
  Filled 2024-04-01: qty 3
  Filled 2024-04-01: qty 5
  Filled 2024-04-01: qty 3
  Filled 2024-04-01: qty 2

## 2024-04-01 MED ORDER — TAMSULOSIN HCL 0.4 MG PO CAPS
0.4000 mg | ORAL_CAPSULE | Freq: Every day | ORAL | Status: DC
  Administered 2024-04-01 – 2024-04-09 (×9): 0.4 mg via ORAL
  Filled 2024-04-01 (×9): qty 1

## 2024-04-01 MED ORDER — DICLOFENAC SODIUM 1 % EX GEL: 2.0000 g | Freq: Four times a day (QID) | CUTANEOUS | Status: DC

## 2024-04-01 MED ORDER — ONDANSETRON HCL 4 MG/2ML IJ SOLN: 4.0000 mg | Freq: Four times a day (QID) | INTRAMUSCULAR | Status: DC | PRN

## 2024-04-01 MED ORDER — HYDROCODONE-ACETAMINOPHEN 5-325 MG PO TABS: 1.0000 | ORAL_TABLET | Freq: Four times a day (QID) | ORAL | Status: DC | PRN

## 2024-04-01 MED ORDER — ALPRAZOLAM 0.25 MG PO TABS
0.2500 mg | ORAL_TABLET | Freq: Two times a day (BID) | ORAL | Status: DC | PRN
  Administered 2024-04-04: 0.25 mg via ORAL
  Filled 2024-04-01: qty 1

## 2024-04-01 MED ORDER — ESCITALOPRAM OXALATE 10 MG PO TABS
20.0000 mg | ORAL_TABLET | Freq: Every day | ORAL | Status: DC
  Administered 2024-04-02 – 2024-04-09 (×8): 20 mg via ORAL
  Filled 2024-04-01 (×8): qty 2

## 2024-04-01 MED ORDER — POLYETHYLENE GLYCOL 3350 17 G PO PACK: 17.0000 g | PACK | Freq: Every day | ORAL | Status: DC | PRN

## 2024-04-01 MED ORDER — INSULIN GLARGINE 100 UNIT/ML ~~LOC~~ SOLN
15.0000 [IU] | Freq: Every day | SUBCUTANEOUS | Status: DC
  Administered 2024-04-02 – 2024-04-03 (×2): 15 [IU] via SUBCUTANEOUS
  Filled 2024-04-01 (×3): qty 0.15

## 2024-04-01 MED ORDER — ONDANSETRON HCL 4 MG PO TABS: 4.0000 mg | ORAL_TABLET | Freq: Four times a day (QID) | ORAL | Status: DC | PRN

## 2024-04-01 MED ORDER — BRIMONIDINE TARTRATE 0.2 % OP SOLN: 1.0000 [drp] | Freq: Three times a day (TID) | OPHTHALMIC | Status: DC

## 2024-04-01 MED ORDER — BENZONATATE 100 MG PO CAPS
100.0000 mg | ORAL_CAPSULE | Freq: Three times a day (TID) | ORAL | Status: DC | PRN
  Administered 2024-04-04: 100 mg via ORAL
  Filled 2024-04-01: qty 1

## 2024-04-01 MED ORDER — GUAIFENESIN ER 600 MG PO TB12
600.0000 mg | ORAL_TABLET | Freq: Two times a day (BID) | ORAL | Status: DC
  Administered 2024-04-01 – 2024-04-09 (×16): 600 mg via ORAL
  Filled 2024-04-01 (×16): qty 1

## 2024-04-01 MED ORDER — FLUTICASONE PROPIONATE 50 MCG/ACT NA SUSP: 2.0000 | Freq: Every day | NASAL | Status: DC

## 2024-04-01 MED ORDER — FINASTERIDE 5 MG PO TABS
5.0000 mg | ORAL_TABLET | Freq: Every day | ORAL | Status: DC
  Administered 2024-04-02 – 2024-04-09 (×8): 5 mg via ORAL
  Filled 2024-04-01 (×8): qty 1

## 2024-04-01 MED ORDER — BUPROPION HCL ER (XL) 150 MG PO TB24
300.0000 mg | ORAL_TABLET | Freq: Every day | ORAL | Status: DC
  Administered 2024-04-02 – 2024-04-09 (×8): 300 mg via ORAL
  Filled 2024-04-01 (×8): qty 2

## 2024-04-01 MED ORDER — SODIUM CHLORIDE 0.9 % IV SOLN
3.0000 g | Freq: Four times a day (QID) | INTRAVENOUS | Status: DC
  Administered 2024-04-01 – 2024-04-05 (×15): 3 g via INTRAVENOUS
  Filled 2024-04-01 (×17): qty 8

## 2024-04-01 MED ORDER — PANTOPRAZOLE SODIUM 40 MG PO TBEC
40.0000 mg | DELAYED_RELEASE_TABLET | Freq: Every day | ORAL | Status: DC
  Administered 2024-04-02 – 2024-04-09 (×8): 40 mg via ORAL
  Filled 2024-04-01 (×8): qty 1

## 2024-04-01 MED ORDER — INSULIN ASPART 100 UNIT/ML IJ SOLN
0.0000 [IU] | Freq: Every day | INTRAMUSCULAR | Status: DC
  Administered 2024-04-02: 3 [IU] via SUBCUTANEOUS
  Administered 2024-04-03: 2 [IU] via SUBCUTANEOUS
  Filled 2024-04-01: qty 2
  Filled 2024-04-01: qty 3

## 2024-04-01 MED ORDER — BASAGLAR KWIKPEN 100 UNIT/ML ~~LOC~~ SOPN: 15.0000 [IU] | PEN_INJECTOR | Freq: Every day | SUBCUTANEOUS | Status: DC

## 2024-04-01 MED ORDER — GABAPENTIN 100 MG PO CAPS
200.0000 mg | ORAL_CAPSULE | Freq: Two times a day (BID) | ORAL | Status: DC
  Administered 2024-04-01 – 2024-04-09 (×14): 200 mg via ORAL
  Filled 2024-04-01 (×15): qty 2

## 2024-04-01 MED ORDER — APIXABAN 5 MG PO TABS
5.0000 mg | ORAL_TABLET | Freq: Two times a day (BID) | ORAL | Status: DC
  Administered 2024-04-01 – 2024-04-09 (×16): 5 mg via ORAL
  Filled 2024-04-01 (×16): qty 1

## 2024-04-01 MED ORDER — CARVEDILOL 3.125 MG PO TABS
3.1250 mg | ORAL_TABLET | Freq: Two times a day (BID) | ORAL | Status: DC
  Administered 2024-04-01 – 2024-04-09 (×15): 3.125 mg via ORAL
  Filled 2024-04-01 (×15): qty 1

## 2024-04-01 MED ORDER — FUROSEMIDE 20 MG PO TABS
20.0000 mg | ORAL_TABLET | Freq: Every day | ORAL | Status: DC
  Administered 2024-04-02 – 2024-04-09 (×8): 20 mg via ORAL
  Filled 2024-04-01 (×8): qty 1

## 2024-04-01 MED ORDER — ACETAMINOPHEN 325 MG PO TABS
650.0000 mg | ORAL_TABLET | Freq: Four times a day (QID) | ORAL | Status: DC | PRN
  Filled 2024-04-01 (×2): qty 2

## 2024-04-01 MED ORDER — ASPIRIN 81 MG PO TBEC
81.0000 mg | DELAYED_RELEASE_TABLET | Freq: Every day | ORAL | Status: DC
  Administered 2024-04-02 – 2024-04-09 (×8): 81 mg via ORAL
  Filled 2024-04-01 (×8): qty 1

## 2024-04-01 MED ORDER — SPIRONOLACTONE 12.5 MG HALF TABLET
12.5000 mg | ORAL_TABLET | Freq: Every day | ORAL | Status: DC
  Administered 2024-04-02 – 2024-04-09 (×8): 12.5 mg via ORAL
  Filled 2024-04-01 (×8): qty 1

## 2024-04-01 MED ORDER — IPRATROPIUM BROMIDE 0.02 % IN SOLN
0.5000 mg | Freq: Four times a day (QID) | RESPIRATORY_TRACT | Status: DC
  Administered 2024-04-01 – 2024-04-02 (×5): 0.5 mg via RESPIRATORY_TRACT
  Filled 2024-04-01 (×5): qty 2.5

## 2024-04-01 MED ORDER — LEVALBUTEROL HCL 0.63 MG/3ML IN NEBU
0.6300 mg | INHALATION_SOLUTION | Freq: Four times a day (QID) | RESPIRATORY_TRACT | Status: DC | PRN
  Administered 2024-04-05 – 2024-04-06 (×3): 0.63 mg via RESPIRATORY_TRACT
  Filled 2024-04-01 (×6): qty 3

## 2024-04-01 NOTE — ED Notes (Signed)
 CCMD called for monitoring

## 2024-04-01 NOTE — ED Notes (Signed)
 Pt was cleaned up at time. Stage two wound present upon assessment from home. Mepalex and barrier cream placed on pt's sacrum. Pt turned on to his left side.

## 2024-04-01 NOTE — ED Notes (Signed)
 Pt soiled with feces. This RN and Darryl, NT clean pt at this time. Pt clean and dried.

## 2024-04-01 NOTE — ED Notes (Signed)
2 sets of blood cultures sent.

## 2024-04-01 NOTE — ED Notes (Signed)
RN called RT for BiPAP.

## 2024-04-01 NOTE — H&P (Signed)
 " History and Physical    Derek Andersen FMW:[id number] DOB: 1941/01/17 DOA: 04/01/2024  PCP: Diedra Lame, MD (Confirm with patient/family/NH records and if not entered, this has to be entered at [facility] point of entry) Patient coming from: Home  I have personally briefly reviewed patient's old medical records in [facility]  Chief Complaint: Choked on corn-dog  HPI: Andersen Derek is a 84 y.o. 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  Patient is in BiPAP and severe respiratory distress only able to provide some history.  Most of history provided by daughter at bedside.  Daughter reported that the patient was recently hospitalized for a pneumonia process and completed antibiotics and discharged home.  At baseline patient need to be fed for his food because chronic bilateral hands weakness secondary to cervical spine problems.  This afternoon, while eating a corn dog patient started to choking which family attributed to  he was fed too fast and family denied any history of aspiration before.  Patient was found to be cyanotic and O2 saturation dropped to below 50 and EMS was called.  EMS arrived and patient was placed on nonrebreather.  However, there was no history of cough or choke after eat or drink.  And patient was never formally evaluated by speech therapist before.    On New Year's Day, patient had some back pain and dysuria went to [facility] ED when he was diagnosed with a kidney infection, while lying down on the MRI machine patient started to have choking which family attributed to  he cannot lie last for his neck problem, lying flat but related to the aspiration event and patient was found to be hypoxic and short course of CPR performed and  patient recovered consciousness.  He was subsequently treated with Zosyn  for 10 days for aspiration pneumonia. ED Course: Afebrile, no tachycardia, positive for tachypneic blood pressure 130/70 O2 saturation 95% on BiPAP.  Blood gas 7.20 2/76/51.  Chest x-ray positive for right upper lobe infiltrates.  Review of Systems: As per HPI otherwise 14 point review of systems negative.    Past Medical History:  Diagnosis Date   Arthritis    Atrial fibrillation (HCC)    a.) CHA2DS2-VASc = 7 (age x 2, HTN, DVT x 2, prior MI, T2DM). b.) s/p TEE with cardioversion (200J x 2) 12/06/2013. c.) chronically anticoagulated using apixaban    BPH (benign prostatic hyperplasia)    CAD (coronary artery disease)    a.) MI 12/31/83 -> LHC: 95% pLAD-1, 100% pLAD-2, 50% mLAD. b.) LHC 05/08/88: 50 pRCA, 95% mLAD. c.) LHC 02/17/02: 25% RCA, 25% LM, 75% OM1; PCI -> 0.25 x 12mm Medtronic stent to OM1. d.) LHC 08/17/02: <25% OM1, 25% RI, 25% LAD. e.) LHC 09/02/03: 100% pLAD, 75% mLAD-1, 50% mLAD-2, <25% pRCA, <25% LM, 25% pLCx, <25% OM2, 25% RI. f.) LHC 11/23/13: 100% LAD; ref to CVTS. g.) MIDCAB (LIMA-LAD) 11/29/13   CKD (chronic kidney disease), stage IV (HCC)    Deep vein thrombosis (DVT) of right lower extremity (HCC) 1985   DISH (diffuse idiopathic skeletal hyperostosis)    Erectile dysfunction    Gait instability    GERD (gastroesophageal reflux disease)    History of 2019 novel coronavirus disease (COVID-19) 11/20/2018   HLD (hyperlipidemia)  Hypertension    IDA (iron deficiency anemia)    Long term current use of anticoagulant    a.) Apixaban    MI (myocardial infarction) (HCC) 12/31/1983   a.) treated in [place], [place]. b.) LHC: 95% pLAD-1, 100% pLAD-2, 50% mLAD; no intervention.   Nephrolithiasis    Neuropathy    OSA on CPAP    Postlaminectomy syndrome of lumbar region    Pulmonary HTN (HCC)    S/P CABG x 1 11/29/2013   a.) single vessel MIDCAB; LIMA-LAD   Skin cancer    a.) hands and back   Spinal stenosis  of lumbar region with neurogenic claudication    Suicidal ideation 11/21/2020   a.) expressed during telehealth visit with [facility] provider; reported to PCP. b.) gun in the home   T2DM (type 2 diabetes mellitus) (HCC)     Past Surgical History:  Procedure Laterality Date   ANKLE SURGERY Right 1989   plate in ankle   BACK SURGERY     11x Degernerative disk   CARDIAC CATHETERIZATION Left 08/17/2002   Procedure: CARDIAC CATHETERIZATION; Location: [facility]; Surgeon: Lamar Hasten, MD   CARDIAC CATHETERIZATION Left 11/23/2013   Procedure: CARDIAC CATHETERIZATION; Location: [facility]; Surgeon: Joshua, MD   CARDIAC CATHETERIZATION Left 12/31/1983   Procedure: CARDIAC CATHETERIZATION; Location: [facility]; Surgeon: Miquel Ellen, MD   CARDIAC CATHETERIZATION Left 05/08/1988   Procedure: CARDIAC CATHETERIZATION; Location: [facility]; Surgeon: Lucian Never, MD   CARDIAC CATHETERIZATION Left 09/02/2003   Procedure: CARDIAC CATHETERIZATION; Location: [facility]; Surgeon: Ozell Estelle, MD   CORONARY ANGIOPLASTY WITH STENT PLACEMENT Left 02/17/2002   Procedure: CORONARY ANGIOPLASTY WITH STENT PLACEMENT (0.25 x 12 mm Medtronic stent to OM1); Location: [facility]   CYSTOSCOPY W/ RETROGRADES  02/09/2021   Procedure: CYSTOSCOPY WITH RETROGRADE PYELOGRAM;  Surgeon: Francisca Redell BROCKS, MD;  Location: [facility] ORS;  Service: Urology;;   CYSTOSCOPY/URETEROSCOPY/HOLMIUM LASER/STENT PLACEMENT Left 02/09/2021   Procedure: CYSTOSCOPY/URETEROSCOPY/HOLMIUM LASER/STENT PLACEMENT;  Surgeon: Francisca Redell BROCKS, MD;  Location: [facility] ORS;  Service: Urology;  Laterality: Left;   MINIMALLY INVASIVE DIRECT CORONARY ARTERY BYPASS (MIDCAB; LIMA -LAD) Left 11/29/2013   Procedure: MINIMALLY INVASIVE DIRECT CORONARY ARTERY BYPASS (MIDCAB; LIMA -LAD); Location: [facility]     reports that he has never smoked. He has never used smokeless tobacco. He reports that he does not currently use alcohol. He reports that he does not currently use drugs.  Allergies[1]  History  reviewed. No pertinent family history.   Prior to Admission medications  Medication Sig Start Date End Date Taking? Authorizing Provider  acetaminophen  (TYLENOL ) 325 MG tablet Take 650 mg by mouth every 6 (six) hours as needed for headache.   Yes [provider]  ALPRAZolam  (XANAX ) 0.25 MG tablet Take 1 tablet (0.25 mg total) by mouth 2 (two) times daily as needed for anxiety. 03/28/24  Yes Wieting, Richard, MD  apixaban  (ELIQUIS ) 5 MG TABS tablet Take 5 mg by mouth 2 (two) times daily. 03/23/24  Yes [provider]  aspirin  81 MG EC tablet Take 81 mg by mouth daily.   Yes [provider]  benazepril  (LOTENSIN ) 10 MG tablet Take 10 mg by mouth daily. 01/19/24  Yes [provider]  brimonidine  (ALPHAGAN ) 0.2 % ophthalmic solution Place 1 drop into both eyes 3 (three) times daily.   Yes [provider]  buPROPion  (WELLBUTRIN  XL) 300 MG 24 hr tablet Take 300 mg by mouth daily. 01/22/24  Yes [provider]  calcium carbonate (TUMS EX) 750 MG chewable tablet Chew 750 mg by mouth in the  morning and at bedtime.   Yes [provider]  carvedilol  (COREG ) 3.125 MG tablet Take 3.125 mg by mouth 2 (two) times daily with a meal. 03/24/24  Yes [provider]  Cholecalciferol  25 MCG (1000 UT) capsule Take 1 tablet by mouth daily. 05/30/09  Yes [provider]  cyanocobalamin (VITAMIN B12) 1000 MCG tablet Take 1,000 mcg by mouth daily.   Yes [provider]  diclofenac  Sodium (VOLTAREN ) 1 % GEL Apply 2 g topically in the morning and at bedtime.   Yes [provider]  escitalopram  (LEXAPRO ) 20 MG tablet Take 1 tablet (20 mg total) by mouth daily. 03/28/24  Yes Wieting, Richard, MD  ferrous sulfate  325 (65 FE) MG tablet Take 325 mg by mouth daily.   Yes [provider]  finasteride  (PROSCAR ) 5 MG tablet Take 5 mg by mouth daily. 01/22/24  Yes [provider]  fluticasone  (FLONASE ) 50 MCG/ACT nasal  spray Place 2 sprays into both nostrils daily.   Yes [provider]  furosemide  (LASIX ) 20 MG tablet Take 20 mg by mouth daily. 01/05/24  Yes [provider]  gabapentin  (NEURONTIN ) 300 MG capsule One tab po qam and 2 tabs po in evening 03/28/24  Yes Wieting, Richard, MD  HYDROcodone -acetaminophen  (NORCO/VICODIN) 5-325 MG tablet Take 1 tablet by mouth every 6 (six) hours as needed for severe pain (pain score 7-10). 03/28/24  Yes Wieting, Richard, MD  hydrocortisone 2.5 % cream Apply 1 Application topically 2 (two) times daily as needed (facial rash).   Yes [provider]  insulin  aspart (NOVOLOG ) 100 UNIT/ML injection Inject 0-50 Units into the skin 3 (three) times daily before meals.   Yes [provider]  Insulin  Glargine (BASAGLAR  KWIKPEN) 100 UNIT/ML Inject 30 Units into the skin daily.   Yes [provider]  magnesium  oxide (MAG-OX) 400 MG tablet Take 400 mg by mouth daily.   Yes [provider]  Multiple Vitamin (MULTIVITAMIN WITH MINERALS) TABS tablet Take 1 tablet by mouth daily.   Yes [provider]  NOVOLOG  FLEXPEN 100 UNIT/ML FlexPen PLEASE SEE ATTACHED FOR DETAILED DIRECTIONS 03/23/24  Yes [provider]  Omega-3 Fatty Acids (FISH OIL) 1200 MG CAPS Take 2,400 mg by mouth in the morning and at bedtime.   Yes [provider]  pantoprazole  (PROTONIX ) 40 MG tablet Take 40 mg by mouth daily.   Yes [provider]  polyethylene glycol (MIRALAX  / GLYCOLAX ) 17 g packet Take 17 g by mouth daily as needed. 03/28/24  Yes Wieting, Richard, MD  pravastatin  (PRAVACHOL ) 40 MG tablet Take 40 mg by mouth daily.   Yes [provider]  spironolactone  (ALDACTONE ) 25 MG tablet Take 0.5 tablets (12.5 mg total) by mouth daily. 03/28/24  Yes Wieting, Richard, MD  tamsulosin  (FLOMAX ) 0.4 MG CAPS capsule Take 0.4 mg by mouth daily. 01/22/24  Yes [provider]  empagliflozin (JARDIANCE) 25 MG TABS tablet  Take 25 mg by mouth daily. Patient not taking: Reported on 03/25/2024    [provider]    Physical Exam: Vitals:   04/01/24 1412 04/01/24 1414 04/01/24 1430 04/01/24 1700  BP:  118/62 (!) 105/52 137/70  Pulse:  84 80 71  Resp:  (!) 30 (!) 28 (!) 22  Temp:  (!) 97.1 F (36.2 C)    TempSrc:  Axillary    SpO2:  95% 96% 100%  Weight: 98.7 kg     Height: 5' 6 (1.676 m)       Constitutional:  NAD, calm, comfortable Vitals:   04/01/24 1412 04/01/24 1414 04/01/24 1430 04/01/24 1700  BP:  118/62 (!) 105/52 137/70  Pulse:  84 80 71  Resp:  (!) 30 (!) 28 (!) 22  Temp:  (!) 97.1 F (36.2 C)    TempSrc:  Axillary    SpO2:  95% 96% 100%  Weight: 98.7 kg     Height: 5' 6 (1.676 m)      Eyes: PERRL, lids and conjunctivae normal ENMT: Mucous membranes are moist. Posterior pharynx clear of any exudate or lesions.Normal dentition.  Neck: normal, supple, no masses, no thyromegaly Respiratory: clear to auscultation bilaterally, no wheezing, bilateral crackles.  Increasing respiratory effort. No accessory muscle use.  Cardiovascular: Regular rate and rhythm, no murmurs / rubs / gallops. No extremity edema. 2+ pedal pulses. No carotid bruits.  Abdomen: no tenderness, no masses palpated. No hepatosplenomegaly. Bowel sounds positive.  Musculoskeletal: no clubbing / cyanosis. No joint deformity upper and lower extremities. Good ROM, no contractures. Normal muscle tone.  Skin: no rashes, lesions, ulcers. No induration Neurologic: CN 2-12 grossly intact. Sensation intact, DTR normal. Strength 5/5 in all 4.  Psychiatric: Normal judgment and insight. Alert and oriented x 3. Normal mood.     Labs on Admission: I have personally reviewed following labs and imaging studies  CBC: Recent Labs  Lab 03/26/24 0823 04/01/24 1420  WBC 10.1 7.1  HGB 9.7* 9.6*  HCT 30.7* 31.5*  MCV 92.2 96.3  PLT 437* 375   Basic Metabolic Panel: Recent Labs  Lab 03/26/24 0823 03/27/24 0612  03/28/24 0540 04/01/24 1420  NA 136 130* 136 137  K 4.3 4.5 4.3 5.0  CL 96* 93* 98 99  CO2 31 29 30 30   GLUCOSE 154* 203* 138* 188*  BUN 25* 38* 42* 31*  CREATININE 1.32* 1.49* 1.32* 1.36*  CALCIUM 9.6 9.0 9.0 8.8*  MG 1.8  --   --   --    GFR: Estimated Creatinine Clearance: 46.9 mL/min (A) (by C-G formula based on SCr of 1.36 mg/dL (H)). Liver Function Tests: Recent Labs  Lab 04/01/24 1420  AST 19  ALT <5  ALKPHOS 194*  BILITOT 0.2  PROT 7.0  ALBUMIN 3.3*   No results for input(s): LIPASE, AMYLASE in the last 168 hours. No results for input(s): AMMONIA in the last 168 hours. Coagulation Profile: No results for input(s): INR, PROTIME in the last 168 hours. Cardiac Enzymes: No results for input(s): CKTOTAL, CKMB, CKMBINDEX, TROPONINI in the last 168 hours. BNP (last 3 results) Recent Labs    03/25/24 0050 04/01/24 1420  PROBNP 5,760.0* 1,318.0*   HbA1C: No results for input(s): HGBA1C in the last 72 hours. CBG: Recent Labs  Lab 03/27/24 0749 03/27/24 1151 03/27/24 1715 03/27/24 2040 03/28/24 0907  GLUCAP 170* 270* 259* 301* 176*   Lipid Profile: No results for input(s): CHOL, HDL, LDLCALC, TRIG, CHOLHDL, LDLDIRECT in the last 72 hours. Thyroid Function Tests: No results for input(s): TSH, T4TOTAL, FREET4, T3FREE, THYROIDAB in the last 72 hours. Anemia Panel: No results for input(s): VITAMINB12, FOLATE, FERRITIN, TIBC, IRON, RETICCTPCT in the last 72 hours. Urine analysis:    Component Value Date/Time   COLORURINE YELLOW (A) 01/17/2023 1405   APPEARANCEUR CLEAR (A) 01/17/2023 1405   APPEARANCEUR Clear 02/22/2021 1012   LABSPEC 1.021 01/17/2023 1405   PHURINE 6.0 01/17/2023 1405   GLUCOSEU >=500 (A) 01/17/2023 1405   HGBUR SMALL (A) 01/17/2023 1405   BILIRUBINUR NEGATIVE 01/17/2023 1405   BILIRUBINUR Negative 02/22/2021  1012   KETONESUR 5 (A) 01/17/2023 1405   PROTEINUR 100 (A) 01/17/2023 1405    NITRITE NEGATIVE 01/17/2023 1405   LEUKOCYTESUR NEGATIVE 01/17/2023 1405    Radiological Exams on Admission: DG Chest Portable 1 View Result Date: 04/01/2024 CLINICAL DATA:  Shortness of breath. EXAM: PORTABLE CHEST 1 VIEW COMPARISON:  03/24/2024, 06/26/2023 FINDINGS: Lungs are hypoinflated with persistent left base/retrocardiac opacification possible slight interval improvement as these findings were also present April 2025. Persistent airspace opacification over the right upper lobe/apex new compared April 2025. Cardiomediastinal silhouette and remainder of the exam is unchanged. IMPRESSION: Right upper lobe/apical airspace process which is stable in may represent focal infection. Chronic left base opacification. Electronically Signed   By: Toribio Agreste M.D.   On: 04/01/2024 14:49    EKG: Independently reviewed.  Sinus rhythm, chronic LBBB  Assessment/Plan Principal Problem:   PNA (pneumonia) Active Problems:   Aspiration pneumonia (HCC)  (please populate well all problems here in Problem List. (For example, if patient is on BP meds at home and you resume or decide to hold them, it is a problem that needs to be her. Same for CAD, COPD, HLD and so on)  Acute hypoxic and hypercapnic respiratory failure Aspiration pneumonia, recurrent - Continue BiPAP support admit to PCU to wean off BiPAP.  Repeat VBG in 2 hours - Patient has a very weak cough, family reported that since discharge from hospital last week patient has been having  gurgling cough but no sputum brought up.  Will start patient on breathing treatment, incentive spirometry and flutter valve. - Guaifenesin  and Tessalon . - Speech evaluation - Clinically patient has high risk of developing aspiration pneumonia, we will start him on Unasyn . CT to rule out any pneumonia complications such as empyema.  Chronic HFrEF - Euvolemic - Resume Lasix  tomorrow  PAF - In sinus rhythm, continue Eliquis  and Coreg   IDDM - Continue Lantus   and sliding scale  Chronic back pain - Continue gabapentin   Anxiety/depression - Continue Lexapro  and Wellbutrin   DVT prophylaxis: Eliquis  Code Status: DNR Family Communication: Daughter at bedside Disposition Plan: Patient is sick with hypoxia and hypercapnia requiring BiPAP expect more than 2 midnight hospital stay. Consults called: None Admission status: PCU admit   Cort ONEIDA Mana MD [facility] Pager [phone]  04/01/2024, 5:56 PM       [1]  Allergies Allergen Reactions   Oxycodone      Patient states you don't know where you are at, and it screws you up. It also gives crazy dreams.   Prednisone Other (See Comments)    Memory loss   Latex    "

## 2024-04-01 NOTE — ED Provider Notes (Signed)
 "  [facility] Provider Note    Event Date/Time   First MD Initiated Contact with Patient 04/01/24 1422     (approximate)   History   Respiratory Distress   Pt via [facility] from home. Pt here for respiratory distress and AMS. Per caregiver, pt was being fed and possibly aspirated. Pt chronically wear 3L [place] at baseline and normally sat in the 90%. After the pt ate, he became more lethargic. EMS reports that initial O2 sat was 74% on baseline 3L. Pt was placed on NRB and pt increased to 94%. EMS gave 1 Duoneb and 1 Albuterol  tx. On arrival, pt is on a NRB sat 94%. Pt is very lethargic and minimally responsive. Dr. Clarine at bedside at this time.    HPI Derek Andersen is a 84 y.o. male PMH chronic hypoxic respiratory failure on 3L nasal cannula, atrial fibrillation and prior DVT on Eliquis , CAD, CHF, prior CABG, CKD, recent cardiac arrest, hypertension, hyperlipidemia brought in by ambulance for shortness of breath, altered mental status -Per EMS, patient had a choking episode while eating, desatted to 74% on his usual 3 L nasal cannula ( usually around 90s percent), improved to mid 90s on nonrebreather.  Increasingly lethargic with EMS.  Received 1 round DuoNeb and 1 round of albuterol .  Vital signs otherwise stable.  Hypoxia and tachypnea. -On my evaluation, patient does arouse to tell me his name though is sleepy. -Family called shortly after patient arrival and confirmed full CODE STATUS  Per chart review, recently admitted 03/25/2024-03/28/2024.  Noted to have been recently discharged from [facility] the day before, had gone in for back pain, was laid flat for an MRI to evaluate this and went into cardiac arrest, thought to have been a likely hypoxic arrest from lying flat.  Was treated for pneumonia in addition and found to have rhinovirus.  Discharged with 3 L O2, chest to go home instead of to SNF rehab.  Presented for ongoing shortness of breath,'s saturations at 71% despite  6 L nasal cannula, came to ED.  Labs concerning for CHF and also had recent multifocal pneumonia and rhinovirus infection while at [facility].     Physical Exam   Triage Vital Signs: ED Triage Vitals  Encounter Vitals Group     BP 04/01/24 1414 118/62     Girls Systolic BP Percentile --      Girls Diastolic BP Percentile --      Boys Systolic BP Percentile --      Boys Diastolic BP Percentile --      Pulse Rate 04/01/24 1414 84     Resp 04/01/24 1414 (!) 30     Temp 04/01/24 1414 (!) 97.1 F (36.2 C)     Temp Source 04/01/24 1414 Axillary     SpO2 04/01/24 1414 95 %     Weight 04/01/24 1412 217 lb 9.5 oz (98.7 kg)     Height 04/01/24 1412 5' 6 (1.676 m)     Head Circumference --      Peak Flow --      Pain Score --      Pain Loc --      Pain Education --      Exclude from Growth Chart --     Most recent vital signs: Vitals:   04/01/24 1414 04/01/24 1430  BP: 118/62 (!) 105/52  Pulse: 84 80  Resp: (!) 30 (!) 28  Temp: (!) 97.1 F (36.2 C)   SpO2: 95%  96%     General: Sleepy but easily arousable, able to tell me his name CV:  Good peripheral perfusion. RRR, RP 2+ Resp:  Notably tachypneic, good airflow bilaterally with no obvious wheezing appreciated, no clear focal coarse breath sounds though exam somewhat limited by habitus Abd:  No distention. Nontender to deep palpation throughout    ED Results / Procedures / Treatments   Labs (all labs ordered are listed, but only abnormal results are displayed) Labs Reviewed  CBC - Abnormal; Notable for the following components:      Result Value   RBC 3.27 (*)    Hemoglobin 9.6 (*)    HCT 31.5 (*)    RDW 15.7 (*)    All other components within normal limits  COMPREHENSIVE METABOLIC PANEL WITH GFR - Abnormal; Notable for the following components:   Glucose, Bld 188 (*)    BUN 31 (*)    Creatinine, Ser 1.36 (*)    Calcium 8.8 (*)    Albumin 3.3 (*)    Alkaline Phosphatase 194 (*)    GFR, Estimated 52 (*)    All other  components within normal limits  BLOOD GAS, VENOUS - Abnormal; Notable for the following components:   pH, Ven 7.23 (*)    pCO2, Ven 76 (*)    pO2, Ven 51 (*)    Bicarbonate 31.8 (*)    All other components within normal limits  PRO BRAIN NATRIURETIC PEPTIDE - Abnormal; Notable for the following components:   Pro Brain Natriuretic Peptide 1,318.0 (*)    All other components within normal limits  TROPONIN T, HIGH SENSITIVITY - Abnormal; Notable for the following components:   Troponin T High Sensitivity 41 (*)    All other components within normal limits  RESP PANEL BY RT-PCR (RSV, FLU A&B, COVID)  [id number]  TROPONIN T, HIGH SENSITIVITY     EKG  Ecg = sinus versus accelerated junctional rhythm, rate 85, no gross ST elevation or depression, no significant repolarization abnormality, left axis deviation.  No clear evidence of ischemia on my interpretation.   RADIOLOGY Radiology interpreted by myself and radiology report reviewed.  Some persistent right upper lobe consolidation which is stable from prior.    PROCEDURES:  Critical Care performed: Yes, see critical care procedure note(s)  .Critical Care  Performed by: Clarine Ozell LABOR, MD Authorized by: Clarine Ozell LABOR, MD   Critical care provider statement:    Critical care time (minutes):  30   Critical care time was exclusive of:  Separately billable procedures and treating other patients   Critical care was necessary to treat or prevent imminent or life-threatening deterioration of the following conditions:  Respiratory failure   Critical care was time spent personally by me on the following activities:  Development of treatment plan with patient or surrogate, discussions with consultants, evaluation of patient's response to treatment, examination of patient, ordering and review of laboratory studies, ordering and review of radiographic studies, ordering and performing treatments and interventions, pulse oximetry, re-evaluation of  patient's condition and review of old charts   I assumed direction of critical care for this patient from another provider in my specialty: no     Care discussed with: admitting provider      MEDICATIONS ORDERED IN ED: Medications - No data to display   IMPRESSION / MDM / ASSESSMENT AND PLAN / ED COURSE  I reviewed the triage vital signs and the nursing notes.  DDX/MDM/AP: Differential diagnosis includes, but is not limited to, acute on chronic respiratory failure in setting of likely aspiration event per history, no clear wheezing to suggest obstructive airway process, consider CHF, ACS, underlying pneumonia, viral syndrome including influenza or COVID-19.  Highly doubt PE and is already anticoagulated patient with story much more suggestive of aspiration episode.  Given his sleepiness, I am concerned for likely underlying hypercapnia.  Plan: - Labs - Chest x-ray - EKG - Low threshold for BiPAP  Patient's presentation is most consistent with acute presentation with potential threat to life or bodily function.  The patient is on the cardiac monitor to evaluate for evidence of arrhythmia and/or significant heart rate changes.  ED course below.  Workup notable for significant hypercapnia to the mid 70s with mild acidosis on VBG.  BiPAP initiated with good response.  Chest x-ray stable from prior with no white count, no fever, no complaint of ongoing cough and apparent aspiration episode preceding new hypoxia --deferring antibiotics at this time.  Plan for admission to hospitalist.   Clinical Course as of 04/01/24 1649  Thu Apr 01, 2024  1441 VBG with acidosis and hypercapnia, [MM]  1521 CXR: IMPRESSION: Right upper lobe/apical airspace process which is stable in may represent focal infection. Chronic left base opacification.   [MM]    Clinical Course User Index [MM] Clarine Ozell LABOR, MD     FINAL CLINICAL IMPRESSION(S) / ED DIAGNOSES   Final  diagnoses:  Acute on chronic respiratory failure with hypoxia and hypercapnia (HCC)  Altered mental status, unspecified altered mental status type     Rx / DC Orders   ED Discharge Orders     None        Note:  This document was prepared using Dragon voice recognition software and may include unintentional dictation errors.   Clarine Ozell LABOR, MD 04/01/24 1649  "

## 2024-04-01 NOTE — ED Triage Notes (Signed)
 Pt via [facility] from home. Pt here for respiratory distress and AMS. Per caregiver, pt was being fed and possibly aspirated. Pt chronically wear 3L  at baseline and normally sat in the 90%. After the pt ate, he became more lethargic. EMS reports that initial O2 sat was 74% on baseline 3L. Pt was placed on NRB and pt increased to 94%. EMS gave 1 Duoneb and 1 Albuterol  tx. On arrival, pt is on a NRB sat 94%. Pt is very lethargic and minimally responsive. Dr. Clarine at bedside at this time.

## 2024-04-02 DIAGNOSIS — J690 Pneumonitis due to inhalation of food and vomit: Secondary | ICD-10-CM | POA: Diagnosis not present

## 2024-04-02 DIAGNOSIS — J189 Pneumonia, unspecified organism: Secondary | ICD-10-CM | POA: Diagnosis not present

## 2024-04-02 LAB — BASIC METABOLIC PANEL WITH GFR
Anion gap: 8 (ref 5–15)
BUN: 28 mg/dL — ABNORMAL HIGH (ref 8–23)
CO2: 30 mmol/L (ref 22–32)
Calcium: 8.9 mg/dL (ref 8.9–10.3)
Chloride: 100 mmol/L (ref 98–111)
Creatinine, Ser: 1.21 mg/dL (ref 0.61–1.24)
GFR, Estimated: >60 mL/min
Glucose, Bld: 224 mg/dL — ABNORMAL HIGH (ref 70–99)
Potassium: 4.3 mmol/L (ref 3.5–5.1)
Sodium: 138 mmol/L (ref 135–145)

## 2024-04-02 LAB — CBC
HCT: 28.3 % — ABNORMAL LOW (ref 39.0–52.0)
HCT: 28.1 % — ABNORMAL LOW (ref 39.0–52.0)
Hemoglobin: 8.7 g/dL — ABNORMAL LOW (ref 13.0–17.0)
Hemoglobin: 8.6 g/dL — ABNORMAL LOW (ref 13.0–17.0)
MCH: 29.2 pg (ref 26.0–34.0)
MCH: 29.3 pg (ref 26.0–34.0)
MCHC: 30.7 g/dL (ref 30.0–36.0)
MCHC: 30.6 g/dL (ref 30.0–36.0)
MCV: 95 fL (ref 80.0–100.0)
MCV: 95.6 fL (ref 80.0–100.0)
Platelets: 303 K/uL (ref 150–400)
Platelets: 295 K/uL (ref 150–400)
RBC: 2.98 MIL/uL — ABNORMAL LOW (ref 4.22–5.81)
RBC: 2.94 MIL/uL — ABNORMAL LOW (ref 4.22–5.81)
RDW: 15.7 % — ABNORMAL HIGH (ref 11.5–15.5)
RDW: 15.7 % — ABNORMAL HIGH (ref 11.5–15.5)
WBC: 8.1 K/uL (ref 4.0–10.5)
WBC: 8.5 K/uL (ref 4.0–10.5)
nRBC: 0 % (ref 0.0–0.2)
nRBC: 0 % (ref 0.0–0.2)

## 2024-04-02 LAB — GLUCOSE, CAPILLARY
Glucose-Capillary: 272 mg/dL — ABNORMAL HIGH (ref 70–99)
Glucose-Capillary: 291 mg/dL — ABNORMAL HIGH (ref 70–99)

## 2024-04-02 LAB — CBG MONITORING, ED
Glucose-Capillary: 144 mg/dL — ABNORMAL HIGH (ref 70–99)
Glucose-Capillary: 183 mg/dL — ABNORMAL HIGH (ref 70–99)

## 2024-04-02 MED ORDER — HYDRALAZINE HCL 20 MG/ML IJ SOLN: 10.0000 mg | Freq: Four times a day (QID) | INTRAMUSCULAR | Status: DC | PRN

## 2024-04-02 MED ORDER — GUAIFENESIN-DM 100-10 MG/5ML PO SYRP
5.0000 mL | ORAL_SOLUTION | ORAL | Status: DC | PRN
  Administered 2024-04-02 – 2024-04-04 (×4): 5 mL via ORAL
  Filled 2024-04-02 (×4): qty 10

## 2024-04-02 MED ORDER — FUROSEMIDE 20 MG PO TABS
20.0000 mg | ORAL_TABLET | Freq: Once | ORAL | Status: AC
  Administered 2024-04-02: 20 mg via ORAL
  Filled 2024-04-02: qty 1

## 2024-04-02 MED ORDER — GERHARDT'S BUTT CREAM
TOPICAL_CREAM | Freq: Two times a day (BID) | CUTANEOUS | Status: DC
  Filled 2024-04-02: qty 60

## 2024-04-02 MED ORDER — BRIMONIDINE TARTRATE 0.2 % OP SOLN
1.0000 [drp] | Freq: Three times a day (TID) | OPHTHALMIC | Status: DC
  Administered 2024-04-02 – 2024-04-09 (×20): 1 [drp] via OPHTHALMIC
  Filled 2024-04-02: qty 5

## 2024-04-02 MED ORDER — IPRATROPIUM BROMIDE 0.02 % IN SOLN
0.5000 mg | Freq: Two times a day (BID) | RESPIRATORY_TRACT | Status: DC
  Administered 2024-04-03 – 2024-04-09 (×13): 0.5 mg via RESPIRATORY_TRACT
  Filled 2024-04-02 (×13): qty 2.5

## 2024-04-02 NOTE — Consult Note (Signed)
" °  CLINICAL SUPPORT TEAM - WOUND OSTOMY AND CONTINENCE TEAM  CONSULTATION SERVICES   WOC Nurse-Inpatient Note  WOC Nurse Consult Note: Reason for Consult: buttock wound  Wound type: Stage 2 left buttock  Pressure Injury POA: Yes Measurement: see nursing flow sheets Wound bed:100% pink Drainage (amount, consistency, odor) bleeding Periwound: intact  Dressing procedure/placement/frequency: Cleanse with saline, pat dry. Cover with single layer of xeroform and top with small foam. Change every other day.    Re consult if needed, will not follow at this time. Thanks  Floyde Dingley [employer], RN,CWOCN, CNS, MAINE [phone]       "

## 2024-04-02 NOTE — Progress Notes (Signed)
 Ok per Dr Jerelene to place purewick as patient is bedbound and incontinent with stage 2 wound.

## 2024-04-02 NOTE — TOC Initial Note (Signed)
 Transition of Care [facility]) - Initial/Assessment Note    Patient Details  Name: Derek Andersen MRN: [id number] Date of Birth: March 19, 1940  Transition of Care [facility]) CM/SW Contact:    Lauraine JAYSON Carpen, LCSW Phone Number: 04/02/2024, 3:08 PM  Clinical Narrative:  TOC completed readmission prevention screen on 1/16:   CM met with patient at the bedside, his in home caregiver, Andrea, is at he bedside helping him brush his teeth.  He has another caregiver, Natalie, between [place] and Natalie patient has 12 hours of care per day starting at 7 am until around noon and then from 6 to 10.  He has all needed DME at home.  They do believe that he could benefit from an electric wheelchair, will reach out to [employer] to see if it can be ordered but I believe that they will need to go through his PCP.  He is current with his PCP.   He just discharged from [facility] and then 3 hours later went into distress and needed to be brought back to the ED.  [facility] did set him up with Oxygen and home health.  Home Health is with [facility].  Will reach out [facility].  Patient will need EMS transport home.  He does have a Stage 2 wound to his bottom.                [facility] confirmed patient is still active with RN, PT, OT.   Expected Discharge Plan: Home w Home Health Services Barriers to Discharge: Continued Medical Work up   Patient Goals and CMS Choice            Expected Discharge Plan and Services       Living arrangements for the past 2 months: Single Family Home                           HH Arranged: RN, PT, OT [facility] Agency: [facility] Date [facility] Agency Contacted: 04/02/24   Representative spoke with at [facility] Agency: [place]  Prior Living Arrangements/Services Living arrangements for the past 2 months: Single Family Home   Patient language and need for interpreter reviewed:: Yes        Need for Family Participation in Patient Care: Yes (Comment) Care giver support system in place?:  Yes (comment) Current home services: Homehealth aide, Home OT, Home PT, Home RN, DME Criminal Activity/Legal Involvement Pertinent to Current Situation/Hospitalization: No - Comment as needed  Activities of Daily Living      Permission Sought/Granted                  Emotional Assessment       Orientation: : Oriented to Self, Oriented to Place, Oriented to  Time, Oriented to Situation Alcohol / Substance Use: Not Applicable Psych Involvement: No (comment)  Admission diagnosis:  PNA (pneumonia) [J18.9] Altered mental status, unspecified altered mental status type [R41.82] Acute on chronic respiratory failure with hypoxia and hypercapnia (HCC) [G03.78, J96.22] Patient Active Problem List   Diagnosis Date Noted   PNA (pneumonia) 04/01/2024   Acute deep vein thrombosis (DVT) of right femoral vein (HCC) 03/27/2024   Acute on chronic hypoxic respiratory failure (HCC) 03/26/2024   Pressure injury of skin 03/26/2024   Acute on chronic systolic CHF (congestive heart failure) (HCC) 03/26/2024   Uncontrolled type 2 diabetes mellitus with hyperglycemia, with long-term current use of insulin  (HCC) 03/26/2024   Chronic back pain 03/26/2024   DVT (  deep venous thrombosis) (HCC) 03/26/2024   Rib fractures 03/26/2024   Paroxysmal atrial fibrillation (HCC) 03/26/2024   Depression with anxiety 03/26/2024   Obesity (BMI 30-39.9) 03/26/2024   Aspiration pneumonia (HCC) 03/25/2024   SOB (shortness of breath) 11/20/2022   C. difficile colitis    Symptomatic anemia 06/29/2019   Acute gastroenteritis: C diff and Salmonella positive 06/29/2019    Suspect GI bleed 06/29/2019   AKI (acute kidney injury) 06/29/2019   Chronic anticoagulation 06/29/2019   Atrial fibrillation, chronic (HCC) 06/29/2019   CAD (coronary artery disease) 06/29/2019   Type 2 diabetes mellitus with hyperlipidemia (HCC) 06/29/2019   Clostridium difficile enteritis 06/29/2019   Intestinal infection or food poisoning due  to salmonella bacteria 06/29/2019   Infectious colitis 06/29/2019   PCP:  Diedra Lame, MD Pharmacy:   [facility] [phone] [address], [place] - [address] [place] [address] Phone: [phone] Fax: [phone]     Social Drivers of Health (SDOH) Social History: SDOH Screenings   Food Insecurity: No Food Insecurity (03/25/2024)  Housing: Low Risk (03/25/2024)  Transportation Needs: No Transportation Needs (03/25/2024)  Utilities: Not At Risk (03/25/2024)  Financial Resource Strain: Low Risk  (07/14/2023)   Received from [facility]  Social Connections: Socially Isolated (03/25/2024)  Stress: Stress Concern Present (08/13/2023)   Received from [facility]  Tobacco Use: Low Risk (04/01/2024)   SDOH Interventions:     Readmission Risk Interventions    04/02/2024    3:04 PM  Readmission Risk Prevention Plan  PCP or Specialist Appt within 3-5 Days Complete  HRI or Home Care Consult Complete  Social Work Consult for Recovery Care Planning/Counseling Complete  Palliative Care Screening Not Applicable

## 2024-04-02 NOTE — ED Notes (Signed)
 SLP at bedside. Pt had choking episode yesterday while eating hot dog. Pt swallowing meds with ice cream.

## 2024-04-02 NOTE — ED Notes (Signed)
 This RN called 2A diplomatic services operational officer, Rosina, and informed them pt was on their way.

## 2024-04-02 NOTE — Progress Notes (Addendum)
 " PROGRESS NOTE    Derek Andersen  FMW:[id number] DOB: Feb 18, 1941 DOA: 04/01/2024 PCP: Diedra Lame, MD  Chief Complaint  Patient presents with   Respiratory Distress    Hospital Course:  Derek Andersen is a 84 y.o. 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 Patient was on BiPAP and severe respiratory distress on presentation to ER.  Daughter provided history per chart review  Patient was recently hospitalized for a pneumonia process and completed antibiotics and discharged home.  At baseline patient need to be fed for his food because chronic bilateral hands weakness secondary to cervical spine problems.  while eating a corn dog patient started to choking which family attributed to  he was fed too fast and family denied any history of aspiration before.  Patient was found to be cyanotic and O2 saturation dropped to below 50 and EMS was called.  EMS arrived and patient was placed on nonrebreather.  However, there was no history of cough or choke after eat or drink Was placed on BiPAP on presentation, improved clinically on 3 L [place] which is baseline.  Continuing IV antibiotics, refused to CT. Hospital course as below   On New Year's Day, patient had some back pain and dysuria went to [facility] ED when he was diagnosed with a kidney infection, while lying down on the MRI machine patient started to have choking which family attributed to  he cannot lie last for his neck problem, lying flat but related to the aspiration event and patient was found to be hypoxic and short course of CPR performed and patient recovered consciousness.  He was subsequently treated with Zosyn  for 10 days for aspiration pneumonia.  Subjective: Patient was examined at the bedside,  caregiver Laneta present at the bedside Reports doing significantly better today, at baseline oxygen requirement. Seen by SLP, will continue IV antibiotics Anticipate discharge tomorrow on p.o. antibiotics if stable   Objective: Vitals:   04/02/24 0530 04/02/24 0600 04/02/24 0630 04/02/24 0804  BP: (!) 142/66 135/62 (!) 120/55   Pulse: 63 63 63   Resp: 20 (!) 22 18   Temp:    97.8 F (36.6 C)  TempSrc:    Oral  SpO2: 99% 98% 97%   Weight:      Height:        Intake/Output Summary (Last 24 hours) at 04/02/2024 9167 Last data filed at 04/02/2024 0719 Gross per 24 hour  Intake 100 ml  Output 850 ml  Net -750 ml   Filed Weights   04/01/24 1412  Weight: 98.7 kg    Examination: Constitutional: NAD, calm, comfortable Neck: normal, supple, no masses, no thyromegaly Respiratory: clear to auscultation bilaterally, no wheezing, bilateral crackles. No accessory muscle use.  Cardiovascular: Regular rate and rhythm, no murmurs / rubs / gallops. No extremity edema. 2+ pedal pulses. No carotid bruits  Abdomen: no tenderness, no masses palpated. No hepatosplenomegaly. Bowel sounds positive Musculoskeletal: no clubbing / cyanosis. No joint deformity upper and lower extremities. Good ROM, no contractures. Normal muscle tone Skin: no rashes, lesions, ulcers. No induration Neurologic: CN 2-12 grossly intact. Sensation intact, DTR normal. Strength 5/5 in all 4.  Psychiatric: Normal judgment and insight. Alert and oriented x 3. Normal mood  Assessment & Plan:  Acute on  chronic hypoxic and hypercapnic respiratory failure Aspiration pneumonia, recurrent Was on BiPAP on presentation, weaned down to 3 L [place] which is baseline Suspect patient had episode of choking due to aspiration.  Refused CT due to having episode of unresponsiveness/PEA at [facility], states cannot lay flat COVID/flu/RSV negative CXR shows RUL/apical airspace process stable, may represent focal infection, chronic left base  opacification Continue IV Unasyn  Seen by SLP, aspiration and reflux precautions provided Incentive spirometry, flutter valve   Chronic HFrEF BNP elevated, mild hypervolemia Elevated troponin likely due to demand ischemia  Home Lasix , spironolactone .  Will give extra dose Lasix  20 mg p.o.   PAF continue Eliquis  and Coreg    IDDM HbA1c 7.0 Lantus  15 units (home dose 30 units) and sliding scale  HTN Home Benazepril  10 mg, Coreg  IV hydralazine  with holding parameters  Normocytic anemia Monitor Hb  Chronic back pain Continue gabapentin    Anxiety/depression Continue Lexapro , Wellbutrin , as needed Xanax   Stage II sacral wound (POA) WOC consult  OSA CPAP at night  Obesity class II Body mass index is 35.12 kg/m. Outpatient follow up for lifestyle modification and risk factor management   DVT prophylaxis: Eliquis    Code Status: Limited: Do not attempt resuscitation (DNR) -DNR-LIMITED -Do Not Intubate/DNI  Disposition:  Home  Consultants:  None  Procedures:  None  Antimicrobials:  Anti-infectives (From admission, onward)    Start     Dose/Rate Route Frequency Ordered Stop   04/01/24 1830  Ampicillin -Sulbactam (UNASYN ) 3 g in sodium chloride  0.9 % 100 mL IVPB        3 g 200 mL/hr over 30 Minutes Intravenous Every 6 hours 04/01/24 1800         Data Reviewed: I have personally reviewed following labs and imaging studies CBC: Recent Labs  Lab 04/01/24 1420 04/02/24 0415  WBC 7.1 8.1  HGB 9.6* 8.7*  HCT 31.5* 28.3*  MCV 96.3 95.0  PLT 375 303   Basic Metabolic Panel: Recent Labs  Lab 03/27/24 0612 03/28/24 0540 04/01/24 1420  NA 130* 136 137  K 4.5 4.3 5.0  CL 93* 98 99  CO2 29 30 30   GLUCOSE 203* 138* 188*  BUN 38* 42* 31*  CREATININE 1.49* 1.32* 1.36*  CALCIUM 9.0 9.0 8.8*   GFR: Estimated Creatinine Clearance: 46.9 mL/min (A) (by C-G formula based on SCr of 1.36 mg/dL (H)). Liver Function Tests: Recent Labs  Lab 04/01/24 1420  AST 19   ALT <5  ALKPHOS 194*  BILITOT 0.2  PROT 7.0  ALBUMIN 3.3*   CBG: Recent Labs  Lab 03/27/24 1715 03/27/24 2040 03/28/24 0907 04/01/24 2111 04/02/24 0747  GLUCAP 259* 301* 176* 165* 144*    Recent Results (from the past 240 hours)  Resp panel by RT-PCR (RSV, Flu A&B, Covid) Anterior Nasal Swab     Status: None   Collection Time: 03/25/24  2:56 AM   Specimen: Anterior Nasal Swab  Result Value Ref Range Status   SARS Coronavirus 2 by RT PCR NEGATIVE NEGATIVE Final    Comment: (NOTE) SARS-CoV-2 target nucleic acids are NOT DETECTED.  The SARS-CoV-2 RNA is generally detectable in upper respiratory specimens during the acute phase of infection. The lowest concentration of SARS-CoV-2 viral copies this assay can detect is 138 copies/mL. A negative result does not preclude SARS-Cov-2 infection and should not be used as the sole basis for treatment or other patient management decisions. A negative result may occur with  improper specimen collection/handling, submission of specimen other than nasopharyngeal swab, presence  of viral mutation(s) within the areas targeted by this assay, and inadequate number of viral copies(<138 copies/mL). A negative result must be combined with clinical observations, patient history, and epidemiological information. The expected result is Negative.  Fact Sheet for Patients:  bloggercourse.com  Fact Sheet for Healthcare Providers:  seriousbroker.it  This test is no t yet approved or cleared by the [place]  FDA and  has been authorized for detection and/or diagnosis of SARS-CoV-2 by FDA under an Emergency Use Authorization (EUA). This EUA will remain  in effect (meaning this test can be used) for the duration of the COVID-19 declaration under Section 564(b)(1) of the Act, 21 U.S.C.section 360bbb-3(b)(1), unless the authorization is terminated  or revoked sooner.       Influenza A by PCR  NEGATIVE NEGATIVE Final   Influenza B by PCR NEGATIVE NEGATIVE Final    Comment: (NOTE) The Xpert Xpress SARS-CoV-2/FLU/RSV plus assay is intended as an aid in the diagnosis of influenza from Nasopharyngeal swab specimens and should not be used as a sole basis for treatment. Nasal washings and aspirates are unacceptable for Xpert Xpress SARS-CoV-2/FLU/RSV testing.  Fact Sheet for Patients: bloggercourse.com  Fact Sheet for Healthcare Providers: seriousbroker.it  This test is not yet approved or cleared by the [place]  FDA and has been authorized for detection and/or diagnosis of SARS-CoV-2 by FDA under an Emergency Use Authorization (EUA). This EUA will remain in effect (meaning this test can be used) for the duration of the COVID-19 declaration under Section 564(b)(1) of the Act, 21 U.S.C. section 360bbb-3(b)(1), unless the authorization is terminated or revoked.     Resp Syncytial Virus by PCR NEGATIVE NEGATIVE Final    Comment: (NOTE) Fact Sheet for Patients: bloggercourse.com  Fact Sheet for Healthcare Providers: seriousbroker.it  This test is not yet approved or cleared by the [place]  FDA and has been authorized for detection and/or diagnosis of SARS-CoV-2 by FDA under an Emergency Use Authorization (EUA). This EUA will remain in effect (meaning this test can be used) for the duration of the COVID-19 declaration under Section 564(b)(1) of the Act, 21 U.S.C. section 360bbb-3(b)(1), unless the authorization is terminated or revoked.  Performed at [facility], [address] [place], [place] [address]   Blood culture (routine x 2)     Status: None   Collection Time: 03/25/24  2:56 AM   Specimen: BLOOD RIGHT HAND  Result Value Ref Range Status   Specimen Description BLOOD RIGHT HAND  Final   Special Requests   Final    BOTTLES DRAWN AEROBIC AND  ANAEROBIC Blood Culture results may not be optimal due to an inadequate volume of blood received in culture bottles   Culture   Final    NO GROWTH 5 DAYS Performed at [facility], [address] [place], [place] [address]    Report Status 03/30/2024 FINAL  Final  Blood culture (routine x 2)     Status: None   Collection Time: 03/25/24  2:57 AM   Specimen: BLOOD RIGHT ARM  Result Value Ref Range Status   Specimen Description BLOOD RIGHT ARM  Final   Special Requests   Final    BOTTLES DRAWN AEROBIC AND ANAEROBIC Blood Culture results may not be optimal due to an inadequate volume of blood received in culture bottles   Culture   Final    NO GROWTH 5 DAYS Performed at [facility], [address] [place], [place] [address]    Report Status 03/30/2024 FINAL  Final  MRSA Next Gen by PCR, Nasal     Status: Abnormal   Collection Time: 03/25/24  5:43 AM   Specimen: Nasal Mucosa; Nasal Swab  Result Value Ref Range Status   MRSA by PCR Next Gen DETECTED (A) NOT DETECTED Final    Comment: RESULT CALLED TO, READ BACK BY AND VERIFIED WITH: HEATHER FISHER 03/25/24 1015 MW (NOTE) The GeneXpert MRSA Assay (FDA approved for NASAL specimens only), is one component of a comprehensive MRSA colonization surveillance program. It is not intended to diagnose MRSA infection nor to guide or monitor treatment for MRSA infections. Test performance is not FDA approved in patients less than 49 years old. Performed at [facility], [address] [place], [place] [address]   Resp panel by RT-PCR (RSV, Flu A&B, Covid) Anterior Nasal Swab     Status: None   Collection Time: 04/01/24  4:39 PM   Specimen: Anterior Nasal Swab  Result Value Ref Range Status   SARS Coronavirus 2 by RT PCR NEGATIVE NEGATIVE Final    Comment: (NOTE) SARS-CoV-2 target nucleic acids are NOT DETECTED.  The SARS-CoV-2 RNA is generally detectable in upper respiratory specimens during the acute phase of  infection. The lowest concentration of SARS-CoV-2 viral copies this assay can detect is 138 copies/mL. A negative result does not preclude SARS-Cov-2 infection and should not be used as the sole basis for treatment or other patient management decisions. A negative result may occur with  improper specimen collection/handling, submission of specimen other than nasopharyngeal swab, presence of viral mutation(s) within the areas targeted by this assay, and inadequate number of viral copies(<138 copies/mL). A negative result must be combined with clinical observations, patient history, and epidemiological information. The expected result is Negative.  Fact Sheet for Patients:  bloggercourse.com  Fact Sheet for Healthcare Providers:  seriousbroker.it  This test is no t yet approved or cleared by the [place]  FDA and  has been authorized for detection and/or diagnosis of SARS-CoV-2 by FDA under an Emergency Use Authorization (EUA). This EUA will remain  in effect (meaning this test can be used) for the duration of the COVID-19 declaration under Section 564(b)(1) of the Act, 21 U.S.C.section 360bbb-3(b)(1), unless the authorization is terminated  or revoked sooner.       Influenza A by PCR NEGATIVE NEGATIVE Final   Influenza B by PCR NEGATIVE NEGATIVE Final    Comment: (NOTE) The Xpert Xpress SARS-CoV-2/FLU/RSV plus assay is intended as an aid in the diagnosis of influenza from Nasopharyngeal swab specimens and should not be used as a sole basis for treatment. Nasal washings and aspirates are unacceptable for Xpert Xpress SARS-CoV-2/FLU/RSV testing.  Fact Sheet for Patients: bloggercourse.com  Fact Sheet for Healthcare Providers: seriousbroker.it  This test is not yet approved or cleared by the [place]  FDA and has been authorized for detection and/or diagnosis of SARS-CoV-2  by FDA under an Emergency Use Authorization (EUA). This EUA will remain in effect (meaning this test can be used) for the duration of the COVID-19 declaration under Section 564(b)(1) of the Act, 21 U.S.C. section 360bbb-3(b)(1), unless the authorization is terminated or revoked.     Resp Syncytial Virus by PCR NEGATIVE NEGATIVE Final    Comment: (NOTE) Fact Sheet for Patients: bloggercourse.com  Fact Sheet for Healthcare Providers: seriousbroker.it  This test is not yet approved or cleared by the [place]  FDA and has been authorized for detection and/or diagnosis of SARS-CoV-2 by FDA under an Emergency Use Authorization (EUA). This  EUA will remain in effect (meaning this test can be used) for the duration of the COVID-19 declaration under Section 564(b)(1) of the Act, 21 U.S.C. section 360bbb-3(b)(1), unless the authorization is terminated or revoked.  Performed at [facility], [address], [place], [place] [address]      Radiology Studies: DG Chest Portable 1 View Result Date: 04/01/2024 CLINICAL DATA:  Shortness of breath. EXAM: PORTABLE CHEST 1 VIEW COMPARISON:  03/24/2024, 06/26/2023 FINDINGS: Lungs are hypoinflated with persistent left base/retrocardiac opacification possible slight interval improvement as these findings were also present April 2025. Persistent airspace opacification over the right upper lobe/apex new compared April 2025. Cardiomediastinal silhouette and remainder of the exam is unchanged. IMPRESSION: Right upper lobe/apical airspace process which is stable in may represent focal infection. Chronic left base opacification. Electronically Signed   By: Toribio Agreste M.D.   On: 04/01/2024 14:49    Scheduled Meds:  apixaban   5 mg Oral BID   aspirin  EC  81 mg Oral Daily   benazepril   10 mg Oral Daily   buPROPion   300 mg Oral Daily   carvedilol   3.125 mg Oral BID WC   escitalopram   20 mg Oral  Daily   finasteride   5 mg Oral Daily   furosemide   20 mg Oral Daily   gabapentin   200 mg Oral BID   guaiFENesin   600 mg Oral BID   insulin  aspart  0-15 Units Subcutaneous TID WC   insulin  aspart  0-5 Units Subcutaneous QHS   insulin  glargine  15 Units Subcutaneous Daily   ipratropium  0.5 mg Nebulization Q6H   magnesium  oxide  400 mg Oral Daily   pantoprazole   40 mg Oral Daily   pravastatin   40 mg Oral Daily   spironolactone   12.5 mg Oral Daily   tamsulosin   0.4 mg Oral Daily   Continuous Infusions:  ampicillin -sulbactam (UNASYN ) IV Stopped (04/02/24 0717)     LOS: 1 day  MDM: Patient is high risk for one or more organ failure.  They necessitate ongoing hospitalization for continued IV therapies and subsequent lab monitoring. Total time spent interpreting labs and vitals, reviewing the medical record, coordinating care amongst consultants and care team members, directly assessing and discussing care with the patient and/or family: 55 min Laree Lock, MD [facility]  To contact the attending physician between 7A-7P please use Epic Chat. To contact the covering physician during after hours 7P-7A, please review Amion.  04/02/2024, 8:32 AM   *This document has been created with the assistance of dictation software. Please excuse typographical errors. *   "

## 2024-04-02 NOTE — Evaluation (Signed)
 Clinical/Bedside Swallow Evaluation Patient Details  Name: Derek Andersen MRN: [id number] Date of Birth: Feb 15, 1941  Today's Date: 04/02/2024 Time: SLP Start Time (ACUTE ONLY): 9144 SLP Stop Time (ACUTE ONLY): 0955 SLP Time Calculation (min) (ACUTE ONLY): 60 min  Past Medical History:  Past Medical History:  Diagnosis Date   Arthritis    Atrial fibrillation (HCC)    a.) CHA2DS2-VASc = 7 (age x 2, HTN, DVT x 2, prior MI, T2DM). b.) s/p TEE with cardioversion (200J x 2) 12/06/2013. c.) chronically anticoagulated using apixaban    BPH (benign prostatic hyperplasia)    CAD (coronary artery disease)    a.) MI 12/31/83 -> LHC: 95% pLAD-1, 100% pLAD-2, 50% mLAD. b.) LHC 05/08/88: 50 pRCA, 95% mLAD. c.) LHC 02/17/02: 25% RCA, 25% LM, 75% OM1; PCI -> 0.25 x 12mm Medtronic stent to OM1. d.) LHC 08/17/02: <25% OM1, 25% RI, 25% LAD. e.) LHC 09/02/03: 100% pLAD, 75% mLAD-1, 50% mLAD-2, <25% pRCA, <25% LM, 25% pLCx, <25% OM2, 25% RI. f.) LHC 11/23/13: 100% LAD; ref to CVTS. g.) MIDCAB (LIMA-LAD) 11/29/13   CKD (chronic kidney disease), stage IV (HCC)    Deep vein thrombosis (DVT) of right lower extremity (HCC) 1985   DISH (diffuse idiopathic skeletal hyperostosis)    Erectile dysfunction    Gait instability    GERD (gastroesophageal reflux disease)    History of 2019 novel coronavirus disease (COVID-19) 11/20/2018   HLD (hyperlipidemia)    Hypertension    IDA (iron deficiency anemia)    Long term current use of anticoagulant    a.) Apixaban    MI (myocardial infarction) (HCC) 12/31/1983   a.) treated in [place], [place]. b.) LHC: 95% pLAD-1, 100% pLAD-2, 50% mLAD; no intervention.   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 b.) gun in the home   T2DM (type 2 diabetes mellitus) (HCC)    Past Surgical History:  Past Surgical History:  Procedure Laterality Date   ANKLE SURGERY Right 1989   plate in ankle   BACK SURGERY     11x Degernerative disk   CARDIAC CATHETERIZATION Left 08/17/2002   Procedure: CARDIAC CATHETERIZATION; Location: [facility]; Surgeon: Lamar Hasten, MD   CARDIAC CATHETERIZATION Left 11/23/2013   Procedure: CARDIAC CATHETERIZATION; Location: [facility]; Surgeon: Joshua, MD   CARDIAC CATHETERIZATION Left 12/31/1983   Procedure: CARDIAC CATHETERIZATION; Location: [facility]; Surgeon: Miquel Ellen, MD   CARDIAC CATHETERIZATION Left 05/08/1988   Procedure: CARDIAC CATHETERIZATION; Location: [facility]; Surgeon: Lucian Never, MD   CARDIAC CATHETERIZATION Left 09/02/2003   Procedure: CARDIAC CATHETERIZATION; Location: [facility]; Surgeon: Ozell Estelle, MD   CORONARY ANGIOPLASTY WITH STENT PLACEMENT Left 02/17/2002   Procedure: CORONARY ANGIOPLASTY WITH STENT PLACEMENT (0.25 x 12 mm Medtronic stent to OM1); Location: [facility]   CYSTOSCOPY W/ RETROGRADES  02/09/2021   Procedure: CYSTOSCOPY WITH RETROGRADE PYELOGRAM;  Surgeon: Francisca Redell BROCKS, MD;  Location: [facility];  Service: Urology;;   CYSTOSCOPY/URETEROSCOPY/HOLMIUM LASER/STENT PLACEMENT Left 02/09/2021   Procedure: CYSTOSCOPY/URETEROSCOPY/HOLMIUM LASER/STENT PLACEMENT;  Surgeon: Francisca Redell BROCKS, MD;  Location: [facility];  Service: Urology;  Laterality: Left;   MINIMALLY INVASIVE DIRECT CORONARY ARTERY  BYPASS (MIDCAB; LIMA -LAD) Left 11/29/2013   Procedure: MINIMALLY INVASIVE DIRECT CORONARY ARTERY BYPASS (MIDCAB; LIMA -LAD); Location: [facility]   HPI:  Pt is an 84 y.o. male with medical history significant of extensive spine hardware and limited use of his UEs, CAD, sCHF, DM2, Afib, recent cardiac arrest, recently started on 3L O2, w/ recent hospitalization and SOB w/ O2 desat. 03/25/24-03/28/24.  During his  just recent Prior admit/discharge from [facility] (03/24/24) where he presented to [facility] on 03/10/24 initially for back pain, he suffered a cardiac arrest when he was laid flat for MRI scan.  Pt said due to hardware in his neck, lying flat causes him to stop breathing, and due to having received sedation for MRI, he wasn't able to prevent being laid flat.  During that hospitalization at [facility], pt said he was treated for PNA with 2 abx (completed), and was found to be pos for Rhinovirus infection.  Pt was discharged with 3L home O2 and pt chose to go home instead of SNF rehab.  Pt said after he got home, he started having more shortness of breath.  Caregiver increased his O2 to 6L, but O2 sats were still 71%, so pt presented to [facility] ED.   Post going home this time, pt was being fed by his Caregiver and Choked on a Cheesedog while being fed too fast per pt report- he is dependent for feeding.  He admitted yesterday 04/01/24 for SOB, lethargy, and low O2 sats(on chronic home O2).   Chest Imaging: Right upper lobe/apical airspace process which is stable in may  represent focal infection. Chronic left base opacification.  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  Pt endorsed indigestion; REFLUX s/s. He is on a PPI currently.    Assessment / Plan / Recommendation  Clinical Impression  Pt seen for BSE this morning in the ED. Pt awakened easily from resting. Pt was verbal; answered questions appropriately. A/O x4. Unable to use his UEs to feed self- BASELINE. He is dependent for feeding by his Caregivers/Family at home. Pt reported he choked yesterday when my Caregiver shoveled the cheesedog in me too fast.  On NNC O2 2-3L(chronic O2 at home); afebrile. WBC WNL.   Per Chart: OP VFSS August, 2020: Patient presents with oropharyngeal swallowing that is grossly [facility]. Although oral stage was somewhat prolonged, Pt endorses intentionally taking  his time with chewing. No penetration or aspiration was exhibited with any consistency given. A non-diagnostic esophageal sweep revealed Esophageal Retention with possible Retrograde flow; Pt denies dysphagia symptoms. Based on the results of this evaluation, recommend continued regular diet/thin liquids, with aspiration precautions as detailed below. No further speech-language pathology intervention currently indicated. Pt/family in agreement with plan/recommendations..  OF NOTE: Pt endorsed Indigestion s/s at home stating he takes TUMS- unsure if on a PPI at home. 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 Recommend f/u w/ GI for formal assessment and management; PPI.  ANY Dysmotility or Regurgitation of RELFUX material can increase risk for aspiration of that material during Retrograde flow thus impact Voicing and Pulmonary status. Pt described issues of belching and globus sometimes.   During BSE, pt appeared to present w/ functional oropharyngeal phase swallow w/ No overt oropharyngeal phase dysphagia noted, No sensorimotor deficits noted. Pt consumed po trials w/ No overt, clinical s/s of aspiration during the po trials.  Pt appears at reduced risk for aspiration  from an oropharyngeal phase standpoint when following general aspiration precautions and offering careful feeding at meals. However, pt does have challenging factors that could impact oropharyngeal swallowing to include back discomfort/positioning, suspected impact from Esophageal phase Dysmotility; Feeding Dependency; Deconditioning and Chronic comorbidities. These factors can increase risk for dysphagia as well as decreased oral intake overall.   During po trials, pt consumed all consistencies fed to him by this SLP w/ no overt coughing, decline in vocal quality, or change in respiratory presentation during/post trials. O2 sats 99%. Oral phase appeared [facility] w/ timely bolus management, mastication,  and control of bolus propulsion for A-P transfer for swallowing. Oral clearing achieved w/ all trial consistencies -- moistened, soft foods given. Pt stated he chews carefully and slowly w/ certain foods. OM Exam appeared [facility] w/ no unilateral weakness noted. Speech Clear.   Recommend a Regular consistency diet as at home but w/ well-Cut meats and LESS meat/bread combination d/t the Bulky nature of those foods -- recommend more moistened foods. Thin liquids -- carefully monitor straw use and offer small sips slowly. Recommend general aspiration precautions including sitting fully upright for po intake/meds. Rest Breaks during meals and REFLUX PRECAUTIONS. Pills WHOLE in Puree for safer, easier swallowing -- pt did this during the session/eval successfully, and it was encouraged now and for D/C to the pt/NSG. Full feeding support- baseline.  Education given on Pills in Puree; food consistencies/prep and easy to eat options; general aspiration and REFLUX precautions to pt. Handouts given on information discussed w/ him.  No further skilled ST services indicated at this time as pt appears at his swallowing Baseline. MD/NSG to reconsult if any new needs arise while admitted. NSG updated, agreed. MD updated. Recommend Dietician f/u for support. Precautions posted in room, chart.   SLP Visit Diagnosis: Dysphagia, unspecified (R13.10) (suspect impact from Esophageal phase Dysmotility; Feeding Dependency; Deconditioning and Chronic comorbidities.)    Aspiration Risk  Mild aspiration risk;Risk for inadequate nutrition/hydration (from an Esophageal phase standpoint/Regurgitation of REFLUX; reduced from an oropharyngeal phase standpoint)    Diet Recommendation   Thin;Age appropriate regular;Dysphagia 3 (mechanical soft) (cut/chopped meats; less bread&meat together d/t Bulkiness. Moistened foods.) = a Regular consistency diet as at home but w/ well-Cut meats and LESS meat/bread combination d/t the Bulky nature of  those foods -- recommend more moistened foods. Thin liquids -- carefully monitor straw use and offer small sips slowly. Recommend general aspiration precautions including sitting fully upright for po intake/meds. Rest Breaks during meals and REFLUX PRECAUTIONS. Full feeding support- Baseline.  Medication Administration: Whole meds with puree    Other Recommendations Recommended Consults: Consider GI evaluation;Consider esophageal assessment (Dietician f/u) Oral Care Recommendations: Oral care BID;Staff/trained caregiver to provide oral care     Swallow Evaluation Recommendations  See above   Assistance Recommended at Discharge  FULL at meals  Functional Status Assessment Patient has not had a recent decline in their functional status (no decline in oropharyngeal phase swallowing)  Frequency and Duration  (n/a)   (n/a)       Prognosis Prognosis for improved oropharyngeal function: Good Barriers to Reach Goals: Time post onset;Severity of deficits Barriers/Prognosis Comment: suspect impact from Esophageal phase Dysmotility; Feeding Dependency; Deconditioning and Chronic comorbidities.      Swallow Study   General Date of Onset: 04/01/24 HPI: Pt is an 84 y.o. male with medical history significant of extensive spine hardware and limited use of his UEs, CAD, sCHF, DM2, Afib, recent cardiac arrest, recently started on 3L O2, w/  recent hospitalization and SOB w/ O2 desat. 03/25/24-03/28/24.  During his just recent Prior admit/discharge from [facility] (03/24/24) where he presented to [facility] on 03/10/24 initially for back pain, he suffered a cardiac arrest when he was laid flat for MRI scan.  Pt said due to hardware in his neck, lying flat causes him to stop breathing, and due to having received sedation for MRI, he wasn't able to prevent being laid flat.  During that hospitalization at [facility], pt said he was treated for PNA with 2 abx (completed), and was found to be pos for Rhinovirus infection.  Pt  was discharged with 3L home O2 and pt chose to go home instead of SNF rehab.  Pt said after he got home, he started having more shortness of breath.  Caregiver increased his O2 to 6L, but O2 sats were still 71%, so pt presented to [facility] ED.   Post going home this time, pt was being fed by his Caregiver and Choked on a Cheesedog while being fed too fast per pt report- he is dependent for feeding.  He admitted yesterday 04/01/24 for SOB, lethargy, and low O2 sats(on chronic home O2).   Chest Imaging: Right upper lobe/apical airspace process which is stable in may  represent focal infection. Chronic left base opacification.  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  Pt endorsed indigestion; REFLUX s/s. He is on a PPI currently. Type of Study: Bedside Swallow Evaluation Previous Swallow Assessment: 2020- MBSS = regular diet; thins; BSE 07/2023 = regular diet, thins. Diet Prior to this Study: NPO (regular diet at home) Temperature Spikes Noted: No (wbc 8.3) Respiratory Status: Nasal cannula (2-3L - chronic O2 at home) History of Recent Intubation: No Behavior/Cognition: Alert;Cooperative;Pleasant mood Oral Cavity Assessment: Within Functional Limits Oral Care Completed by SLP: Recent completion by staff Oral Cavity - Dentition: Adequate natural dentition Vision:  (n/a) Self-Feeding Abilities: Total assist (unable to use UEs for self-feeding - chronic) Patient Positioning: Upright in bed (full assist) Baseline Vocal Quality: Normal Volitional Cough: Strong Volitional Swallow: Able to elicit    Oral/Motor/Sensory Function Overall Oral Motor/Sensory Function: Within functional limits   Ice Chips Ice chips: Within functional limits Presentation: Spoon (fed; 2 trials)   Thin Liquid Thin Liquid: Within functional limits Presentation: Straw (educated on small sips slowly- 15+)    Nectar Thick Nectar Thick Liquid: Not tested   Honey  Thick Honey Thick Liquid: Not tested   Puree Puree: Within functional limits Presentation: Spoon (fed; 12+) Other Comments: pills whole in Puree   Solid     Solid: Within functional limits Presentation: Spoon (fed; 9 trials) Other Comments: moistened well         Comer Portugal, MS, CCC-SLP Speech Language Pathologist Rehab Services; [facility] [place] [phone] (ascom) Anju Sereno 04/02/2024,5:11 PM

## 2024-04-03 DIAGNOSIS — J189 Pneumonia, unspecified organism: Secondary | ICD-10-CM | POA: Diagnosis not present

## 2024-04-03 LAB — GLUCOSE, CAPILLARY
Glucose-Capillary: 154 mg/dL — ABNORMAL HIGH (ref 70–99)
Glucose-Capillary: 162 mg/dL — ABNORMAL HIGH (ref 70–99)
Glucose-Capillary: 288 mg/dL — ABNORMAL HIGH (ref 70–99)
Glucose-Capillary: 224 mg/dL — ABNORMAL HIGH (ref 70–99)

## 2024-04-03 LAB — CBC
HCT: 29 % — ABNORMAL LOW (ref 39.0–52.0)
Hemoglobin: 8.9 g/dL — ABNORMAL LOW (ref 13.0–17.0)
MCH: 28.9 pg (ref 26.0–34.0)
MCHC: 30.7 g/dL (ref 30.0–36.0)
MCV: 94.2 fL (ref 80.0–100.0)
Platelets: 300 10*3/uL (ref 150–400)
RBC: 3.08 MIL/uL — ABNORMAL LOW (ref 4.22–5.81)
RDW: 15.6 % — ABNORMAL HIGH (ref 11.5–15.5)
WBC: 7.2 10*3/uL (ref 4.0–10.5)
nRBC: 0 % (ref 0.0–0.2)

## 2024-04-03 LAB — BASIC METABOLIC PANEL WITH GFR
Anion gap: 7 (ref 5–15)
BUN: 26 mg/dL — ABNORMAL HIGH (ref 8–23)
CO2: 32 mmol/L (ref 22–32)
Calcium: 9.2 mg/dL (ref 8.9–10.3)
Chloride: 99 mmol/L (ref 98–111)
Creatinine, Ser: 1.18 mg/dL (ref 0.61–1.24)
GFR, Estimated: >60 mL/min
Glucose, Bld: 146 mg/dL — ABNORMAL HIGH (ref 70–99)
Potassium: 4 mmol/L (ref 3.5–5.1)
Sodium: 138 mmol/L (ref 135–145)

## 2024-04-03 MED ORDER — SODIUM CHLORIDE 3 % IN NEBU
4.0000 mL | INHALATION_SOLUTION | Freq: Every day | RESPIRATORY_TRACT | Status: AC | PRN
  Administered 2024-04-05: 4 mL via RESPIRATORY_TRACT
  Filled 2024-04-03 (×2): qty 4

## 2024-04-03 NOTE — Plan of Care (Signed)

## 2024-04-03 NOTE — Progress Notes (Signed)
 " PROGRESS NOTE    Derek Andersen  FMW:[id number] DOB: 1940/08/10 DOA: 04/01/2024 PCP: Diedra Lame, MD  Chief Complaint  Patient presents with   Respiratory Distress    Hospital Course:  Derek Andersen is a 84 y.o. 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 Patient was on BiPAP and severe respiratory distress on presentation to ER.  Daughter provided history per chart review  Patient was recently hospitalized for a pneumonia process and completed antibiotics and discharged home.  At baseline patient need to be fed for his food because chronic bilateral hands weakness secondary to cervical spine problems.  while eating a corn dog patient started to choking which family attributed to  he was fed too fast and family denied any history of aspiration before.  Patient was found to be cyanotic and O2 saturation dropped to below 50 and EMS was called.  EMS arrived and patient was placed on nonrebreather.  However, there was no history of cough or choke after eat or drink Was placed on BiPAP on presentation, improved clinically on 3 L [place] which is baseline.  Continuing IV antibiotics, refused to CT. Hospital course as below   On New Year's Day, patient had some back pain and dysuria went to [facility] ED when he was diagnosed with a kidney infection, while lying down on the MRI machine patient started to have choking which family attributed to  he cannot lie last for his neck problem, lying flat but related to the aspiration event and patient was found to be hypoxic and short course of CPR performed and patient recovered consciousness.  He was subsequently treated with Zosyn  for 10 days for aspiration pneumonia.  Subjective: Patient was examined at the bedside,  doing slightly better Continues to have SOB and congestion Will continue IV antibiotics, added hypertonic saline nebs Anticipate discharge tomorrow on p.o. antibiotics if stable   Objective: Vitals:   04/03/24 0547 04/03/24 0725 04/03/24 0746 04/03/24 1159  BP:   (!) 118/48 (!) 114/47  Pulse:   83 70  Resp:   18 20  Temp:   98.4 F (36.9 C) 97.9 F (36.6 C)  TempSrc:      SpO2: 92% 98% 98% 100%  Weight:      Height:        Intake/Output Summary (Last 24 hours) at 04/03/2024 1629 Last data filed at 04/03/2024 1415 Gross per 24 hour  Intake 550 ml  Output 2000 ml  Net -1450 ml   Filed Weights   04/01/24 1412  Weight: 98.7 kg    Examination: Constitutional: NAD, calm, comfortable Neck: normal, supple, no masses, no thyromegaly Respiratory: clear to auscultation bilaterally, no wheezing, bilateral crackles. No accessory muscle use.  Cardiovascular: Regular rate and rhythm, no murmurs / rubs / gallops. No extremity edema. 2+ pedal pulses. No carotid bruits  Abdomen: no tenderness, no masses palpated. No hepatosplenomegaly. Bowel sounds positive Musculoskeletal: no clubbing / cyanosis. No joint deformity upper and lower extremities. Good ROM, no contractures. Normal muscle tone Skin: no rashes, lesions, ulcers. No induration Neurologic: CN 2-12 grossly intact. Sensation intact, DTR normal. Strength 5/5 in all 4.  Psychiatric: Normal judgment and insight. Alert and oriented x 3. Normal mood  Assessment & Plan:  Acute on chronic hypoxic and  hypercapnic respiratory failure Aspiration pneumonia, recurrent Was on BiPAP on presentation, weaned down to 3 L [place] which is baseline Suspect patient had episode of choking due to aspiration.  Refused CT due to having episode of unresponsiveness/PEA at [facility], states cannot lay flat COVID/flu/RSV negative CXR shows RUL/apical airspace process stable, may represent focal infection, chronic left base opacification Continue IV Unasyn  Seen by  SLP, aspiration and reflux precautions provided Add hypertonic saline nebs, Mucinex  Incentive spirometry, flutter valve   Chronic HFrEF BNP elevated, mild hypervolemia Elevated troponin likely due to demand ischemia  Home Lasix , spironolactone    PAF continue Eliquis  and Coreg    IDDM HbA1c 7.0 Lantus  15 units (home dose 30 units) and sliding scale  HTN Home Benazepril  10 mg, Coreg  IV hydralazine  with holding parameters  Normocytic anemia Monitor Hb  Chronic back pain Continue gabapentin    Anxiety/depression Continue Lexapro , Wellbutrin , as needed Xanax   Stage II sacral wound (POA) WOC consult  OSA CPAP at night  Obesity class II Body mass index is 35.12 kg/m. Outpatient follow up for lifestyle modification and risk factor management  --Bedbound at baseline Will resume home services at discharge   DVT prophylaxis: Eliquis    Code Status: Limited: Do not attempt resuscitation (DNR) -DNR-LIMITED -Do Not Intubate/DNI  Disposition:  Home  Consultants:  None  Procedures:  None  Antimicrobials:  Anti-infectives (From admission, onward)    Start     Dose/Rate Route Frequency Ordered Stop   04/01/24 1830  Ampicillin -Sulbactam (UNASYN ) 3 g in sodium chloride  0.9 % 100 mL IVPB        3 g 200 mL/hr over 30 Minutes Intravenous Every 6 hours 04/01/24 1800         Data Reviewed: I have personally reviewed following labs and imaging studies CBC: Recent Labs  Lab 04/01/24 1420 04/02/24 0415 04/02/24 0954 04/03/24 0506  WBC 7.1 8.1 8.5 7.2  HGB 9.6* 8.7* 8.6* 8.9*  HCT 31.5* 28.3* 28.1* 29.0*  MCV 96.3 95.0 95.6 94.2  PLT 375 303 295 300   Basic Metabolic Panel: Recent Labs  Lab 03/28/24 0540 04/01/24 1420 04/02/24 0954 04/03/24 0506  NA 136 137 138 138  K 4.3 5.0 4.3 4.0  CL 98 99 100 99  CO2 30 30 30  32  GLUCOSE 138* 188* 224* 146*  BUN 42* 31* 28* 26*  CREATININE 1.32* 1.36* 1.21 1.18  CALCIUM 9.0 8.8* 8.9 9.2   GFR: Estimated Creatinine  Clearance: 54 mL/min (by C-G formula based on SCr of 1.18 mg/dL). Liver Function Tests: Recent Labs  Lab 04/01/24 1420  AST 19  ALT <5  ALKPHOS 194*  BILITOT 0.2  PROT 7.0  ALBUMIN 3.3*   CBG: Recent Labs  Lab 04/02/24 1122 04/02/24 1633 04/02/24 2104 04/03/24 0836 04/03/24 1309  GLUCAP 183* 272* 291* 154* 162*    Recent Results (from the past 240 hours)  Resp panel by RT-PCR (RSV, Flu A&B, Covid) Anterior Nasal Swab     Status: None   Collection Time: 03/25/24  2:56 AM   Specimen: Anterior Nasal Swab  Result Value Ref Range Status   SARS Coronavirus 2 by RT PCR NEGATIVE NEGATIVE Final    Comment: (NOTE) SARS-CoV-2 target nucleic acids are NOT DETECTED.  The SARS-CoV-2 RNA is generally detectable in upper respiratory specimens during the acute phase of infection. The lowest concentration of SARS-CoV-2 viral copies this assay can detect is 138 copies/mL. A negative result does not preclude SARS-Cov-2 infection and should not be used as the sole basis  for treatment or other patient management decisions. A negative result may occur with  improper specimen collection/handling, submission of specimen other than nasopharyngeal swab, presence of viral mutation(s) within the areas targeted by this assay, and inadequate number of viral copies(<138 copies/mL). A negative result must be combined with clinical observations, patient history, and epidemiological information. The expected result is Negative.  Fact Sheet for Patients:  bloggercourse.com  Fact Sheet for Healthcare Providers:  seriousbroker.it  This test is no t yet approved or cleared by the [place]  FDA and  has been authorized for detection and/or diagnosis of SARS-CoV-2 by FDA under an Emergency Use Authorization (EUA). This EUA will remain  in effect (meaning this test can be used) for the duration of the COVID-19 declaration under Section 564(b)(1) of  the Act, 21 U.S.C.section 360bbb-3(b)(1), unless the authorization is terminated  or revoked sooner.       Influenza A by PCR NEGATIVE NEGATIVE Final   Influenza B by PCR NEGATIVE NEGATIVE Final    Comment: (NOTE) The Xpert Xpress SARS-CoV-2/FLU/RSV plus assay is intended as an aid in the diagnosis of influenza from Nasopharyngeal swab specimens and should not be used as a sole basis for treatment. Nasal washings and aspirates are unacceptable for Xpert Xpress SARS-CoV-2/FLU/RSV testing.  Fact Sheet for Patients: bloggercourse.com  Fact Sheet for Healthcare Providers: seriousbroker.it  This test is not yet approved or cleared by the [place]  FDA and has been authorized for detection and/or diagnosis of SARS-CoV-2 by FDA under an Emergency Use Authorization (EUA). This EUA will remain in effect (meaning this test can be used) for the duration of the COVID-19 declaration under Section 564(b)(1) of the Act, 21 U.S.C. section 360bbb-3(b)(1), unless the authorization is terminated or revoked.     Resp Syncytial Virus by PCR NEGATIVE NEGATIVE Final    Comment: (NOTE) Fact Sheet for Patients: bloggercourse.com  Fact Sheet for Healthcare Providers: seriousbroker.it  This test is not yet approved or cleared by the [place]  FDA and has been authorized for detection and/or diagnosis of SARS-CoV-2 by FDA under an Emergency Use Authorization (EUA). This EUA will remain in effect (meaning this test can be used) for the duration of the COVID-19 declaration under Section 564(b)(1) of the Act, 21 U.S.C. section 360bbb-3(b)(1), unless the authorization is terminated or revoked.  Performed at [facility], [address] [place], [place] [address]   Blood culture (routine x 2)     Status: None   Collection Time: 03/25/24  2:56 AM   Specimen: BLOOD RIGHT HAND  Result  Value Ref Range Status   Specimen Description BLOOD RIGHT HAND  Final   Special Requests   Final    BOTTLES DRAWN AEROBIC AND ANAEROBIC Blood Culture results may not be optimal due to an inadequate volume of blood received in culture bottles   Culture   Final    NO GROWTH 5 DAYS Performed at [facility], [address] [place], [place] [address]    Report Status 03/30/2024 FINAL  Final  Blood culture (routine x 2)     Status: None   Collection Time: 03/25/24  2:57 AM   Specimen: BLOOD RIGHT ARM  Result Value Ref Range Status   Specimen Description BLOOD RIGHT ARM  Final   Special Requests   Final    BOTTLES DRAWN AEROBIC AND ANAEROBIC Blood Culture results may not be optimal due to an inadequate volume of blood received in culture bottles   Culture   Final  NO GROWTH 5 DAYS Performed at [facility], [address] [employer] [place], [place] [address]    Report Status 03/30/2024 FINAL  Final  MRSA Next Gen by PCR, Nasal     Status: Abnormal   Collection Time: 03/25/24  5:43 AM   Specimen: Nasal Mucosa; Nasal Swab  Result Value Ref Range Status   MRSA by PCR Next Gen DETECTED (A) NOT DETECTED Final    Comment: RESULT CALLED TO, READ BACK BY AND VERIFIED WITH: HEATHER FISHER 03/25/24 1015 MW (NOTE) The GeneXpert MRSA Assay (FDA approved for NASAL specimens only), is one component of a comprehensive MRSA colonization surveillance program. It is not intended to diagnose MRSA infection nor to guide or monitor treatment for MRSA infections. Test performance is not FDA approved in patients less than 3 years old. Performed at [facility], [address] [place], [place] [address]   Resp panel by RT-PCR (RSV, Flu A&B, Covid) Anterior Nasal Swab     Status: None   Collection Time: 04/01/24  4:39 PM   Specimen: Anterior Nasal Swab  Result Value Ref Range Status   SARS Coronavirus 2 by RT PCR NEGATIVE NEGATIVE Final    Comment: (NOTE) SARS-CoV-2 target  nucleic acids are NOT DETECTED.  The SARS-CoV-2 RNA is generally detectable in upper respiratory specimens during the acute phase of infection. The lowest concentration of SARS-CoV-2 viral copies this assay can detect is 138 copies/mL. A negative result does not preclude SARS-Cov-2 infection and should not be used as the sole basis for treatment or other patient management decisions. A negative result may occur with  improper specimen collection/handling, submission of specimen other than nasopharyngeal swab, presence of viral mutation(s) within the areas targeted by this assay, and inadequate number of viral copies(<138 copies/mL). A negative result must be combined with clinical observations, patient history, and epidemiological information. The expected result is Negative.  Fact Sheet for Patients:  bloggercourse.com  Fact Sheet for Healthcare Providers:  seriousbroker.it  This test is no t yet approved or cleared by the [place]  FDA and  has been authorized for detection and/or diagnosis of SARS-CoV-2 by FDA under an Emergency Use Authorization (EUA). This EUA will remain  in effect (meaning this test can be used) for the duration of the COVID-19 declaration under Section 564(b)(1) of the Act, 21 U.S.C.section 360bbb-3(b)(1), unless the authorization is terminated  or revoked sooner.       Influenza A by PCR NEGATIVE NEGATIVE Final   Influenza B by PCR NEGATIVE NEGATIVE Final    Comment: (NOTE) The Xpert Xpress SARS-CoV-2/FLU/RSV plus assay is intended as an aid in the diagnosis of influenza from Nasopharyngeal swab specimens and should not be used as a sole basis for treatment. Nasal washings and aspirates are unacceptable for Xpert Xpress SARS-CoV-2/FLU/RSV testing.  Fact Sheet for Patients: bloggercourse.com  Fact Sheet for Healthcare  Providers: seriousbroker.it  This test is not yet approved or cleared by the [place]  FDA and has been authorized for detection and/or diagnosis of SARS-CoV-2 by FDA under an Emergency Use Authorization (EUA). This EUA will remain in effect (meaning this test can be used) for the duration of the COVID-19 declaration under Section 564(b)(1) of the Act, 21 U.S.C. section 360bbb-3(b)(1), unless the authorization is terminated or revoked.     Resp Syncytial Virus by PCR NEGATIVE NEGATIVE Final    Comment: (NOTE) Fact Sheet for Patients: bloggercourse.com  Fact Sheet for Healthcare Providers: seriousbroker.it  This test is not yet approved or cleared  by the [place]  FDA and has been authorized for detection and/or diagnosis of SARS-CoV-2 by FDA under an Emergency Use Authorization (EUA). This EUA will remain in effect (meaning this test can be used) for the duration of the COVID-19 declaration under Section 564(b)(1) of the Act, 21 U.S.C. section 360bbb-3(b)(1), unless the authorization is terminated or revoked.  Performed at [facility], [address], [place], [place] [address]      Radiology Studies: No results found.   Scheduled Meds:  apixaban   5 mg Oral BID   aspirin  EC  81 mg Oral Daily   benazepril   10 mg Oral Daily   brimonidine   1 drop Both Eyes TID   buPROPion   300 mg Oral Daily   carvedilol   3.125 mg Oral BID WC   escitalopram   20 mg Oral Daily   finasteride   5 mg Oral Daily   furosemide   20 mg Oral Daily   gabapentin   200 mg Oral BID   Gerhardt's butt cream   Topical BID   guaiFENesin   600 mg Oral BID   insulin  aspart  0-15 Units Subcutaneous TID WC   insulin  aspart  0-5 Units Subcutaneous QHS   insulin  glargine  15 Units Subcutaneous Daily   ipratropium  0.5 mg Nebulization BID   magnesium  oxide  400 mg Oral Daily   pantoprazole   40 mg Oral Daily    pravastatin   40 mg Oral Daily   spironolactone   12.5 mg Oral Daily   tamsulosin   0.4 mg Oral Daily   Continuous Infusions:  ampicillin -sulbactam (UNASYN ) IV 3 g (04/03/24 1320)     LOS: 2 days  MDM: Patient is high risk for one or more organ failure.  They necessitate ongoing hospitalization for continued IV therapies and subsequent lab monitoring. Total time spent interpreting labs and vitals, reviewing the medical record, coordinating care amongst consultants and care team members, directly assessing and discussing care with the patient and/or family: 35 min Laree Lock, MD [facility]  To contact the attending physician between 7A-7P please use Epic Chat. To contact the covering physician during after hours 7P-7A, please review Amion.  04/03/2024, 4:29 PM   *This document has been created with the assistance of dictation software. Please excuse typographical errors. *   "

## 2024-04-04 DIAGNOSIS — J690 Pneumonitis due to inhalation of food and vomit: Secondary | ICD-10-CM | POA: Diagnosis not present

## 2024-04-04 LAB — GLUCOSE, CAPILLARY
Glucose-Capillary: 197 mg/dL — ABNORMAL HIGH (ref 70–99)
Glucose-Capillary: 204 mg/dL — ABNORMAL HIGH (ref 70–99)
Glucose-Capillary: 154 mg/dL — ABNORMAL HIGH (ref 70–99)
Glucose-Capillary: 172 mg/dL — ABNORMAL HIGH (ref 70–99)

## 2024-04-04 MED ORDER — INSULIN GLARGINE 100 UNIT/ML ~~LOC~~ SOLN
20.0000 [IU] | Freq: Every day | SUBCUTANEOUS | Status: DC
  Administered 2024-04-04: 20 [IU] via SUBCUTANEOUS
  Filled 2024-04-04 (×2): qty 0.2

## 2024-04-04 MED ORDER — HYDROCODONE BIT-HOMATROP MBR 5-1.5 MG/5ML PO SOLN: 5.0000 mL | Freq: Four times a day (QID) | ORAL | Status: DC | PRN

## 2024-04-04 MED ORDER — HYDROCOD POLI-CHLORPHE POLI ER 10-8 MG/5ML PO SUER
5.0000 mL | Freq: Two times a day (BID) | ORAL | Status: DC | PRN
  Administered 2024-04-04 – 2024-04-06 (×5): 5 mL via ORAL
  Filled 2024-04-04 (×5): qty 5

## 2024-04-04 MED ORDER — DOXYCYCLINE HYCLATE 100 MG PO TABS
100.0000 mg | ORAL_TABLET | Freq: Two times a day (BID) | ORAL | Status: AC
  Administered 2024-04-04 – 2024-04-08 (×10): 100 mg via ORAL
  Filled 2024-04-04 (×10): qty 1

## 2024-04-04 NOTE — Care Management Important Message (Signed)
 Important Message  Patient Details  Name: Derek Andersen MRN: [id number] Date of Birth: 1940-09-23   Important Message Given:  Yes - Medicare IM     Mende Biswell W, CMA 04/04/2024, 12:23 PM

## 2024-04-04 NOTE — Progress Notes (Signed)
 " PROGRESS NOTE    Derek Andersen  FMW:[id number] DOB: March 25, 1940 DOA: 04/01/2024 PCP: Diedra Lame, MD  Chief Complaint  Patient presents with   Respiratory Distress    Hospital Course:  Derek Andersen is a 84 y.o. 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 Patient was on BiPAP and severe respiratory distress on presentation to ER.  Daughter provided history per chart review  Patient was recently hospitalized for a pneumonia process and completed antibiotics and discharged home.  At baseline patient need to be fed for his food because chronic bilateral hands weakness secondary to cervical spine problems.  while eating a corn dog patient started to choking which family attributed to  he was fed too fast and family denied any history of aspiration before.  Patient was found to be cyanotic and O2 saturation dropped to below 50 and EMS was called.  EMS arrived and patient was placed on nonrebreather.  However, there was no history of cough or choke after eat or drink Was placed on BiPAP on presentation, improved clinically on 3 L [place] which is baseline.  Continuing IV antibiotics, refused to CT. Hospital course as below   On New Year's Day, patient had some back pain and dysuria went to [facility] ED when he was diagnosed with a kidney infection, while lying down on the MRI machine patient started to have choking which family attributed to  he cannot lie last for his neck problem, lying flat but related to the aspiration event and patient was found to be hypoxic and short course of CPR performed and patient recovered consciousness.  He was subsequently treated with Zosyn  for 10 days for aspiration pneumonia.  Subjective: Patient was examined at the bedside,  doing slightly better Continues to have SOB and congestion and cough overnight Will continue IV antibiotics, add po doxycycline , add Tussinex and chest PT Anticipate discharge tomorrow on p.o. antibiotics if stable Called daughter Jori, provided updates and answered all questions   Objective: Vitals:   04/04/24 0404 04/04/24 0414 04/04/24 0711 04/04/24 1200  BP: (!) 110/36 (!) 143/61  (!) 145/66  Pulse: 66 70  85  Resp: 18   19  Temp: 98.8 F (37.1 C)   98.8 F (37.1 C)  TempSrc:    Oral  SpO2: 99%  94% 95%  Weight:      Height:        Intake/Output Summary (Last 24 hours) at 04/04/2024 1624 Last data filed at 04/04/2024 1012 Gross per 24 hour  Intake 840 ml  Output 950 ml  Net -110 ml   Filed Weights   04/01/24 1412  Weight: 98.7 kg    Examination: Constitutional: NAD, calm, comfortable Neck: normal, supple, no masses, no thyromegaly Respiratory: clear to auscultation bilaterally, no wheezing, bilateral crackles. No accessory muscle use.  Cardiovascular: Regular rate and rhythm, no murmurs / rubs / gallops. No extremity edema. 2+ pedal pulses. No carotid bruits  Abdomen: no tenderness, no masses palpated. No hepatosplenomegaly. Bowel sounds positive Musculoskeletal: no clubbing / cyanosis. No joint deformity upper and lower extremities. Good ROM, no contractures. Normal muscle tone Skin: no rashes, lesions, ulcers. No induration Neurologic: CN 2-12 grossly intact. Sensation intact, DTR normal. Strength 5/5 in all 4.  Psychiatric: Normal judgment and insight.  Alert and oriented x 3. Normal mood  Assessment & Plan:  Acute on chronic hypoxic and hypercapnic respiratory failure Aspiration pneumonia, recurrent Was on BiPAP on presentation, weaned down to 3 L [place] which is baseline Suspect patient had episode of choking due to aspiration.  Refused CT due to having episode of unresponsiveness/PEA at [facility], states cannot lay flat COVID/flu/RSV negative CXR shows RUL/apical  airspace process stable, may represent focal infection, chronic left base opacification Continue IV Unasyn , add doxycycline  Seen by SLP, aspiration and reflux precautions provided hypertonic saline nebs, Tussionex Incentive spirometry, flutter valve, Chest PT   Chronic HFrEF BNP elevated, mild hypervolemia Elevated troponin likely due to demand ischemia  Home Lasix , spironolactone    PAF continue Eliquis  and Coreg    IDDM HbA1c 7.0 Inc Lantus  20 units (home dose 30 units) and sliding scale  HTN Home Benazepril  10 mg, Coreg  IV hydralazine  with holding parameters  Normocytic anemia Monitor Hb  Chronic back pain Continue gabapentin    Anxiety/depression Continue Lexapro , Wellbutrin , as needed Xanax   Stage II sacral wound (POA) WOC consult  OSA CPAP at night  Obesity class II Body mass index is 35.12 kg/m. Outpatient follow up for lifestyle modification and risk factor management  --Bedbound at baseline Will resume home services at discharge PT/OT eval   DVT prophylaxis: Eliquis    Code Status: Limited: Do not attempt resuscitation (DNR) -DNR-LIMITED -Do Not Intubate/DNI  Disposition:  Home  Consultants:  None  Procedures:  None  Antimicrobials:  Anti-infectives (From admission, onward)    Start     Dose/Rate Route Frequency Ordered Stop   04/04/24 1330  doxycycline  (VIBRA -TABS) tablet 100 mg        100 mg Oral Every 12 hours 04/04/24 1235     04/01/24 1830  Ampicillin -Sulbactam (UNASYN ) 3 g in sodium chloride  0.9 % 100 mL IVPB        3 g 200 mL/hr over 30 Minutes Intravenous Every 6 hours 04/01/24 1800         Data Reviewed: I have personally reviewed following labs and imaging studies CBC: Recent Labs  Lab 04/01/24 1420 04/02/24 0415 04/02/24 0954 04/03/24 0506  WBC 7.1 8.1 8.5 7.2  HGB 9.6* 8.7* 8.6* 8.9*  HCT 31.5* 28.3* 28.1* 29.0*  MCV 96.3 95.0 95.6 94.2  PLT 375 303 295 300   Basic Metabolic Panel: Recent Labs  Lab 04/01/24 1420  04/02/24 0954 04/03/24 0506  NA 137 138 138  K 5.0 4.3 4.0  CL 99 100 99  CO2 30 30 32  GLUCOSE 188* 224* 146*  BUN 31* 28* 26*  CREATININE 1.36* 1.21 1.18  CALCIUM 8.8* 8.9 9.2   GFR: Estimated Creatinine Clearance: 54 mL/min (by C-G formula based on SCr of 1.18 mg/dL). Liver Function Tests: Recent Labs  Lab 04/01/24 1420  AST 19  ALT <5  ALKPHOS 194*  BILITOT 0.2  PROT 7.0  ALBUMIN 3.3*   CBG: Recent Labs  Lab 04/03/24 1309 04/03/24 1717 04/03/24 2057 04/04/24 0936 04/04/24 1150  GLUCAP 162* 288* 224* 197* 204*    Recent Results (from the past 240 hours)  Resp panel by RT-PCR (RSV, Flu A&B, Covid) Anterior Nasal Swab     Status: None   Collection Time: 04/01/24  4:39 PM   Specimen: Anterior Nasal Swab  Result Value Ref Range Status   SARS Coronavirus 2 by RT PCR NEGATIVE NEGATIVE Final    Comment: (NOTE) SARS-CoV-2 target nucleic acids are NOT DETECTED.  The SARS-CoV-2 RNA is generally detectable in  upper respiratory specimens during the acute phase of infection. The lowest concentration of SARS-CoV-2 viral copies this assay can detect is 138 copies/mL. A negative result does not preclude SARS-Cov-2 infection and should not be used as the sole basis for treatment or other patient management decisions. A negative result may occur with  improper specimen collection/handling, submission of specimen other than nasopharyngeal swab, presence of viral mutation(s) within the areas targeted by this assay, and inadequate number of viral copies(<138 copies/mL). A negative result must be combined with clinical observations, patient history, and epidemiological information. The expected result is Negative.  Fact Sheet for Patients:  bloggercourse.com  Fact Sheet for Healthcare Providers:  seriousbroker.it  This test is no t yet approved or cleared by the [place]  FDA and  has been authorized for detection  and/or diagnosis of SARS-CoV-2 by FDA under an Emergency Use Authorization (EUA). This EUA will remain  in effect (meaning this test can be used) for the duration of the COVID-19 declaration under Section 564(b)(1) of the Act, 21 U.S.C.section 360bbb-3(b)(1), unless the authorization is terminated  or revoked sooner.       Influenza A by PCR NEGATIVE NEGATIVE Final   Influenza B by PCR NEGATIVE NEGATIVE Final    Comment: (NOTE) The Xpert Xpress SARS-CoV-2/FLU/RSV plus assay is intended as an aid in the diagnosis of influenza from Nasopharyngeal swab specimens and should not be used as a sole basis for treatment. Nasal washings and aspirates are unacceptable for Xpert Xpress SARS-CoV-2/FLU/RSV testing.  Fact Sheet for Patients: bloggercourse.com  Fact Sheet for Healthcare Providers: seriousbroker.it  This test is not yet approved or cleared by the [place]  FDA and has been authorized for detection and/or diagnosis of SARS-CoV-2 by FDA under an Emergency Use Authorization (EUA). This EUA will remain in effect (meaning this test can be used) for the duration of the COVID-19 declaration under Section 564(b)(1) of the Act, 21 U.S.C. section 360bbb-3(b)(1), unless the authorization is terminated or revoked.     Resp Syncytial Virus by PCR NEGATIVE NEGATIVE Final    Comment: (NOTE) Fact Sheet for Patients: bloggercourse.com  Fact Sheet for Healthcare Providers: seriousbroker.it  This test is not yet approved or cleared by the [place]  FDA and has been authorized for detection and/or diagnosis of SARS-CoV-2 by FDA under an Emergency Use Authorization (EUA). This EUA will remain in effect (meaning this test can be used) for the duration of the COVID-19 declaration under Section 564(b)(1) of the Act, 21 U.S.C. section 360bbb-3(b)(1), unless the authorization is terminated  or revoked.  Performed at [facility], [address], [place], [place] [address]      Radiology Studies: No results found.   Scheduled Meds:  apixaban   5 mg Oral BID   aspirin  EC  81 mg Oral Daily   benazepril   10 mg Oral Daily   brimonidine   1 drop Both Eyes TID   buPROPion   300 mg Oral Daily   carvedilol   3.125 mg Oral BID WC   doxycycline   100 mg Oral Q12H   escitalopram   20 mg Oral Daily   finasteride   5 mg Oral Daily   furosemide   20 mg Oral Daily   gabapentin   200 mg Oral BID   Gerhardt's butt cream   Topical BID   guaiFENesin   600 mg Oral BID   insulin  aspart  0-15 Units Subcutaneous TID WC   insulin  aspart  0-5 Units Subcutaneous QHS   insulin  glargine  20 Units Subcutaneous Daily  ipratropium  0.5 mg Nebulization BID   magnesium  oxide  400 mg Oral Daily   pantoprazole   40 mg Oral Daily   pravastatin   40 mg Oral Daily   spironolactone   12.5 mg Oral Daily   tamsulosin   0.4 mg Oral Daily   Continuous Infusions:  ampicillin -sulbactam (UNASYN ) IV 3 g (04/04/24 1227)     LOS: 3 days  MDM: Patient is high risk for one or more organ failure.  They necessitate ongoing hospitalization for continued IV therapies and subsequent lab monitoring. Total time spent interpreting labs and vitals, reviewing the medical record, coordinating care amongst consultants and care team members, directly assessing and discussing care with the patient and/or family: 55 min Laree Lock, MD [facility]  To contact the attending physician between 7A-7P please use Epic Chat. To contact the covering physician during after hours 7P-7A, please review Amion.  04/04/2024, 4:24 PM   *This document has been created with the assistance of dictation software. Please excuse typographical errors. *   "

## 2024-04-05 ENCOUNTER — Inpatient Hospital Stay

## 2024-04-05 DIAGNOSIS — J690 Pneumonitis due to inhalation of food and vomit: Secondary | ICD-10-CM | POA: Diagnosis not present

## 2024-04-05 LAB — BASIC METABOLIC PANEL WITH GFR
Anion gap: 6 (ref 5–15)
BUN: 28 mg/dL — ABNORMAL HIGH (ref 8–23)
CO2: 33 mmol/L — ABNORMAL HIGH (ref 22–32)
Calcium: 9.1 mg/dL (ref 8.9–10.3)
Chloride: 99 mmol/L (ref 98–111)
Creatinine, Ser: 1.36 mg/dL — ABNORMAL HIGH (ref 0.61–1.24)
GFR, Estimated: 52 mL/min — ABNORMAL LOW
Glucose, Bld: 225 mg/dL — ABNORMAL HIGH (ref 70–99)
Potassium: 4.4 mmol/L (ref 3.5–5.1)
Sodium: 138 mmol/L (ref 135–145)

## 2024-04-05 LAB — GLUCOSE, CAPILLARY
Glucose-Capillary: 176 mg/dL — ABNORMAL HIGH (ref 70–99)
Glucose-Capillary: 212 mg/dL — ABNORMAL HIGH (ref 70–99)
Glucose-Capillary: 197 mg/dL — ABNORMAL HIGH (ref 70–99)
Glucose-Capillary: 184 mg/dL — ABNORMAL HIGH (ref 70–99)

## 2024-04-05 LAB — CBC
HCT: 30.3 % — ABNORMAL LOW (ref 39.0–52.0)
Hemoglobin: 8.9 g/dL — ABNORMAL LOW (ref 13.0–17.0)
MCH: 28.5 pg (ref 26.0–34.0)
MCHC: 29.4 g/dL — ABNORMAL LOW (ref 30.0–36.0)
MCV: 97.1 fL (ref 80.0–100.0)
Platelets: 252 10*3/uL (ref 150–400)
RBC: 3.12 MIL/uL — ABNORMAL LOW (ref 4.22–5.81)
RDW: 15.9 % — ABNORMAL HIGH (ref 11.5–15.5)
WBC: 8.2 10*3/uL (ref 4.0–10.5)
nRBC: 0 % (ref 0.0–0.2)

## 2024-04-05 MED ORDER — INSULIN GLARGINE 100 UNIT/ML ~~LOC~~ SOLN
25.0000 [IU] | Freq: Every day | SUBCUTANEOUS | Status: DC
  Administered 2024-04-05 – 2024-04-06 (×2): 25 [IU] via SUBCUTANEOUS
  Filled 2024-04-05 (×3): qty 0.25

## 2024-04-05 MED ORDER — SODIUM CHLORIDE 0.9 % IV SOLN
3.0000 g | Freq: Four times a day (QID) | INTRAVENOUS | Status: DC
  Administered 2024-04-05 – 2024-04-06 (×6): 3 g via INTRAVENOUS
  Filled 2024-04-05 (×7): qty 8

## 2024-04-05 MED ORDER — AMOXICILLIN-POT CLAVULANATE 875-125 MG PO TABS: 1.0000 | ORAL_TABLET | Freq: Two times a day (BID) | ORAL | Status: DC

## 2024-04-05 NOTE — Plan of Care (Signed)

## 2024-04-05 NOTE — Evaluation (Signed)
 Occupational Therapy Evaluation Patient Details Name: Derek Andersen MRN: [id number] DOB: 1940-09-13 Today's Date: 04/05/2024   History of Present Illness   Derek Andersen is a 84 y.o. 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 Patient was on BiPAP and severe respiratory distress on presentation to ER.     Clinical Impressions Patient presenting with decreased Ind in self care,balance, functional mobility, transfers, endurance, and safety awareness. Patient's caregiver present during session who reports he has been mostly bed bound for the last month. He was able to transfer to wheelchair with assistance. Currently, ADLs from bed level with total A but was able to get into shower ~ 1-2 months ago. Patient currently functioning at total A for bed mobility this session. They are interested in universal cuff for self feeding. Will try next session if able. Patient will benefit from acute OT to increase overall independence in the areas of ADLs, functional mobility, and safety awareness in order to safely discharge.     If plan is discharge home, recommend the following:   Two people to help with walking and/or transfers;A lot of help with bathing/dressing/bathroom;Assistance with cooking/housework;Direct supervision/assist for medications management;Direct supervision/assist for financial management;Help with stairs or ramp for entrance     Functional Status Assessment   Patient has had a recent decline in their functional status and demonstrates the ability to make significant improvements in function in a reasonable and predictable amount of time.     Equipment Recommendations   [employer];Hospital bed      Precautions/Restrictions    Precautions Precautions: Fall Recall of Precautions/Restrictions: Intact Precaution/Restrictions Comments: HOB above 30 degrees     Mobility Bed Mobility Overal bed mobility: Needs Assistance Bed Mobility: Rolling Rolling: Total assist              Transfers                              ADL either performed or assessed with clinical judgement   ADL Overall ADL's : Needs assistance/impaired                                       General ADL Comments: anticipate pt will need max-total A for self care tasks from bed level     Vision Patient Visual Report: No change from baseline              Pertinent Vitals/Pain Pain Assessment Pain Assessment: Faces Faces Pain Scale: Hurts little more Pain Location: chronic back pain Pain Descriptors / Indicators: Discomfort Pain Intervention(s): Monitored during session, Repositioned     Extremity/Trunk Assessment Upper Extremity Assessment Upper Extremity Assessment: Generalized weakness   Lower Extremity Assessment Lower Extremity Assessment: Generalized weakness   Cervical / Trunk Assessment Cervical / Trunk Assessment: Neck Surgery;Back Surgery   Communication Communication Communication: No apparent difficulties   Cognition Arousal: Alert Behavior During Therapy: WFL for tasks assessed/performed Cognition: No apparent impairments                               Following commands: Intact  Cueing  General Comments   Cueing Techniques: Verbal cues              Home Living Family/patient expects to be discharged to:: Private residence Living Arrangements: Alone Available Help at Discharge: Personal care attendant;Available PRN/intermittently Type of Home: House Home Access: Ramped entrance     Home Layout: One level     Bathroom Shower/Tub: Producer, Television/film/video: Handicapped height Bathroom Accessibility: Yes How Accessible: Accessible  via walker Home Equipment: Rolling Walker (2 wheels);Lift chair   Additional Comments: caregivers 12 hours a day to assist with all tasks with hours being broken up throughout the day.      Prior Functioning/Environment Prior Level of Function : Needs assist       Physical Assist : Mobility (physical);ADLs (physical) Mobility (physical): Bed mobility;Transfers ADLs (physical): Bathing;Dressing;Toileting   ADLs Comments: Caregiver assist for self care and toileting from bed level. However, caregiver reports he was getting into shower in December with assistance for transfer. Pt self feeds with built up handles on utensils but having some difficulty with that recently as well.    OT Problem List: Decreased strength;Decreased activity tolerance;Impaired balance (sitting and/or standing)   OT Treatment/Interventions: Self-care/ADL training;Therapeutic exercise;Neuromuscular education;Energy conservation;DME and/or AE instruction;Therapeutic activities;Patient/family education;Balance training      OT Goals(Current goals can be found in the care plan section)   Acute Rehab OT Goals Patient Stated Goal: to go home OT Goal Formulation: With patient Time For Goal Achievement: 04/19/24 Potential to Achieve Goals: Fair ADL Goals Pt Will Perform Eating: with adaptive utensils;with supervision Pt Will Perform Grooming: with set-up Pt Will Transfer to Toilet: with mod assist;bedside commode;stand pivot transfer   OT Frequency:  Min 2X/week    Co-evaluation PT/OT/SLP Co-Evaluation/Treatment: Yes            AM-PAC OT 6 Clicks Daily Activity     Outcome Measure Help from another person eating meals?: A Lot Help from another person taking care of personal grooming?: A Lot Help from another person toileting, which includes using toliet, bedpan, or urinal?: Total Help from another person bathing (including washing, rinsing, drying)?: A Lot Help from another person to put on and  taking off regular upper body clothing?: A Lot Help from another person to put on and taking off regular lower body clothing?: Total 6 Click Score: 10   End of Session Nurse Communication: Mobility status  Activity Tolerance: Patient limited by fatigue;Patient tolerated treatment well Patient left: in bed;with call bell/phone within reach;with family/visitor present  OT Visit Diagnosis: Unsteadiness on feet (R26.81);Muscle weakness (generalized) (M62.81);Other abnormalities of gait and mobility (R26.89)                Time: 9059-9046 OT Time Calculation (min): 13 min Charges:  OT General Charges $OT Visit: 1 Visit OT Evaluation $OT Eval Moderate Complexity: 1 [address] [place], OTR/L , CBIS ascom [phone]  04/05/24, 11:51 AM

## 2024-04-05 NOTE — Evaluation (Signed)
 Physical Therapy Evaluation Patient Details Name: Derek Andersen MRN: [id number] DOB: 1940-06-28 Today's Date: 04/05/2024  History of Present Illness  Derek Andersen is a 84 y.o. 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 Patient was on BiPAP and severe respiratory distress on presentation to ER.  Clinical Impression  Patient noted to be in supine position at PT arrival in room, for an initial PT evaluation due to a decline in functional status, with baseline mobility reported as needing assistance mostly bed bound and transfers, and currently requiring needing for mod/maxA x 2. The patient is A&O x 4, presenting with good willingness to work with PT. The patient resides in a house and lives with caregiver with family/friend support. There is level entry to enter. The overall clinical impression is that the patient presents with moderate mobility limitations. Recommended skilled PT will address safety, mobility, and discharge planning.        If plan is discharge home, recommend the following: A lot of help with walking and/or transfers;A lot of help with bathing/dressing/bathroom   Can travel by private vehicle   No    Equipment Recommendations None recommended by PT  Recommendations for Other Services       Functional Status Assessment Patient has had a recent decline in their functional status and demonstrates the ability to make significant improvements in function in a reasonable and predictable amount of time.     Precautions / Restrictions Precautions Precautions: Fall Recall of Precautions/Restrictions: Intact Precaution/Restrictions Comments: HOB above 30 degrees Restrictions Weight Bearing Restrictions Per Provider Order: No       Mobility  Bed Mobility Overal bed mobility: Needs Assistance Bed Mobility: Supine to Sit, Sit to Supine     Supine to sit: +2 for physical assistance, HOB elevated, Max assist Sit to supine: +2 for physical assistance, Max assist   General bed mobility comments: patient very stiff, requires assistance to get seated edge of bed, pt very fatigued not able to sitt EOB >3 min. without SOB; limited ability to grasp bed railing; R lateral lean and required LE assistance to get to EOB; caregiver in room provided very helpful +2 assistance    Transfers                   General transfer comment: standing deferred due to trunk and lower extremity weakness noted during bed mobility    Ambulation/Gait                  Stairs            Wheelchair Mobility     Tilt Bed    Modified Rankin (Stroke Patients Only)       Balance Overall balance assessment: Needs assistance Sitting-balance support: Feet supported Sitting balance-Leahy Scale: Poor Sitting balance - Comments: poor sitting balance regressed to zero after >3 min.   Standing balance support: Bilateral upper extremity supported, During functional activity, Reliant on assistive device for balance Standing balance-Leahy Scale: Zero                               Pertinent Vitals/Pain Pain Assessment Pain Assessment: Faces Pain Score: 8  Faces Pain Scale: Hurts even more Pain Location:  back Pain Descriptors / Indicators: Discomfort Pain Intervention(s): Monitored during session, Limited activity within patient's tolerance    Home Living Family/patient expects to be discharged to:: Private residence Living Arrangements: Alone Available Help at Discharge: Personal care attendant;Available PRN/intermittently Type of Home: House Home Access: Ramped entrance       Home Layout: One level Home Equipment: Agricultural Consultant (2 wheels);Lift chair Additional Comments: caregivers 12 hours a day  to assist with all tasks    Prior Function Prior Level of Function : Needs assist       Physical Assist : Mobility (physical);ADLs (physical) Mobility (physical): Bed mobility;Transfers ADLs (physical): Bathing;Dressing;Toileting   ADLs Comments: Caregiver assistance     Extremity/Trunk Assessment   Upper Extremity Assessment Upper Extremity Assessment: Generalized weakness    Lower Extremity Assessment Lower Extremity Assessment: Generalized weakness    Cervical / Trunk Assessment Cervical / Trunk Assessment: Neck Surgery;Back Surgery  Communication   Communication Communication: No apparent difficulties    Cognition Arousal: Alert Behavior During Therapy: WFL for tasks assessed/performed   PT - Cognitive impairments: No apparent impairments                         Following commands: Intact       Cueing Cueing Techniques: Verbal cues     General Comments      Exercises     Assessment/Plan    PT Assessment Patient needs continued PT services  PT Problem List Decreased strength;Decreased activity tolerance;Decreased balance;Decreased mobility;Cardiopulmonary status limiting activity       PT Treatment Interventions DME instruction;Functional mobility training;Therapeutic activities;Therapeutic exercise;Balance training;Neuromuscular re-education;Patient/family education    PT Goals (Current goals can be found in the Care Plan section)  Acute Rehab PT Goals Patient Stated Goal: return home PT Goal Formulation: With patient/family Time For Goal Achievement: 04/26/24 Potential to Achieve Goals: Fair    Frequency Min 2X/week     Co-evaluation               AM-PAC PT 6 Clicks Mobility  Outcome Measure Help needed turning from your back to your side while in a flat bed without using bedrails?: A Lot Help needed moving from lying on your back to sitting on the side of a flat bed without using bedrails?: A Lot Help needed moving to  and from a bed to a chair (including a wheelchair)?: A Lot Help needed standing up from a chair using your arms (e.g., wheelchair or bedside chair)?: A Lot Help needed to walk in hospital room?: Total Help needed climbing 3-5 steps with a railing? : Total 6 Click Score: 10    End of Session Equipment Utilized During Treatment: Oxygen Activity Tolerance: Patient limited by fatigue Patient left: in bed;with call bell/phone within reach;with family/visitor present;with bed alarm set Nurse Communication: Mobility status PT Visit Diagnosis: Other abnormalities of gait and mobility (R26.89);Muscle weakness (generalized) (M62.81);Unsteadiness on feet (R26.81)    Time: 9191-9173 PT Time Calculation (min) (ACUTE ONLY): 18 min   Charges:   PT Evaluation $PT Eval Low Complexity: 1 Low   PT General Charges $$ ACUTE PT VISIT: 1 Visit         Sherlean Lesches DPT, PT    Sherlean A Jaksen Fiorella 04/05/2024, 9:32 AM

## 2024-04-05 NOTE — TOC Progression Note (Addendum)
 Transition of Care [facility]) - Progression Note    Patient Details  Name: Derek Andersen MRN: [id number] Date of Birth: January 25, 1941  [facility]) CM/SW Contact  Lauraine JAYSON Carpen, LCSW Phone Number: 04/05/2024, 11:39 AM  Clinical Narrative:  This CSW is working remote today. Called patient in the room but he was on the phone so caregiver answered. She stated patient is not interested in SNF placement at this time. Will need to get that confirmation from patient since he is AOx4. CSW tried calling him again but he did not answer. Will try again later.   12:35 pm: CSW spoke to patient and confirmed he is not interested in SNF placement. DME recommendations for a hoyer lift and hospital bed. He said he already has a hoyer lift and he also has an adjustable bed which he prefers to use over a hospital bed. Will try to get him qualified for ambulance transport home at discharge. Address on facesheet is correct. CSW notified MD this morning that [facility] is not transporting patients home today.  Expected Discharge Plan: Home w Home Health Services Barriers to Discharge: Continued Medical Work up               Expected Discharge Plan and Services       Living arrangements for the past 2 months: Single Family Home                           HH Arranged: RN, PT, OT [facility] Agency: [facility] Date [facility] Agency Contacted: 04/02/24   Representative spoke with at [facility] Agency: [place]   Social Drivers of Health (SDOH) Interventions SDOH Screenings   Food Insecurity: No Food Insecurity (04/02/2024)  Housing: Low Risk (04/02/2024)  Transportation Needs: No Transportation Needs (04/02/2024)  Utilities: Not At Risk (04/02/2024)  Financial Resource Strain: Low Risk  (07/14/2023)   Received from [facility]  Social Connections: Socially Isolated (04/02/2024)  Stress: Stress Concern Present (08/13/2023)   Received from [facility]  Tobacco Use: Low  Risk (04/01/2024)    Readmission Risk Interventions    04/02/2024    3:04 PM  Readmission Risk Prevention Plan  PCP or Specialist Appt within 3-5 Days Complete  HRI or Home Care Consult Complete  Social Work Consult for Recovery Care Planning/Counseling Complete  Palliative Care Screening Not Applicable

## 2024-04-05 NOTE — Progress Notes (Signed)
 " PROGRESS NOTE    Derek Andersen  FMW:[id number] DOB: [birth year] DOA: 04/01/2024 PCP: Diedra Lame, MD  Chief Complaint  Patient presents with   Respiratory Distress    Hospital Course:  Derek Andersen is a 84 y.o. 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 Patient was on BiPAP and severe respiratory distress on presentation to ER.  Daughter provided history per chart review  Patient was recently hospitalized for a pneumonia process and completed antibiotics and discharged home.  At baseline patient need to be fed for his food because chronic bilateral hands weakness secondary to cervical spine problems.  while eating a corn dog patient started to choking which family attributed to  he was fed too fast and family denied any history of aspiration before.  Patient was found to be cyanotic and O2 saturation dropped to below 50 and EMS was called.  EMS arrived and patient was placed on nonrebreather.  However, there was no history of cough or choke after eat or drink Was placed on BiPAP on presentation, improved clinically on 3 L [place] which is baseline.  Continuing IV antibiotics, refused to CT. Hospital course as below   On New Year's Day, patient had some back pain and dysuria went to [facility] ED when he was diagnosed with a kidney infection, while lying down on the MRI machine patient started to have choking which family attributed to  he cannot lie last for his neck problem, lying flat but related to the aspiration event and patient was found to be hypoxic and short course of CPR performed and patient recovered consciousness.  He was subsequently treated with Zosyn  for 10 days for aspiration pneumonia.  Subjective: Patient was examined at the bedside,  daughter Jori present  Reports cough better Will continue IV antibiotics due to continued congestion On mucinex , Hypertonic saline nebs Offered CT chest per daughter request, patient unable to lay flat, canceled Anticipate discharge tomorrow on p.o. antibiotics if stable Called daughter Jori again, provided updates and answered all questions   Objective: Vitals:   04/05/24 1000 04/05/24 1159 04/05/24 1422 04/05/24 1547  BP:  (!) 162/65  (!) 122/100  Pulse:  79  79  Resp:  18    Temp:  98.2 F (36.8 C)    TempSrc:  Oral    SpO2: 97% 98% 94% 98%  Weight:      Height:        Intake/Output Summary (Last 24 hours) at 04/05/2024 1619 Last data filed at 04/05/2024 9193 Gross per 24 hour  Intake 240 ml  Output 600 ml  Net -360 ml   Filed Weights   04/01/24 1412  Weight: 98.7 kg    Examination: Constitutional: NAD, calm, comfortable Neck: normal, supple, no masses, no thyromegaly Respiratory: clear to auscultation bilaterally, no wheezing, bilateral crackles. No accessory muscle use.  Cardiovascular: Regular rate and rhythm, no murmurs / rubs / gallops. No extremity edema. 2+ pedal pulses. No carotid bruits  Abdomen: no tenderness, no masses palpated. No hepatosplenomegaly. Bowel sounds positive Musculoskeletal: no clubbing / cyanosis. No joint deformity upper and lower extremities. Good ROM, no contractures. Normal muscle tone Skin: no rashes, lesions, ulcers. No induration Neurologic: CN 2-12 grossly intact. Sensation intact, DTR normal. Strength 5/5 in all 4.  Psychiatric: Normal judgment and insight. Alert and oriented x 3. Normal mood  Assessment & Plan:  Acute on chronic hypoxic and hypercapnic respiratory failure Aspiration pneumonia, recurrent Was on BiPAP on presentation, weaned down to 3 L [place] which is baseline Suspect patient had episode of choking due to aspiration.  Refused CT due to having episode of unresponsiveness/PEA at [facility], states cannot lay  flat COVID/flu/RSV negative CXR shows RUL/apical airspace process stable, may represent focal infection, chronic left base opacification Continue IV Unasyn , doxycycline  Seen by SLP, aspiration and reflux precautions provided hypertonic saline nebs, Tussionex Incentive spirometry, flutter valve, Chest PT   Chronic HFrEF BNP elevated, mild hypervolemia Elevated troponin likely due to demand ischemia  Home Lasix , spironolactone   CKD stage 3a Monitor Cr   PAF continue Eliquis  and Coreg    IDDM HbA1c 7.0 Inc Lantus  25 units (home dose 30 units) and sliding scale  HTN Home Benazepril  10 mg, Coreg  IV hydralazine  with holding parameters  Normocytic anemia Monitor Hb  Chronic back pain Continue gabapentin    Anxiety/depression Continue Lexapro , Wellbutrin , as needed Xanax   Stage II sacral wound (POA) WOC consult  OSA CPAP at night  Obesity class II Body mass index is 35.12 kg/m. Outpatient follow up for lifestyle modification and risk factor management  --Bedbound at baseline Will resume home services at discharge PT/OT eval   DVT prophylaxis: Eliquis    Code Status: Limited: Do not attempt resuscitation (DNR) -DNR-LIMITED -Do Not Intubate/DNI  Disposition:  Home  Consultants:  None  Procedures:  None  Antimicrobials:  Anti-infectives (From admission, onward)    Start     Dose/Rate Route Frequency Ordered Stop   04/05/24 1200  amoxicillin -clavulanate (AUGMENTIN ) 875-125 MG per tablet 1 tablet  Status:  Discontinued        1 tablet Oral Every 12 hours 04/05/24 1108 04/05/24 1141   04/05/24 1200  Ampicillin -Sulbactam (UNASYN ) 3 g in sodium chloride  0.9 % 100 mL IVPB        3 g 200 mL/hr over 30 Minutes Intravenous Every 6 hours 04/05/24 1141     04/04/24 1330  doxycycline  (VIBRA -TABS) tablet 100 mg        100 mg Oral Every 12 hours 04/04/24 1235 04/09/24 0959   04/01/24 1830  Ampicillin -Sulbactam (UNASYN ) 3 g in sodium chloride  0.9 % 100 mL IVPB  Status:   Discontinued        3 g 200 mL/hr over 30 Minutes Intravenous Every 6 hours 04/01/24 1800 04/05/24 1108       Data Reviewed: I have personally reviewed following labs and imaging studies CBC: Recent Labs  Lab 04/01/24 1420 04/02/24 0415 04/02/24 0954 04/03/24 0506 04/05/24 1156  WBC 7.1 8.1 8.5 7.2 8.2  HGB 9.6* 8.7* 8.6* 8.9* 8.9*  HCT 31.5* 28.3* 28.1* 29.0* 30.3*  MCV 96.3 95.0 95.6 94.2 97.1  PLT 375 303 295 300 252   Basic Metabolic Panel: Recent Labs  Lab 04/01/24 1420 04/02/24 0954 04/03/24 0506 04/05/24 1156  NA 137 138 138 138  K 5.0 4.3 4.0 4.4  CL 99 100 99 99  CO2 30 30 32 33*  GLUCOSE 188* 224* 146* 225*  BUN 31* 28* 26* 28*  CREATININE 1.36* 1.21 1.18 1.36*  CALCIUM 8.8* 8.9 9.2 9.1   GFR: Estimated Creatinine Clearance: 46.9 mL/min (A) (by C-G formula based on SCr of 1.36 mg/dL (H)). Liver Function Tests: Recent Labs  Lab 04/01/24 1420  AST 19  ALT <5  ALKPHOS 194*  BILITOT 0.2  PROT 7.0  ALBUMIN 3.3*   CBG: Recent Labs  Lab 04/04/24 1150 04/04/24 1701 04/04/24 2051 04/05/24 0803 04/05/24 1201  GLUCAP 204* 154* 172* 176* 212*    Recent Results (from the past 240 hours)  Resp panel by RT-PCR (RSV, Flu A&B, Covid) Anterior Nasal Swab     Status: None   Collection Time: 04/01/24  4:39 PM   Specimen: Anterior Nasal Swab  Result Value Ref Range Status   SARS Coronavirus 2 by RT PCR NEGATIVE NEGATIVE Final    Comment: (NOTE) SARS-CoV-2 target nucleic acids are NOT DETECTED.  The SARS-CoV-2 RNA is generally detectable in upper respiratory specimens during the acute phase of infection. The lowest concentration of SARS-CoV-2 viral copies this assay can detect is 138 copies/mL. A negative result does not preclude SARS-Cov-2 infection and should not be used as the sole basis for treatment or other patient management decisions. A negative result may occur with  improper specimen collection/handling, submission of specimen other than  nasopharyngeal swab, presence of viral mutation(s) within the areas targeted by this assay, and inadequate number of viral copies(<138 copies/mL). A negative result must be combined with clinical observations, patient history, and epidemiological information. The expected result is Negative.  Fact Sheet for Patients:  bloggercourse.com  Fact Sheet for Healthcare Providers:  seriousbroker.it  This test is no t yet approved or cleared by the [place]  FDA and  has been authorized for detection and/or diagnosis of SARS-CoV-2 by FDA under an Emergency Use Authorization (EUA). This EUA will remain  in effect (meaning this test can be used) for the duration of the COVID-19 declaration under Section 564(b)(1) of the Act, 21 U.S.C.section 360bbb-3(b)(1), unless the authorization is terminated  or revoked sooner.       Influenza A by PCR NEGATIVE NEGATIVE Final   Influenza B by PCR NEGATIVE NEGATIVE Final    Comment: (NOTE) The Xpert Xpress SARS-CoV-2/FLU/RSV plus assay is intended as an aid in the diagnosis of influenza from Nasopharyngeal swab specimens and should not be used as a sole basis for treatment. Nasal washings and aspirates are unacceptable for Xpert Xpress SARS-CoV-2/FLU/RSV testing.  Fact Sheet for Patients: bloggercourse.com  Fact Sheet for Healthcare Providers: seriousbroker.it  This test is not yet approved or cleared by the [place]  FDA and has been authorized for detection and/or diagnosis of SARS-CoV-2 by FDA under an Emergency Use Authorization (EUA). This EUA will remain in effect (meaning this test can be used) for the duration of the COVID-19 declaration under Section 564(b)(1) of the Act, 21 U.S.C. section 360bbb-3(b)(1), unless the authorization is terminated or revoked.     Resp Syncytial Virus by PCR NEGATIVE NEGATIVE Final    Comment:  (NOTE) Fact Sheet for Patients: bloggercourse.com  Fact Sheet for Healthcare Providers: seriousbroker.it  This test is not yet approved or cleared by the [place]  FDA and has been authorized for detection and/or diagnosis of SARS-CoV-2 by FDA under an Emergency Use Authorization (EUA). This EUA will remain in effect (meaning this test can be used) for the duration of the COVID-19 declaration under Section 564(b)(1) of the Act, 21 U.S.C. section 360bbb-3(b)(1), unless the authorization is terminated or revoked.  Performed at [facility], [address] [place], [place] [address]      Radiology Studies: No results found.   Scheduled Meds:  apixaban   5 mg Oral BID   aspirin  EC  81 mg Oral Daily   benazepril   10 mg Oral Daily   brimonidine   1 drop Both  Eyes TID   buPROPion   300 mg Oral Daily   carvedilol   3.125 mg Oral BID WC   doxycycline   100 mg Oral Q12H   escitalopram   20 mg Oral Daily   finasteride   5 mg Oral Daily   furosemide   20 mg Oral Daily   gabapentin   200 mg Oral BID   Gerhardt's butt cream   Topical BID   guaiFENesin   600 mg Oral BID   insulin  aspart  0-15 Units Subcutaneous TID WC   insulin  aspart  0-5 Units Subcutaneous QHS   insulin  glargine  25 Units Subcutaneous Daily   ipratropium  0.5 mg Nebulization BID   magnesium  oxide  400 mg Oral Daily   pantoprazole   40 mg Oral Daily   pravastatin   40 mg Oral Daily   spironolactone   12.5 mg Oral Daily   tamsulosin   0.4 mg Oral Daily   Continuous Infusions:  ampicillin -sulbactam (UNASYN ) IV 3 g (04/05/24 1309)     LOS: 4 days  MDM: Patient is high risk for one or more organ failure.  They necessitate ongoing hospitalization for continued IV therapies and subsequent lab monitoring. Total time spent interpreting labs and vitals, reviewing the medical record, coordinating care amongst consultants and care team members, directly assessing and  discussing care with the patient and/or family: 55 min Laree Lock, MD [facility]  To contact the attending physician between 7A-7P please use Epic Chat. To contact the covering physician during after hours 7P-7A, please review Amion.  04/05/2024, 4:19 PM   *This document has been created with the assistance of dictation software. Please excuse typographical errors. *   "

## 2024-04-06 ENCOUNTER — Inpatient Hospital Stay

## 2024-04-06 DIAGNOSIS — J690 Pneumonitis due to inhalation of food and vomit: Secondary | ICD-10-CM | POA: Diagnosis not present

## 2024-04-06 LAB — COMPREHENSIVE METABOLIC PANEL WITH GFR
ALT: 8 U/L (ref 0–44)
AST: 12 U/L — ABNORMAL LOW (ref 15–41)
Albumin: 2.9 g/dL — ABNORMAL LOW (ref 3.5–5.0)
Alkaline Phosphatase: 131 U/L — ABNORMAL HIGH (ref 38–126)
Anion gap: 7 (ref 5–15)
BUN: 25 mg/dL — ABNORMAL HIGH (ref 8–23)
CO2: 33 mmol/L — ABNORMAL HIGH (ref 22–32)
Calcium: 8.5 mg/dL — ABNORMAL LOW (ref 8.9–10.3)
Chloride: 97 mmol/L — ABNORMAL LOW (ref 98–111)
Creatinine, Ser: 1.2 mg/dL (ref 0.61–1.24)
GFR, Estimated: >60 mL/min
Glucose, Bld: 112 mg/dL — ABNORMAL HIGH (ref 70–99)
Potassium: 4.5 mmol/L (ref 3.5–5.1)
Sodium: 137 mmol/L (ref 135–145)
Total Bilirubin: 0.2 mg/dL (ref 0.0–1.2)
Total Protein: 6 g/dL — ABNORMAL LOW (ref 6.5–8.1)

## 2024-04-06 LAB — BASIC METABOLIC PANEL WITH GFR
Anion gap: 7 (ref 5–15)
BUN: 24 mg/dL — ABNORMAL HIGH (ref 8–23)
CO2: 32 mmol/L (ref 22–32)
Calcium: 8.6 mg/dL — ABNORMAL LOW (ref 8.9–10.3)
Chloride: 96 mmol/L — ABNORMAL LOW (ref 98–111)
Creatinine, Ser: 1.15 mg/dL (ref 0.61–1.24)
GFR, Estimated: >60 mL/min
Glucose, Bld: 273 mg/dL — ABNORMAL HIGH (ref 70–99)
Potassium: 4.5 mmol/L (ref 3.5–5.1)
Sodium: 135 mmol/L (ref 135–145)

## 2024-04-06 LAB — GLUCOSE, CAPILLARY
Glucose-Capillary: 137 mg/dL — ABNORMAL HIGH (ref 70–99)
Glucose-Capillary: 265 mg/dL — ABNORMAL HIGH (ref 70–99)
Glucose-Capillary: 222 mg/dL — ABNORMAL HIGH (ref 70–99)
Glucose-Capillary: 142 mg/dL — ABNORMAL HIGH (ref 70–99)
Glucose-Capillary: 74 mg/dL (ref 70–99)
Glucose-Capillary: 109 mg/dL — ABNORMAL HIGH (ref 70–99)

## 2024-04-06 LAB — BLOOD GAS, VENOUS: Patient temperature: 37

## 2024-04-06 LAB — PRO BRAIN NATRIURETIC PEPTIDE: Pro Brain Natriuretic Peptide: 1561 pg/mL — ABNORMAL HIGH

## 2024-04-06 LAB — LACTIC ACID, PLASMA: Lactic Acid, Venous: 1.2 mmol/L (ref 0.5–1.9)

## 2024-04-06 MED ORDER — DEXTROSE 50 % IV SOLN: 50.0000 mL | INTRAVENOUS | Status: DC | PRN

## 2024-04-06 MED ORDER — VANCOMYCIN HCL 2000 MG/400ML IV SOLN
2000.0000 mg | Freq: Once | INTRAVENOUS | Status: DC
  Filled 2024-04-06: qty 400

## 2024-04-06 MED ORDER — SODIUM CHLORIDE 0.9 % IV BOLUS
250.0000 mL | Freq: Once | INTRAVENOUS | Status: AC
  Administered 2024-04-06: 250 mL via INTRAVENOUS

## 2024-04-06 MED ORDER — SODIUM CHLORIDE 3 % IN NEBU: 4.0000 mL | INHALATION_SOLUTION | RESPIRATORY_TRACT | Status: DC | PRN

## 2024-04-06 MED ORDER — DEXTROSE 50 % IV SOLN
25.0000 mL | INTRAVENOUS | Status: DC | PRN
  Administered 2024-04-07: 25 mL via INTRAVENOUS
  Filled 2024-04-06: qty 50

## 2024-04-06 MED ORDER — PIPERACILLIN-TAZOBACTAM 3.375 G IVPB
3.3750 g | Freq: Three times a day (TID) | INTRAVENOUS | Status: DC
  Administered 2024-04-06 – 2024-04-09 (×8): 3.375 g via INTRAVENOUS
  Filled 2024-04-06 (×8): qty 50

## 2024-04-06 MED ORDER — MIDODRINE HCL 5 MG PO TABS
5.0000 mg | ORAL_TABLET | Freq: Once | ORAL | Status: AC
  Administered 2024-04-06: 5 mg via ORAL
  Filled 2024-04-06: qty 1

## 2024-04-06 MED ORDER — FUROSEMIDE 10 MG/ML IJ SOLN
20.0000 mg | Freq: Once | INTRAMUSCULAR | Status: AC
  Administered 2024-04-06: 20 mg via INTRAVENOUS
  Filled 2024-04-06: qty 2

## 2024-04-06 MED ORDER — PIPERACILLIN-TAZOBACTAM 3.375 G IVPB: 3.3750 g | Freq: Three times a day (TID) | INTRAVENOUS | Status: DC

## 2024-04-06 MED ORDER — FUROSEMIDE 10 MG/ML IJ SOLN: 40.0000 mg | Freq: Once | INTRAMUSCULAR | Status: DC

## 2024-04-06 MED ORDER — VANCOMYCIN HCL 1500 MG/300ML IV SOLN
1500.0000 mg | INTRAVENOUS | Status: DC
  Filled 2024-04-06: qty 300

## 2024-04-06 NOTE — Progress Notes (Addendum)
 " PROGRESS NOTE    Derek Andersen  FMW:[id number] DOB: 1940-04-22 DOA: 04/01/2024 PCP: Diedra Lame, MD  Chief Complaint  Patient presents with   Respiratory Distress    Hospital Course:  Derek Andersen is a 84 y.o. 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 Patient was on BiPAP and severe respiratory distress on presentation to ER.  Daughter provided history per chart review  Patient was recently hospitalized for a pneumonia process and completed antibiotics and discharged home.  At baseline patient need to be fed for his food because chronic bilateral hands weakness secondary to cervical spine problems.  while eating a corn dog patient started to choking which family attributed to  he was fed too fast and family denied any history of aspiration before.  Patient was found to be cyanotic and O2 saturation dropped to below 50 and EMS was called.  EMS arrived and patient was placed on nonrebreather.  However, there was no history of cough or choke after eat or drink Was placed on BiPAP on presentation, improved clinically on 3 L [place] which is baseline.  Continuing IV antibiotics, refused to CT. Hospital course as below   On New Year's Day, patient had some back pain and dysuria went to [facility] ED when he was diagnosed with a kidney infection, while lying down on the MRI machine patient started to have choking which family attributed to  he cannot lie last for his neck problem, lying flat but related to the aspiration event and patient was found to be hypoxic and short course of CPR performed and patient recovered consciousness.  He was subsequently treated with Zosyn  for 10 days for aspiration pneumonia.  Subjective: Patient was examined at the bedside,  caretaker Andrea present Reports having difficulty breathing VBG with elevated pCO2, placed on BiPAP Will change to broad-spectrum antibiotic and will give dose of IV Lasix    Objective: Vitals:   04/06/24 0648 04/06/24 0732 04/06/24 1208 04/06/24 1447  BP:  (!) 151/62 (!) 158/53   Pulse:  79 70   Resp:  18 17 (!) 24  Temp:  97.8 F (36.6 C) 98 F (36.7 C)   TempSrc:      SpO2: 98% 91% 98%   Weight:      Height:        Intake/Output Summary (Last 24 hours) at 04/06/2024 1807 Last data filed at 04/06/2024 1208 Gross per 24 hour  Intake --  Output 2100 ml  Net -2100 ml   Filed Weights   04/01/24 1412  Weight: 98.7 kg    Examination: Constitutional: Reports having difficulty breathing Neck: normal, supple, no masses, no thyromegaly Respiratory: Bibasilar crackles, rhonchi present.  No wheezing, no accessory muscle use.  Cardiovascular: Regular rate and rhythm, no murmurs / rubs / gallops. No extremity edema. 2+ pedal pulses. No carotid bruits  Abdomen: no tenderness, no masses palpated. No hepatosplenomegaly. Bowel sounds positive Musculoskeletal: no clubbing / cyanosis. No joint deformity upper and lower extremities. Good ROM, no contractures. Normal muscle tone Skin: no rashes, lesions, ulcers. No induration Neurologic: CN 2-12 grossly intact. Sensation intact, DTR normal. Strength 5/5 in all 4.  Psychiatric: Normal judgment and insight. Alert and oriented x 3. Normal mood  Assessment & Plan:  Acute on chronic hypoxic  and hypercapnic respiratory failure Aspiration pneumonia, recurrent Was on BiPAP on presentation, weaned down to 3 L  Reports having difficulty breathing, VBG with elevated pCO2 -placed on BiPAP. ?? Chronic hypercapnia Suspect patient had episode of choking due to aspiration.  Refused CT due to having episode of unresponsiveness/PEA at [facility], states cannot lay flat COVID/flu/RSV negative CXR shows RUL/apical airspace process stable, may represent focal  infection, chronic left base opacification Change to IV Zosyn , doxycycline  Seen by SLP, aspiration and reflux precautions provided hypertonic saline nebs, Tussionex Incentive spirometry, flutter valve, Chest PT Wean off BiPAP as able, NPO   Chronic HFrEF BNP elevated, mild hypervolemia Elevated troponin likely due to demand ischemia  One dose IV lasix  20mg  Home Lasix , spironolactone   CKD stage 3a Monitor Cr   PAF continue Eliquis  and Coreg    IDDM HbA1c 7.0 Inc Lantus  25 units (home dose 30 units) and sliding scale  HTN Home Benazepril  10 mg, Coreg  IV hydralazine  with holding parameters  Normocytic anemia Monitor Hb  Chronic back pain Continue gabapentin    Anxiety/depression Continue Lexapro , Wellbutrin , as needed Xanax   Stage II sacral wound (POA) WOC consult  OSA CPAP at night  Obesity class II Body mass index is 35.12 kg/m. Outpatient follow up for lifestyle modification and risk factor management  --Bedbound at baseline Will resume home services at discharge PT/OT eval   DVT prophylaxis: Eliquis    Code Status: Limited: Do not attempt resuscitation (DNR) -DNR-LIMITED -Do Not Intubate/DNI  Disposition:  Home  Consultants:  None  Procedures:  None  Antimicrobials:  Anti-infectives (From admission, onward)    Start     Dose/Rate Route Frequency Ordered Stop   04/05/24 1200  amoxicillin -clavulanate (AUGMENTIN ) 875-125 MG per tablet 1 tablet  Status:  Discontinued        1 tablet Oral Every 12 hours 04/05/24 1108 04/05/24 1141   04/05/24 1200  Ampicillin -Sulbactam (UNASYN ) 3 g in sodium chloride  0.9 % 100 mL IVPB        3 g 200 mL/hr over 30 Minutes Intravenous Every 6 hours 04/05/24 1141     04/04/24 1330  doxycycline  (VIBRA -TABS) tablet 100 mg        100 mg Oral Every 12 hours 04/04/24 1235 04/09/24 0959   04/01/24 1830  Ampicillin -Sulbactam (UNASYN ) 3 g in sodium chloride  0.9 % 100 mL IVPB  Status:  Discontinued        3 g 200 mL/hr over 30  Minutes Intravenous Every 6 hours 04/01/24 1800 04/05/24 1108       Data Reviewed: I have personally reviewed following labs and imaging studies CBC: Recent Labs  Lab 04/01/24 1420 04/02/24 0415 04/02/24 0954 04/03/24 0506 04/05/24 1156  WBC 7.1 8.1 8.5 7.2 8.2  HGB 9.6* 8.7* 8.6* 8.9* 8.9*  HCT 31.5* 28.3* 28.1* 29.0* 30.3*  MCV 96.3 95.0 95.6 94.2 97.1  PLT 375 303 295 300 252   Basic Metabolic Panel: Recent Labs  Lab 04/01/24 1420 04/02/24 0954 04/03/24 0506 04/05/24 1156 04/06/24 1524  NA 137 138 138 138 135  K 5.0 4.3 4.0 4.4 4.5  CL 99 100 99 99 96*  CO2 30 30 32 33* 32  GLUCOSE 188* 224* 146* 225* 273*  BUN 31* 28* 26* 28* 24*  CREATININE 1.36* 1.21 1.18 1.36* 1.15  CALCIUM 8.8* 8.9 9.2 9.1 8.6*   GFR: Estimated Creatinine Clearance: 55.4 mL/min (by C-G formula based on SCr of 1.15 mg/dL). Liver Function Tests: Recent Labs  Lab 04/01/24 1420  AST 19  ALT <  5  ALKPHOS 194*  BILITOT 0.2  PROT 7.0  ALBUMIN 3.3*   CBG: Recent Labs  Lab 04/05/24 1723 04/05/24 2129 04/06/24 0734 04/06/24 1210 04/06/24 1704  GLUCAP 197* 184* 137* 265* 222*    Recent Results (from the past 240 hours)  Resp panel by RT-PCR (RSV, Flu A&B, Covid) Anterior Nasal Swab     Status: None   Collection Time: 04/01/24  4:39 PM   Specimen: Anterior Nasal Swab  Result Value Ref Range Status   SARS Coronavirus 2 by RT PCR NEGATIVE NEGATIVE Final    Comment: (NOTE) SARS-CoV-2 target nucleic acids are NOT DETECTED.  The SARS-CoV-2 RNA is generally detectable in upper respiratory specimens during the acute phase of infection. The lowest concentration of SARS-CoV-2 viral copies this assay can detect is 138 copies/mL. A negative result does not preclude SARS-Cov-2 infection and should not be used as the sole basis for treatment or other patient management decisions. A negative result may occur with  improper specimen collection/handling, submission of specimen other than  nasopharyngeal swab, presence of viral mutation(s) within the areas targeted by this assay, and inadequate number of viral copies(<138 copies/mL). A negative result must be combined with clinical observations, patient history, and epidemiological information. The expected result is Negative.  Fact Sheet for Patients:  bloggercourse.com  Fact Sheet for Healthcare Providers:  seriousbroker.it  This test is no t yet approved or cleared by the [place]  FDA and  has been authorized for detection and/or diagnosis of SARS-CoV-2 by FDA under an Emergency Use Authorization (EUA). This EUA will remain  in effect (meaning this test can be used) for the duration of the COVID-19 declaration under Section 564(b)(1) of the Act, 21 U.S.C.section 360bbb-3(b)(1), unless the authorization is terminated  or revoked sooner.       Influenza A by PCR NEGATIVE NEGATIVE Final   Influenza B by PCR NEGATIVE NEGATIVE Final    Comment: (NOTE) The Xpert Xpress SARS-CoV-2/FLU/RSV plus assay is intended as an aid in the diagnosis of influenza from Nasopharyngeal swab specimens and should not be used as a sole basis for treatment. Nasal washings and aspirates are unacceptable for Xpert Xpress SARS-CoV-2/FLU/RSV testing.  Fact Sheet for Patients: bloggercourse.com  Fact Sheet for Healthcare Providers: seriousbroker.it  This test is not yet approved or cleared by the [place]  FDA and has been authorized for detection and/or diagnosis of SARS-CoV-2 by FDA under an Emergency Use Authorization (EUA). This EUA will remain in effect (meaning this test can be used) for the duration of the COVID-19 declaration under Section 564(b)(1) of the Act, 21 U.S.C. section 360bbb-3(b)(1), unless the authorization is terminated or revoked.     Resp Syncytial Virus by PCR NEGATIVE NEGATIVE Final    Comment:  (NOTE) Fact Sheet for Patients: bloggercourse.com  Fact Sheet for Healthcare Providers: seriousbroker.it  This test is not yet approved or cleared by the [place]  FDA and has been authorized for detection and/or diagnosis of SARS-CoV-2 by FDA under an Emergency Use Authorization (EUA). This EUA will remain in effect (meaning this test can be used) for the duration of the COVID-19 declaration under Section 564(b)(1) of the Act, 21 U.S.C. section 360bbb-3(b)(1), unless the authorization is terminated or revoked.  Performed at [facility], [address], [place], [place] [address]      Radiology Studies: No results found.   Scheduled Meds:  apixaban   5 mg Oral BID   aspirin  EC  81 mg Oral Daily   benazepril   10 mg Oral Daily   brimonidine   1 drop Both Eyes TID   buPROPion   300 mg Oral Daily   carvedilol   3.125 mg Oral BID WC   doxycycline   100 mg Oral Q12H   escitalopram   20 mg Oral Daily   finasteride   5 mg Oral Daily   furosemide   20 mg Oral Daily   gabapentin   200 mg Oral BID   Gerhardt's butt cream   Topical BID   guaiFENesin   600 mg Oral BID   insulin  aspart  0-15 Units Subcutaneous TID WC   insulin  aspart  0-5 Units Subcutaneous QHS   insulin  glargine  25 Units Subcutaneous Daily   ipratropium  0.5 mg Nebulization BID   magnesium  oxide  400 mg Oral Daily   pantoprazole   40 mg Oral Daily   pravastatin   40 mg Oral Daily   spironolactone   12.5 mg Oral Daily   tamsulosin   0.4 mg Oral Daily   Continuous Infusions:  ampicillin -sulbactam (UNASYN ) IV 3 g (04/06/24 1803)     LOS: 5 days  MDM: Patient is high risk for one or more organ failure.  They necessitate ongoing hospitalization for continued IV therapies and subsequent lab monitoring. Total time spent interpreting labs and vitals, reviewing the medical record, coordinating care amongst consultants and care team members, directly assessing and  discussing care with the patient and/or family: 55 min Laree Lock, MD [facility]  To contact the attending physician between 7A-7P please use Epic Chat. To contact the covering physician during after hours 7P-7A, please review Amion.  04/06/2024, 6:07 PM   *This document has been created with the assistance of dictation software. Please excuse typographical errors. *   "

## 2024-04-06 NOTE — Progress Notes (Signed)
" °  ° °      CROSS COVER NOTE  NAME: Derek Andersen MRN: [id number] DOB : 1940/07/26 ATTENDING PHYSICIAN: Jerelene Critchley, MD    Date of Service   04/06/2024   HPI/Events of Note   Subjective: Message received from RN reporting Derek Andersen is  Hypotensive when sleep BP 78/39 (52) HR in 50s. When awake and stimulated BP 100/55 MAP 68.  Blood pressure began to decrease after dose of 20 mg IV Lasix  around 7 PM tonight, suspect hypotension is a combination of slight overdiuresis, decrease sympathetic activity when sleep with decreased venous return (lasix  +bipap) and resultant decreased preload.   Vitals:   04/06/24 2030 04/06/24 2045 04/06/24 2100 04/06/24 2111  BP: (!) 83/47 (!) 76/43 (!) 87/43   Pulse: (!) 53 (!) 52 (!) 56 (!) 52  Resp: (!) 21 18 (!) 9 19  Temp:      TempSrc:      SpO2: 100% 100% 96% 100%  Weight:      Height:         Interventions   Assessment/Plan: 250 mL NS bolus Midodrine  x1       To reach the provider On-Call:   7AM- 7PM see care teams to locate the attending and reach out to them via [email or web address] Password: [email or web address] 7PM-7AM contact night-coverage If you still have difficulty reaching the appropriate provider, please page the [facility] (Director on Call) for [facility] on amion for assistance  This document was prepared using Dragon voice recognition software and may include unintentional dictation errors.  Derek Rams  FNP-BC, PMHNP-BC Nurse Practitioner [facility] [place]  "

## 2024-04-06 NOTE — Progress Notes (Signed)
 Physical Therapy Treatment Patient Details Name: Derek Andersen MRN: [id number] DOB: 10-16-1940 Today's Date: 04/06/2024   History of Present Illness Derek Andersen is a 84 y.o. 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 Patient was on BiPAP and severe respiratory distress on presentation to ER.    PT Comments  Pt was long sitting in bed upon arrival. Personal/private hired caregiver at bedside but leaves at beginning of session. Pt agrees to EOB sitting however upon rolling to side max assist, discovered pt had BM. +2 assistance required to safely turn from side to side to allow hygiene care to be performed. Pt does have severe pain in back with movements however HOB was maintained at > 30 degrees. Pt visible fatigue after only rolling in bed side to side. New flutter device issued to help congestive cough in hopes to secrete mucus. Pt will greatly benefit from continued skilled PT at DC to maximize independence and safety while decreasing caregiver burden.    If plan is discharge home, recommend the following: Two people to help with walking and/or transfers;Two people to help with bathing/dressing/bathroom;Assistance with cooking/housework;Assistance with feeding;Direct supervision/assist for medications management;Direct supervision/assist for financial management;Assist for transportation;Help with stairs or ramp for entrance     Equipment Recommendations   (pt endorses having hoyer lift, and w/c at home. Stated he has a bed rail that performs the same functions as hospital bed)       Precautions / Restrictions Precautions Precautions: Fall Recall of Precautions/Restrictions: Intact Restrictions Weight Bearing Restrictions Per Provider Order: No      Mobility  Bed Mobility Overal bed mobility: Needs Assistance Bed Mobility: Rolling, Sidelying to Sit, Supine to Sit Rolling: Max assist, +2 for physical assistance, +2 for safety/equipment, Used rails Supine to sit: +2 for physical assistance, HOB elevated, Max assist, +2 for safety/equipment Sit to supine: +2 for physical assistance, Max assist, +2 for safety/equipment General bed mobility comments: Pt rolled to sit up EOB however discovered large BM. no NT at this time. +2 form RN to perform hygiene care to roll L and R.+2 max assist    Transfers  General transfer comment: pt is currently only hoyer lift transfer appropriate           Communication Communication Communication: No apparent difficulties  Cognition Arousal: Alert Behavior During Therapy: WFL for tasks assessed/performed   PT - Cognitive impairments: No apparent impairments    PT - Cognition Comments: Pt is A and O but heart set on not going to rehab Following commands: Intact      Cueing Cueing Techniques: Verbal cues     General Comments General comments (skin integrity, edema, etc.): session was severely limited by BM and need to have hygiene care performed      Pertinent Vitals/Pain Pain Assessment Pain Assessment: 0-10 Pain Score: 8  Pain Location: chronic back pain Pain Descriptors / Indicators: Discomfort Pain Intervention(s): Limited activity within patient's tolerance, Monitored during session, Premedicated before session, Repositioned     PT Goals (current goals can now be found in the care plan section) Acute Rehab PT Goals Patient Stated Goal: return home Progress towards PT goals: Not progressing toward goals - comment    Frequency    Min 2X/week       Co-evaluation  PT goals addressed during session: Mobility/safety with mobility;Balance;Proper use of DME;Strengthening/ROM        AM-PAC PT 6 Clicks Mobility   Outcome Measure  Help needed turning from your  back to your side while in a flat bed without using bedrails?: A Lot Help needed moving from lying on your back to sitting on the side of a flat bed without using bedrails?: Total Help needed moving to and from a bed to a chair (including a wheelchair)?: Total Help needed standing up from a chair using your arms (e.g., wheelchair or bedside chair)?: Total Help needed to walk in hospital room?: Total Help needed climbing 3-5 steps with a railing? : Total 6 Click Score: 7    End of Session   Activity Tolerance: Patient limited by pain;Other (comment) (limited by incontinence BM) Patient left: in bed;with call bell/phone within reach;with bed alarm set;with family/visitor present Nurse Communication: Mobility status PT Visit Diagnosis: Other abnormalities of gait and mobility (R26.89);Muscle weakness (generalized) (M62.81);Unsteadiness on feet (R26.81)     Time: 8749-8685 PT Time Calculation (min) (ACUTE ONLY): 24 min  Charges:    $Therapeutic Activity: 23-37 mins PT General Charges $$ ACUTE PT VISIT: 1 Visit                     Rankin Essex PTA 04/06/24, 1:50 PM

## 2024-04-06 NOTE — Progress Notes (Signed)
 Caregiver at bedside received an alert on her phone that blood sugar monitor has detected a BS of 69. Blood sugar checked with facility glucometer. BS is 74. Pt receiving scheduled PO meds at this time along with approximately 240mL of apple juice.

## 2024-04-06 NOTE — Progress Notes (Signed)
 Occupational Therapy Treatment Patient Details Name: Derek Andersen MRN: [id number] DOB: Jan 10, 1941 Today's Date: 04/06/2024   History of present illness Derek Andersen is a 84 y.o. 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 Patient was on BiPAP and severe respiratory distress on presentation to ER.   OT comments  Upon entering the room, pt supine in bed and appears fatigued. OT brought universal hand cuff this session as discussed as plan during evaluation. Pt needing assistance to don onto R hand. Spoon placed inside and pt demonstrates ability to scoop with spoon and then bring to mouth. Pt having difficulty remaining awake during session. Cuff left on tray table and asked pt to ask his caregiver to assist him with dinner utilizing it. He verbalized understanding and was quickly asleep before therapist exits the room.       If plan is discharge home, recommend the following:  Two people to help with walking and/or transfers;A lot of help with bathing/dressing/bathroom;Assistance with cooking/housework;Direct supervision/assist for medications management;Direct supervision/assist for financial management;Help with stairs or ramp for entrance   Equipment Recommendations  Hospital bed       Precautions / Restrictions Precautions Precautions: Fall Recall of Precautions/Restrictions: Intact Precaution/Restrictions Comments: HOB above 30 degrees              ADL either performed or assessed with clinical judgement   ADL Overall ADL's : Needs assistance/impaired                                       General ADL Comments: assistance to don universal cuff onto R hand and pt demonstrates ability to scoop and bring to mouth with  cuing    Extremity/Trunk Assessment Upper Extremity Assessment Upper Extremity Assessment: Generalized weakness;Right hand dominant   Lower Extremity Assessment Lower Extremity Assessment: Generalized weakness        Vision Patient Visual Report: No change from baseline           Communication Communication Communication: No apparent difficulties   Cognition Arousal: Lethargic Behavior During Therapy: WFL for tasks assessed/performed                                 Following commands: Intact        Cueing   Cueing Techniques: Verbal cues        General Comments session was severely limited by BM and need to have hygiene care performed    Pertinent Vitals/ Pain       Pain Assessment Pain Assessment: No/denies pain         Frequency  Min 2X/week        Progress Toward Goals  OT Goals(current goals can now be found in the care plan section)  Progress towards OT goals: Progressing toward goals         Co-evaluation        PT goals addressed during session: Mobility/safety with mobility;Balance;Proper use of DME;Strengthening/ROM        AM-PAC OT 6 Clicks Daily Activity     Outcome Measure   Help from another person eating meals?: A Lot Help from  another person taking care of personal grooming?: A Lot Help from another person toileting, which includes using toliet, bedpan, or urinal?: Total Help from another person bathing (including washing, rinsing, drying)?: A Lot Help from another person to put on and taking off regular upper body clothing?: A Lot Help from another person to put on and taking off regular lower body clothing?: Total 6 Click Score: 10    End of Session    OT Visit Diagnosis: Unsteadiness on feet (R26.81);Muscle weakness (generalized) (M62.81);Other abnormalities of gait and mobility (R26.89)   Activity Tolerance Patient limited by lethargy   Patient Left in bed;with call bell/phone within reach;with  family/visitor present   Nurse Communication Mobility status        Time: 8493-8479 OT Time Calculation (min): 14 min  Charges: OT General Charges $OT Visit: 1 Visit OT Treatments $Self Care/Home Management : 8-22 mins  Derek Claude, MS, OTR/L , CBIS ascom [phone]  04/06/24, 3:27 PM

## 2024-04-07 DIAGNOSIS — J9622 Acute and chronic respiratory failure with hypercapnia: Secondary | ICD-10-CM | POA: Diagnosis not present

## 2024-04-07 DIAGNOSIS — Y95 Nosocomial condition: Secondary | ICD-10-CM

## 2024-04-07 DIAGNOSIS — J189 Pneumonia, unspecified organism: Secondary | ICD-10-CM | POA: Diagnosis not present

## 2024-04-07 DIAGNOSIS — R4182 Altered mental status, unspecified: Secondary | ICD-10-CM | POA: Diagnosis not present

## 2024-04-07 DIAGNOSIS — J9621 Acute and chronic respiratory failure with hypoxia: Secondary | ICD-10-CM | POA: Diagnosis not present

## 2024-04-07 LAB — BASIC METABOLIC PANEL WITH GFR
Anion gap: 8 (ref 5–15)
BUN: 25 mg/dL — ABNORMAL HIGH (ref 8–23)
CO2: 31 mmol/L (ref 22–32)
Calcium: 8.7 mg/dL — ABNORMAL LOW (ref 8.9–10.3)
Chloride: 98 mmol/L (ref 98–111)
Creatinine, Ser: 1.22 mg/dL (ref 0.61–1.24)
GFR, Estimated: 60 mL/min — ABNORMAL LOW
Glucose, Bld: 103 mg/dL — ABNORMAL HIGH (ref 70–99)
Potassium: 4.4 mmol/L (ref 3.5–5.1)
Sodium: 137 mmol/L (ref 135–145)

## 2024-04-07 LAB — CBC
HCT: 29.8 % — ABNORMAL LOW (ref 39.0–52.0)
Hemoglobin: 8.9 g/dL — ABNORMAL LOW (ref 13.0–17.0)
MCH: 28.9 pg (ref 26.0–34.0)
MCHC: 29.9 g/dL — ABNORMAL LOW (ref 30.0–36.0)
MCV: 96.8 fL (ref 80.0–100.0)
Platelets: 247 10*3/uL (ref 150–400)
RBC: 3.08 MIL/uL — ABNORMAL LOW (ref 4.22–5.81)
RDW: 15.8 % — ABNORMAL HIGH (ref 11.5–15.5)
WBC: 7.8 10*3/uL (ref 4.0–10.5)
nRBC: 0 % (ref 0.0–0.2)

## 2024-04-07 LAB — GLUCOSE, CAPILLARY
Glucose-Capillary: 80 mg/dL (ref 70–99)
Glucose-Capillary: 108 mg/dL — ABNORMAL HIGH (ref 70–99)
Glucose-Capillary: 83 mg/dL (ref 70–99)
Glucose-Capillary: 175 mg/dL — ABNORMAL HIGH (ref 70–99)
Glucose-Capillary: 171 mg/dL — ABNORMAL HIGH (ref 70–99)
Glucose-Capillary: 300 mg/dL — ABNORMAL HIGH (ref 70–99)

## 2024-04-07 MED ORDER — ENSURE PLUS HIGH PROTEIN PO LIQD
237.0000 mL | Freq: Two times a day (BID) | ORAL | Status: DC
  Administered 2024-04-08 – 2024-04-09 (×3): 237 mL via ORAL

## 2024-04-07 NOTE — Plan of Care (Signed)
" °  Problem: Education: Goal: Ability to describe self-care measures that may prevent or decrease complications (Diabetes Survival Skills Education) will improve Outcome: Progressing   Problem: Fluid Volume: Goal: Ability to maintain a balanced intake and output will improve Outcome: Progressing   Problem: Health Behavior/Discharge Planning: Goal: Ability to manage health-related needs will improve Outcome: Progressing   Problem: Metabolic: Goal: Ability to maintain appropriate glucose levels will improve Outcome: Progressing   Problem: Nutritional: Goal: Maintenance of adequate nutrition will improve Outcome: Progressing   Problem: Skin Integrity: Goal: Risk for impaired skin integrity will decrease Outcome: Progressing   Problem: Clinical Measurements: Goal: Ability to maintain clinical measurements within normal limits will improve Outcome: Progressing   Problem: Activity: Goal: Risk for activity intolerance will decrease Outcome: Not Progressing   Problem: Elimination: Goal: Will not experience complications related to bowel motility Outcome: Progressing Goal: Will not experience complications related to urinary retention Outcome: Progressing   Problem: Safety: Goal: Ability to remain free from injury will improve Outcome: Progressing   Problem: Skin Integrity: Goal: Risk for impaired skin integrity will decrease Outcome: Progressing   "

## 2024-04-07 NOTE — Progress Notes (Signed)
 Caregiver at bedside watching the blood sugar monitor connected to cell phone. She states His blood sugar is 90. I'm concerned about it dropping. This nurse has given her reassurance multiple times that the the pts blood sugar is being monitored via Q4 finger sticks and if and when his BS is below 70, there is treatment already ordered and in place by the provider to give IV glucose while the pt remains NPO for BIPAP therapy.

## 2024-04-07 NOTE — Progress Notes (Signed)
 " PROGRESS NOTE [name]  FMW:[id number] DOB: 1940/08/01 DOA: 04/01/2024 PCP: Diedra Lame, MD  Chief Complaint  Patient presents with   Respiratory Distress   Hospital Course:  Derek Andersen is a 84 y.o. 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 Patient was on BiPAP and severe respiratory distress on presentation to ER.  Was placed on BiPAP on presentation, improved clinically on 3 L [place] which is baseline.  Continuing IV antibiotics, refused to CT. Had worsening symptoms on 1/27 and placed on bipap with Abx broadening.   Subjective: Pt reports feeling improved today. Still says he's felt better. Denies SOB at rest.   Objective: Vitals:   04/07/24 0600 04/07/24 0615 04/07/24 0630 04/07/24 0743  BP: (!) 110/48 (!) 111/57 (!) 117/55 (!) 124/52  Pulse: 64 61 63 66  Resp: 19 15 15 20   Temp:    98.7 F (37.1 C)  TempSrc:      SpO2: 97% 97% 94% 99%  Weight:      Height:        Intake/Output Summary (Last 24 hours) at 04/07/2024 0802 Last data filed at 04/07/2024 0247 Gross per 24 hour  Intake --  Output 3050 ml  Net -3050 ml   Filed Weights   04/01/24 1412  Weight: 98.7 kg    Examination: Neck: normal, supple, no masses, no thyromegaly Respiratory: Bibasilar crackles, rhonchi present.  No wheezing, no accessory muscle use.  Cardiovascular: Regular rate and rhythm, no murmurs / rubs / gallops. Musculoskeletal: no clubbing / cyanosis. No joint deformity upper and lower extremities. Good ROM, no contractures. Normal muscle tone Skin: no rashes, lesions, ulcers. No induration Neurologic: CN 2-12 grossly intact. Sensation intact, DTR normal. Strength 5/5 in all 4.  Psychiatric: Normal judgment and insight. Alert and oriented x  3. Normal mood  Assessment & Plan:  Acute on chronic hypoxic and hypercapnic respiratory failure Aspiration pneumonia, recurrent Was on BiPAP on presentation, weaned down to 3 L [place] Chronic hypercapnia Suspect patient had episode of choking due to aspiration.  Refused CT due to having episode of unresponsiveness/PEA at [facility], states cannot lay flat COVID/flu/RSV negative CXR shows RUL/apical airspace process stable, may represent focal infection, chronic left base opacification continue IV Zosyn  + doxycycline  Seen by SLP, aspiration and reflux precautions provided hypertonic saline nebs, Tussionex Incentive spirometry, flutter valve, Chest PT Wean off BiPAP as able, NPO   Chronic HFrEF BNP elevated, mild hypervolemia Elevated troponin likely due to demand ischemia  One dose IV lasix  20mg  Home Lasix , spironolactone   CKD stage 3a Monitor Cr   PAF continue Eliquis  and Coreg    IDDM- HbA1c 7.0 - continue sliding scale - stopping long-acting for fear of hypoglycemic events and patient is better than goal A1c for age.   HTN Home Benazepril  10 mg, Coreg   Normocytic anemia Monitor Hb  Chronic back pain Continue gabapentin    Anxiety/depression Continue Lexapro , Wellbutrin , as needed Xanax   Stage II sacral wound (POA) WOC consult  OSA CPAP at night  Obesity class II Body mass index is 35.12 kg/m. Outpatient follow up for lifestyle modification and risk factor management  --Bedbound at baseline Will resume home services at discharge PT/OT eval  DVT prophylaxis: Eliquis    Code Status: Limited: Do not attempt resuscitation (DNR) -DNR-LIMITED -  Do Not Intubate/DNI   Disposition:  Home  Consultants:  None  Procedures:  None  Antimicrobials:  Anti-infectives (From admission, onward)    Start     Dose/Rate Route Frequency Ordered Stop   04/06/24 2200  piperacillin -tazobactam (ZOSYN ) IVPB 3.375 g  Status:  Discontinued        3.375 g 12.5 mL/hr over 240 Minutes  Intravenous Every 8 hours 04/06/24 1837 04/06/24 1932   04/06/24 2200  vancomycin  (VANCOREADY) IVPB 1500 mg/300 mL  Status:  Discontinued        1,500 mg 150 mL/hr over 120 Minutes Intravenous Every 24 hours 04/06/24 1854 04/06/24 1932   04/06/24 2200  piperacillin -tazobactam (ZOSYN ) IVPB 3.375 g        3.375 g 12.5 mL/hr over 240 Minutes Intravenous Every 8 hours 04/06/24 1932     04/06/24 1930  vancomycin  (VANCOREADY) IVPB 2000 mg/400 mL  Status:  Discontinued        2,000 mg 200 mL/hr over 120 Minutes Intravenous  Once 04/06/24 1843 04/06/24 1932   04/05/24 1200  amoxicillin -clavulanate (AUGMENTIN ) 875-125 MG per tablet 1 tablet  Status:  Discontinued        1 tablet Oral Every 12 hours 04/05/24 1108 04/05/24 1141   04/05/24 1200  Ampicillin -Sulbactam (UNASYN ) 3 g in sodium chloride  0.9 % 100 mL IVPB  Status:  Discontinued        3 g 200 mL/hr over 30 Minutes Intravenous Every 6 hours 04/05/24 1141 04/06/24 1811   04/04/24 1330  doxycycline  (VIBRA -TABS) tablet 100 mg        100 mg Oral Every 12 hours 04/04/24 1235 04/09/24 0959   04/01/24 1830  Ampicillin -Sulbactam (UNASYN ) 3 g in sodium chloride  0.9 % 100 mL IVPB  Status:  Discontinued        3 g 200 mL/hr over 30 Minutes Intravenous Every 6 hours 04/01/24 1800 04/05/24 1108       Data Reviewed: I have personally reviewed following labs and imaging studies CBC: Recent Labs  Lab 04/02/24 0415 04/02/24 0954 04/03/24 0506 04/05/24 1156 04/07/24 0535  WBC 8.1 8.5 7.2 8.2 7.8  HGB 8.7* 8.6* 8.9* 8.9* 8.9*  HCT 28.3* 28.1* 29.0* 30.3* 29.8*  MCV 95.0 95.6 94.2 97.1 96.8  PLT 303 295 300 252 247   Basic Metabolic Panel: Recent Labs  Lab 04/03/24 0506 04/05/24 1156 04/06/24 1524 04/06/24 2303 04/07/24 0535  NA 138 138 135 137 137  K 4.0 4.4 4.5 4.5 4.4  CL 99 99 96* 97* 98  CO2 32 33* 32 33* 31  GLUCOSE 146* 225* 273* 112* 103*  BUN 26* 28* 24* 25* 25*  CREATININE 1.18 1.36* 1.15 1.20 1.22  CALCIUM 9.2 9.1 8.6* 8.5*  8.7*    LOS: 6 days  MDM: Patient is high risk for one or more organ failure.  They necessitate ongoing hospitalization for continued IV therapies and subsequent lab monitoring. Total time spent interpreting labs and vitals, reviewing the medical record, coordinating care amongst consultants and care team members, directly assessing and discussing care with the patient and/or family: 55 min Marien LITTIE Piety, MD [facility]  To contact the attending physician between 7A-7P please use Epic Chat. To contact the covering physician during after hours 7P-7A, please review Amion.  04/07/2024, 8:02 AM   "

## 2024-04-08 ENCOUNTER — Other Ambulatory Visit: Payer: Self-pay

## 2024-04-08 DIAGNOSIS — J9622 Acute and chronic respiratory failure with hypercapnia: Secondary | ICD-10-CM

## 2024-04-08 DIAGNOSIS — R4182 Altered mental status, unspecified: Secondary | ICD-10-CM | POA: Diagnosis not present

## 2024-04-08 DIAGNOSIS — J9621 Acute and chronic respiratory failure with hypoxia: Principal | ICD-10-CM

## 2024-04-08 LAB — BASIC METABOLIC PANEL WITH GFR
Anion gap: 5 (ref 5–15)
BUN: 25 mg/dL — ABNORMAL HIGH (ref 8–23)
CO2: 35 mmol/L — ABNORMAL HIGH (ref 22–32)
Calcium: 8.9 mg/dL (ref 8.9–10.3)
Chloride: 99 mmol/L (ref 98–111)
Creatinine, Ser: 1.29 mg/dL — ABNORMAL HIGH (ref 0.61–1.24)
GFR, Estimated: 56 mL/min — ABNORMAL LOW
Glucose, Bld: 138 mg/dL — ABNORMAL HIGH (ref 70–99)
Potassium: 4.7 mmol/L (ref 3.5–5.1)
Sodium: 139 mmol/L (ref 135–145)

## 2024-04-08 LAB — BLOOD GAS, VENOUS
Acid-Base Excess: 10.3 mmol/L — ABNORMAL HIGH (ref 0.0–2.0)
Bicarbonate: 37 mmol/L — ABNORMAL HIGH (ref 20.0–28.0)
O2 Saturation: 29.4 %
Patient temperature: 37
pCO2, Ven: 57 mmHg (ref 44–60)
pH, Ven: 7.42 (ref 7.25–7.43)

## 2024-04-08 LAB — CBC
HCT: 29.5 % — ABNORMAL LOW (ref 39.0–52.0)
Hemoglobin: 9 g/dL — ABNORMAL LOW (ref 13.0–17.0)
MCH: 28.9 pg (ref 26.0–34.0)
MCHC: 30.5 g/dL (ref 30.0–36.0)
MCV: 94.9 fL (ref 80.0–100.0)
Platelets: 211 10*3/uL (ref 150–400)
RBC: 3.11 MIL/uL — ABNORMAL LOW (ref 4.22–5.81)
RDW: 15.9 % — ABNORMAL HIGH (ref 11.5–15.5)
WBC: 7.7 10*3/uL (ref 4.0–10.5)
nRBC: 0 % (ref 0.0–0.2)

## 2024-04-08 LAB — GLUCOSE, CAPILLARY
Glucose-Capillary: 185 mg/dL — ABNORMAL HIGH (ref 70–99)
Glucose-Capillary: 139 mg/dL — ABNORMAL HIGH (ref 70–99)
Glucose-Capillary: 231 mg/dL — ABNORMAL HIGH (ref 70–99)
Glucose-Capillary: 351 mg/dL — ABNORMAL HIGH (ref 70–99)
Glucose-Capillary: 210 mg/dL — ABNORMAL HIGH (ref 70–99)

## 2024-04-08 MED ORDER — AMOXICILLIN-POT CLAVULANATE 875-125 MG PO TABS
1.0000 | ORAL_TABLET | Freq: Two times a day (BID) | ORAL | 0 refills | Status: AC
  Filled 2024-04-08: qty 6, 3d supply, fill #0

## 2024-04-08 NOTE — Discharge Summary (Signed)
 " Physician Discharge Summary  Patient: Derek Andersen FMW:[id number] DOB: 1940/10/16   Code Status: Limited: Do not attempt resuscitation (DNR) -DNR-LIMITED -Do Not Intubate/DNI  Admit date: 04/01/2024 Discharge date: 04-11-24 Disposition: Home, PT, OT, and nurse aid PCP: Diedra Lame, MD  Recommendations for Outpatient Follow-up:  Follow up with PCP within 1-2 weeks Regarding general hospital follow up and preventative care Recommend CBC, BMP  Discharge Diagnoses:  Principal Problem:   PNA (pneumonia) Active Problems:   Aspiration pneumonia (HCC)   Altered mental status   Acute on chronic respiratory failure with hypoxia and hypercapnia [facility])  Brief Hospital Course Summary: Derek Andersen is a 84 y.o. male with medical history significant of DISH with multiple cervical spine surgery, PAF on Eliquis , CAD status post CABG, CKD stage II, chronic HFrEF, recent cardiac arrest on New Year's Day, chronic hypoxic respitory failure on 3 L starting 2 weeks ago, presented with aspiration event.  On presentation to the ED, Patient was on BiPAP and severe respiratory distress. He refused chest CT scan. Treated empirically with IV Abx.  He progressively improved with the exception of mild worsening on 1/27 when his antibiotic course was broadened and he again improved.  SLP evaluated and recommended dysphagia 3 diet. He is a continued risk of aspiration.  He was weaned to room air. VBG was normal.  PT recommended SNF but patient and family declined and instead want to go home with home health.  He is discharged home today in his baseline health status. Continuing augmentin  to complete 10 day antibiotic course after completing 7 days IV course.   All other chronic conditions were treated with home medications.    Discharge Condition: Good, improved Recommended discharge diet: dysphagia 3 with assistance and stay  in upright position  Consultations: None   Procedures/Studies: None    Allergies as of 2024-04-11       Reactions   Oxycodone     Patient states you don't know where you are at, and it screws you up. It also gives crazy dreams.   Prednisone Other (See Comments)   Memory loss   Latex         Medication List     STOP taking these medications    Jardiance 25 MG Tabs tablet Generic drug: empagliflozin       TAKE these medications    acetaminophen  325 MG tablet Commonly known as: TYLENOL  Take 650 mg by mouth every 6 (six) hours as needed for headache.   ALPRAZolam  0.25 MG tablet Commonly known as: XANAX  Take 1 tablet (0.25 mg total) by mouth 2 (two) times daily as needed for anxiety.   amoxicillin -clavulanate 875-125 MG tablet Commonly known as: AUGMENTIN  Take 1 tablet by mouth 2 (two) times daily for 3 days.   apixaban  5 MG Tabs tablet Commonly known as: ELIQUIS  Take 5 mg by mouth 2 (two) times daily.   aspirin  EC 81 MG tablet Take 81 mg by mouth daily.   Basaglar  KwikPen 100 UNIT/ML Inject 30 Units into the skin daily.   benazepril  10 MG tablet Commonly known as: LOTENSIN  Take 10 mg by mouth daily.   brimonidine  0.2 % ophthalmic solution Commonly known as: ALPHAGAN  Place 1 drop into both eyes 3 (three) times daily.   buPROPion  300 MG 24 hr tablet Commonly known as: WELLBUTRIN  XL Take 300 mg by mouth daily.   calcium carbonate 750 MG chewable tablet Commonly known as: TUMS EX Chew 750 mg by mouth in the morning and at bedtime.  carvedilol  3.125 MG tablet Commonly known as: COREG  Take 3.125 mg by mouth 2 (two) times daily with a meal.   Cholecalciferol  25 MCG (1000 UT) capsule Take 1 tablet by mouth daily.   cyanocobalamin 1000 MCG tablet Commonly known as: VITAMIN B12 Take 1,000 mcg by mouth daily.   diclofenac  Sodium 1 % Gel Commonly known as: VOLTAREN  Apply 2 g topically in the morning and at bedtime.   escitalopram  20 MG tablet Commonly known as: LEXAPRO  Take 1 tablet (20 mg total) by mouth daily.    ferrous sulfate  325 (65 FE) MG tablet Take 325 mg by mouth daily.   finasteride  5 MG tablet Commonly known as: PROSCAR  Take 5 mg by mouth daily.   Fish Oil 1200 MG Caps Take 2,400 mg by mouth in the morning and at bedtime.   fluticasone  50 MCG/ACT nasal spray Commonly known as: FLONASE  Place 2 sprays into both nostrils daily.   furosemide  20 MG tablet Commonly known as: LASIX  Take 20 mg by mouth daily.   gabapentin  300 MG capsule Commonly known as: NEURONTIN  One tab po qam and 2 tabs po in evening   HYDROcodone -acetaminophen  5-325 MG tablet Commonly known as: NORCO/VICODIN Take 1 tablet by mouth every 6 (six) hours as needed for severe pain (pain score 7-10).   hydrocortisone  2.5 % cream Apply 1 Application topically 2 (two) times daily as needed (facial rash).   insulin  aspart 100 UNIT/ML injection Commonly known as: novoLOG  Inject 0-50 Units into the skin 3 (three) times daily before meals.   NovoLOG  FlexPen 100 UNIT/ML FlexPen Generic drug: insulin  aspart PLEASE SEE ATTACHED FOR DETAILED DIRECTIONS   magnesium  oxide 400 MG tablet Commonly known as: MAG-OX Take 400 mg by mouth daily.   multivitamin with minerals Tabs tablet Take 1 tablet by mouth daily.   pantoprazole  40 MG tablet Commonly known as: PROTONIX  Take 40 mg by mouth daily.   polyethylene glycol 17 g packet Commonly known as: MIRALAX  / GLYCOLAX  Take 17 g by mouth daily as needed.   pravastatin  40 MG tablet Commonly known as: PRAVACHOL  Take 40 mg by mouth daily.   spironolactone  25 MG tablet Commonly known as: ALDACTONE  Take 0.5 tablets (12.5 mg total) by mouth daily.   tamsulosin  0.4 MG Caps capsule Commonly known as: FLOMAX  Take 0.4 mg by mouth daily.        Follow-up Information     Diedra Lame, MD. Schedule an appointment as soon as possible for a visit in 1 week(s).   Specialty: Family Medicine Contact information: [address] [facility] and  Internal Medicine [place] [address] [phone]                Subjective   Pt reports feeling well today. Denies SOB at rest while on room air. Denies chest pain. Continues to have mild cough. Tolerating his diet well.   All questions and concerns were addressed at time of discharge.  Objective  Blood pressure (!) 139/56, pulse (!) 58, temperature 98.4 F (36.9 C), resp. rate 19, height 5' 6 (1.676 m), weight 98.7 kg, SpO2 95%.   General: Pt is alert, awake, not in acute distress Cardiovascular: RRR, S1/S2 +, no rubs, no gallops Respiratory: CTA bilaterally, no wheezing, no rhonchi Abdominal: Soft, NT, ND, bowel sounds + Extremities: no edema, no cyanosis  The results of significant diagnostics from this hospitalization (including imaging, microbiology, ancillary and laboratory) are listed below for reference.   Imaging studies: DG Chest 1  View Result Date: 04/06/2024 EXAM: 1 VIEW(S) XRAY OF THE CHEST 04/06/2024 02:06:00 PM COMPARISON: 04/01/2024 CLINICAL HISTORY: Pneumonia. FINDINGS: LUNGS AND PLEURA: Low lung volumes. Stable right suprahilar infiltrate. Persistent left retrocardiac opacity due to a combination of elevation of the left hemidiaphragm and superimposed left basilar atelectasis or infiltrate. Infiltrate at the left lung base appears progressive since prior examination. Together, the findings may reflect changes of superimposed multifocal pneumonia. Insufflation has remained stable. No pleural effusion. No pneumothorax. HEART AND MEDIASTINUM: No acute abnormality of the cardiac and mediastinal silhouettes. BONES AND SOFT TISSUES: Cervical through thoracolumbar spinal fusion hardware. IMPRESSION: 1. Progressive left basilar infiltrate with stable right suprahilar infiltrate, consistent with multifocal pneumonia. 2. Low lung volumes with persistent elevation of the left hemidiaphragm. Electronically signed by: Dorethia Molt MD 04/06/2024 08:56 PM EST RP Workstation:  [id number]   DG Chest Portable 1 View Result Date: 04/01/2024 CLINICAL DATA:  Shortness of breath. EXAM: PORTABLE CHEST 1 VIEW COMPARISON:  03/24/2024, 06/26/2023 FINDINGS: Lungs are hypoinflated with persistent left base/retrocardiac opacification possible slight interval improvement as these findings were also present April 2025. Persistent airspace opacification over the right upper lobe/apex new compared April 2025. Cardiomediastinal silhouette and remainder of the exam is unchanged. IMPRESSION: Right upper lobe/apical airspace process which is stable in may represent focal infection. Chronic left base opacification. Electronically Signed   By: Toribio Agreste M.D.   On: 04/01/2024 14:49   DG Chest 1 View Result Date: 03/24/2024 EXAM: 1 VIEW(S) XRAY OF THE CHEST 03/24/2024 11:11:00 PM COMPARISON: 06/26/2023 CLINICAL HISTORY: SOB (shortness of breath) FINDINGS: LUNGS AND PLEURA: Low lung volumes. Left basilar airspace opacity. Right upper lobe airspace opacity. Small left pleural effusion. No pneumothorax. HEART AND MEDIASTINUM: No acute abnormality of the cardiac and mediastinal silhouettes. BONES AND SOFT TISSUES: Thoracic surgical hardware noted. No acute osseous abnormality. IMPRESSION: 1. Left basilar and right upper lobe airspace opacities. 2. Small left pleural effusion. 3. Low lung volumes. Electronically signed by: Kate Plummer MD 03/24/2024 11:15 PM EST RP Workstation: [id number]    Labs: Basic Metabolic Panel: Recent Labs  Lab 04/05/24 1156 04/06/24 1524 04/06/24 2303 04/07/24 0535 04/08/24 0839  NA 138 135 137 137 139  K 4.4 4.5 4.5 4.4 4.7  CL 99 96* 97* 98 99  CO2 33* 32 33* 31 35*  GLUCOSE 225* 273* 112* 103* 138*  BUN 28* 24* 25* 25* 25*  CREATININE 1.36* 1.15 1.20 1.22 1.29*  CALCIUM 9.1 8.6* 8.5* 8.7* 8.9   CBC: Recent Labs  Lab 04/02/24 0954 04/03/24 0506 04/05/24 1156 04/07/24 0535 04/08/24 0839  WBC 8.5 7.2 8.2 7.8 7.7  HGB 8.6* 8.9* 8.9* 8.9* 9.0*  HCT  28.1* 29.0* 30.3* 29.8* 29.5*  MCV 95.6 94.2 97.1 96.8 94.9  PLT 295 300 252 247 211   Microbiology: Results for orders placed or performed during the hospital encounter of 04/01/24  Resp panel by RT-PCR (RSV, Flu A&B, Covid) Anterior Nasal Swab     Status: None   Collection Time: 04/01/24  4:39 PM   Specimen: Anterior Nasal Swab  Result Value Ref Range Status   SARS Coronavirus 2 by RT PCR NEGATIVE NEGATIVE Final    Comment: (NOTE) SARS-CoV-2 target nucleic acids are NOT DETECTED.  The SARS-CoV-2 RNA is generally detectable in upper respiratory specimens during the acute phase of infection. The lowest concentration of SARS-CoV-2 viral copies this assay can detect is 138 copies/mL. A negative result does not preclude SARS-Cov-2 infection and should not be used as the sole  basis for treatment or other patient management decisions. A negative result may occur with  improper specimen collection/handling, submission of specimen other than nasopharyngeal swab, presence of viral mutation(s) within the areas targeted by this assay, and inadequate number of viral copies(<138 copies/mL). A negative result must be combined with clinical observations, patient history, and epidemiological information. The expected result is Negative.  Fact Sheet for Patients:  bloggercourse.com  Fact Sheet for Healthcare Providers:  seriousbroker.it  This test is no t yet approved or cleared by the [place]  FDA and  has been authorized for detection and/or diagnosis of SARS-CoV-2 by FDA under an Emergency Use Authorization (EUA). This EUA will remain  in effect (meaning this test can be used) for the duration of the COVID-19 declaration under Section 564(b)(1) of the Act, 21 U.S.C.section 360bbb-3(b)(1), unless the authorization is terminated  or revoked sooner.       Influenza A by PCR NEGATIVE NEGATIVE Final   Influenza B by PCR NEGATIVE NEGATIVE  Final    Comment: (NOTE) The Xpert Xpress SARS-CoV-2/FLU/RSV plus assay is intended as an aid in the diagnosis of influenza from Nasopharyngeal swab specimens and should not be used as a sole basis for treatment. Nasal washings and aspirates are unacceptable for Xpert Xpress SARS-CoV-2/FLU/RSV testing.  Fact Sheet for Patients: bloggercourse.com  Fact Sheet for Healthcare Providers: seriousbroker.it  This test is not yet approved or cleared by the [place]  FDA and has been authorized for detection and/or diagnosis of SARS-CoV-2 by FDA under an Emergency Use Authorization (EUA). This EUA will remain in effect (meaning this test can be used) for the duration of the COVID-19 declaration under Section 564(b)(1) of the Act, 21 U.S.C. section 360bbb-3(b)(1), unless the authorization is terminated or revoked.     Resp Syncytial Virus by PCR NEGATIVE NEGATIVE Final    Comment: (NOTE) Fact Sheet for Patients: bloggercourse.com  Fact Sheet for Healthcare Providers: seriousbroker.it  This test is not yet approved or cleared by the [place]  FDA and has been authorized for detection and/or diagnosis of SARS-CoV-2 by FDA under an Emergency Use Authorization (EUA). This EUA will remain in effect (meaning this test can be used) for the duration of the COVID-19 declaration under Section 564(b)(1) of the Act, 21 U.S.C. section 360bbb-3(b)(1), unless the authorization is terminated or revoked.  Performed at [facility], [address], [place], [place] [address]     Time coordinating discharge: Over 30 minutes  Marien LITTIE Piety, MD  [facility] 04/08/2024, 12:23 PM  "

## 2024-04-08 NOTE — Discharge Instructions (Addendum)
 Please make an appointment with your PCP in 1-2 weeks for hospital follow up.  Continue to take your antibiotic until it is completed, starting with this evening before bed.   To help prevent aspirating on foods, please continue to follow the dysphagia 3 diet plan and take small bites, with assistance, and be sitting upright while eating.  You may choose to take a probiotic to help recover healthy gut bacteria after course of antibiotics. This can be accomplished with diet or supplements. Please see the attached information.   ----------------------------------------------   Do you feel isolated?  [facility] offers a [employer] that anyone can call toll free at [phone]. The friendship line is available 24 hours a day  [facility] is a Program of All-inclusive Care for the Elderly (PACE). Their mission is to promote and sustain the independence of seniors wishing to remain in the community. They provide seniors with comprehensive long-term health, social, medical and dietary care. Their program is a safe alternative to nursing home care. [phone]  [facility] Physical Address [facility] [address] [place], [place] [address] Phone: [phone]. . Online zoom yoga class, connect with others without leaving your home [facility] offers [place] dance cardio sessions for individuals via Zoom. This program provides: - Dance fitness activities Please contact program for more information. Servinganyone in need adults 18+ hiv/aids individuals families Call [phone]  Email [email or web address] to get more info  [employer] offers an online [employer] to individuals where they can receive help to focus on their best health. Whether you're a [employer] member or not, the neighborhood center offers a... Main Serviceshealth education  exercise & fitness  community support services  recreation  virtual support Other  Servicessupport groups Servinganyone in need adults young adults teens seniors individuals families [email or web address] to get more info  Schedule on their website  [facility] offers an array of activities for adults age 61 and over. This program provides:- Fitness and health programs- Tech classes- Activity books Main Serviceshealth education  community support services  exercise & fitness  recreation  more education Servingseniors  Call [phone]    For more resources go online to [email or web address] and type in you zipcode

## 2024-04-08 NOTE — Progress Notes (Incomplete)
 " PROGRESS NOTE Derek Andersen  FMW:[id number] DOB: 01/12/1941 DOA: 04/01/2024 PCP: Diedra Lame, MD  Chief Complaint  Patient presents with   Respiratory Distress   Hospital Course:  Derek Andersen is a 84 y.o. 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 Patient was on BiPAP and severe respiratory distress on presentation to ER.  Was placed on BiPAP on presentation, improved clinically on 3 L [place] which is baseline.  Continuing IV antibiotics, refused to CT. Had worsening symptoms on 1/27 and placed on bipap with Abx broadening.   Subjective: Derek Andersen reports feeling improved today. Still says he's felt better. Denies SOB at rest.   Objective: Vitals:   04/07/24 2051 04/07/24 2053 04/08/24 0111 04/08/24 0357  BP: (!) 184/90 (!) 170/69 (!) 129/52 (!) 109/52  Pulse: 78 77 (!) 50 (!) 47  Resp:  (!) 24 16 20   Temp: 98 F (36.7 C) 98 F (36.7 C) 99.1 F (37.3 C) 98.1 F (36.7 C)  TempSrc: Oral Oral    SpO2: 100% 97% 99% 99%  Weight:      Height:        Intake/Output Summary (Last 24 hours) at 04/08/2024 0740 Last data filed at 04/08/2024 0432 Gross per 24 hour  Intake 620 ml  Output 2500 ml  Net -1880 ml   Filed Weights   04/01/24 1412  Weight: 98.7 kg    Examination: Neck: normal, supple, no masses, no thyromegaly Respiratory: Bibasilar crackles, rhonchi present.  No wheezing, no accessory muscle use.  Cardiovascular: Regular rate and rhythm, no murmurs / rubs / gallops. Musculoskeletal: no clubbing / cyanosis. No joint deformity upper and lower extremities. Good ROM, no contractures. Normal muscle tone Skin: no rashes, lesions, ulcers. No induration Neurologic: CN 2-12 grossly intact. Sensation intact, DTR normal. Strength 5/5 in all  4.  Psychiatric: Normal judgment and insight. Alert and oriented x 3. Normal mood  Assessment & Plan:  Acute on chronic hypoxic and hypercapnic respiratory failure Aspiration pneumonia, recurrent Was on BiPAP on presentation, weaned down to 3 L [place] Chronic hypercapnia Suspect patient had episode of choking due to aspiration.  Refused CT due to having episode of unresponsiveness/PEA at [facility], states cannot lay flat COVID/flu/RSV negative CXR shows RUL/apical airspace process stable, may represent focal infection, chronic left base opacification continue IV Zosyn  + doxycycline  Seen by SLP, aspiration and reflux precautions provided hypertonic saline nebs, Tussionex Incentive spirometry, flutter valve, Chest Derek Andersen Wean off BiPAP as able, NPO   Chronic HFrEF BNP elevated, mild hypervolemia Elevated troponin likely due to demand ischemia  One dose IV lasix  20mg  Home Lasix , spironolactone   CKD stage 3a Monitor Cr   PAF continue Eliquis  and Coreg    IDDM- HbA1c 7.0 - continue sliding scale - stopping long-acting for fear of hypoglycemic events and patient is better than goal A1c for age.   HTN Home Benazepril  10 mg, Coreg   Normocytic anemia Monitor Hb  Chronic back pain Continue gabapentin    Anxiety/depression Continue Lexapro , Wellbutrin , as needed Xanax   Stage II sacral wound (POA) WOC consult  OSA CPAP at night  Obesity class II Body mass index is 35.12 kg/m. Outpatient follow up for lifestyle modification and risk factor management  --Bedbound at baseline Will resume home services at discharge Derek Andersen/OT eval  DVT  prophylaxis: Eliquis    Code Status: Limited: Do not attempt resuscitation (DNR) -DNR-LIMITED -Do Not Intubate/DNI   Disposition:  Home  Consultants:  None  Procedures:  None  Antimicrobials:  Anti-infectives (From admission, onward)    Start     Dose/Rate Route Frequency Ordered Stop   04/06/24 2200  piperacillin -tazobactam (ZOSYN ) IVPB 3.375 g   Status:  Discontinued        3.375 g 12.5 mL/hr over 240 Minutes Intravenous Every 8 hours 04/06/24 1837 04/06/24 1932   04/06/24 2200  vancomycin  (VANCOREADY) IVPB 1500 mg/300 mL  Status:  Discontinued        1,500 mg 150 mL/hr over 120 Minutes Intravenous Every 24 hours 04/06/24 1854 04/06/24 1932   04/06/24 2200  piperacillin -tazobactam (ZOSYN ) IVPB 3.375 g        3.375 g 12.5 mL/hr over 240 Minutes Intravenous Every 8 hours 04/06/24 1932     04/06/24 1930  vancomycin  (VANCOREADY) IVPB 2000 mg/400 mL  Status:  Discontinued        2,000 mg 200 mL/hr over 120 Minutes Intravenous  Once 04/06/24 1843 04/06/24 1932   04/05/24 1200  amoxicillin -clavulanate (AUGMENTIN ) 875-125 MG per tablet 1 tablet  Status:  Discontinued        1 tablet Oral Every 12 hours 04/05/24 1108 04/05/24 1141   04/05/24 1200  Ampicillin -Sulbactam (UNASYN ) 3 g in sodium chloride  0.9 % 100 mL IVPB  Status:  Discontinued        3 g 200 mL/hr over 30 Minutes Intravenous Every 6 hours 04/05/24 1141 04/06/24 1811   04/04/24 1330  doxycycline  (VIBRA -TABS) tablet 100 mg        100 mg Oral Every 12 hours 04/04/24 1235 04/09/24 0959   04/01/24 1830  Ampicillin -Sulbactam (UNASYN ) 3 g in sodium chloride  0.9 % 100 mL IVPB  Status:  Discontinued        3 g 200 mL/hr over 30 Minutes Intravenous Every 6 hours 04/01/24 1800 04/05/24 1108       Data Reviewed: I have personally reviewed following labs and imaging studies CBC: Recent Labs  Lab 04/02/24 0415 04/02/24 0954 04/03/24 0506 04/05/24 1156 04/07/24 0535  WBC 8.1 8.5 7.2 8.2 7.8  HGB 8.7* 8.6* 8.9* 8.9* 8.9*  HCT 28.3* 28.1* 29.0* 30.3* 29.8*  MCV 95.0 95.6 94.2 97.1 96.8  PLT 303 295 300 252 247   Basic Metabolic Panel: Recent Labs  Lab 04/03/24 0506 04/05/24 1156 04/06/24 1524 04/06/24 2303 04/07/24 0535  NA 138 138 135 137 137  K 4.0 4.4 4.5 4.5 4.4  CL 99 99 96* 97* 98  CO2 32 33* 32 33* 31  GLUCOSE 146* 225* 273* 112* 103*  BUN 26* 28* 24* 25* 25*   CREATININE 1.18 1.36* 1.15 1.20 1.22  CALCIUM 9.2 9.1 8.6* 8.5* 8.7*    LOS: 7 days  MDM: Patient is high risk for one or more organ failure.  They necessitate ongoing hospitalization for continued IV therapies and subsequent lab monitoring. Total time spent interpreting labs and vitals, reviewing the medical record, coordinating care amongst consultants and care team members, directly assessing and discussing care with the patient and/or family: 55 min Marien LITTIE Piety, MD [facility]  To contact the attending physician between 7A-7P please use Epic Chat. To contact the covering physician during after hours 7P-7A, please review Amion.  04/08/2024, 7:40 AM   "

## 2024-04-08 NOTE — TOC Progression Note (Addendum)
 Transition of Care [facility]) - Progression Note    Patient Details  Name: Derek Andersen MRN: [id number] Date of Birth: June 24, 1940  [facility] CM/SW Contact  Lauraine JAYSON Carpen, LCSW Phone Number: 04/08/2024, 11:19 AM  Clinical Narrative:   [facility] is taking patients home depending on the amount of ice on their roads and driveways. CSW called son. He said there are a lot of spots that are still on scraped on patient's road and his driveway is a solid sheet of ice. Son stated he did not feel comfortable even using 4-wheel drive on the driveway. Son does not feel that patient is medically stable for discharge and is concerned he will have to be readmitted. With the upcoming snow this weekend, he is worried that the ambulance services would not be able to get to him. CSW asked MD to follow up with him.  12:31 pm: [facility] can try to get him home but if it is too dangerous, they will bring him back. MD is aware.  2:06 pm: CSW met with patient and caregiver, Andrea. They would like to appeal the discharge and discharge in the morning once they have had time to prep the home for his arrival. Andrea also wants him to have one more treatment with the bipap to make sure his CO2 level is stable. Gave phone number to [facility] to call and start the process. Told her she may need patient on speaker phone to start. They both expressed understanding. MD confirmed patient will not need bipap at discharge.  Expected Discharge Plan: Home w Home Health Services Barriers to Discharge: Continued Medical Work up               Expected Discharge Plan and Services       Living arrangements for the past 2 months: Single Family Home                           HH Arranged: RN, PT, OT [facility] Agency: [facility] Date [facility] Agency Contacted: 04/02/24   Representative spoke with at [facility] Agency: [place]   Social Drivers of Health (SDOH) Interventions SDOH Screenings    Food Insecurity: No Food Insecurity (04/02/2024)  Housing: Low Risk (04/02/2024)  Transportation Needs: No Transportation Needs (04/02/2024)  Utilities: Not At Risk (04/02/2024)  Financial Resource Strain: Low Risk  (07/14/2023)   [facility] from [facility]  Social Connections: Socially Isolated (04/02/2024)  Stress: Stress Concern Present (08/13/2023)   Received from [facility]  Tobacco Use: Low Risk (04/01/2024)    Readmission Risk Interventions    04/02/2024    3:04 PM  Readmission Risk Prevention Plan  PCP or Specialist Appt within 3-5 Days Complete  HRI or Home Care Consult Complete  Social Work Consult for Recovery Care Planning/Counseling Complete  Palliative Care Screening Not Applicable

## 2024-04-09 ENCOUNTER — Other Ambulatory Visit: Payer: Self-pay

## 2024-04-09 DIAGNOSIS — R4182 Altered mental status, unspecified: Secondary | ICD-10-CM | POA: Diagnosis not present

## 2024-04-09 DIAGNOSIS — J9621 Acute and chronic respiratory failure with hypoxia: Secondary | ICD-10-CM | POA: Diagnosis not present

## 2024-04-09 DIAGNOSIS — J9622 Acute and chronic respiratory failure with hypercapnia: Secondary | ICD-10-CM | POA: Diagnosis not present

## 2024-04-09 LAB — GLUCOSE, CAPILLARY
Glucose-Capillary: 147 mg/dL — ABNORMAL HIGH (ref 70–99)
Glucose-Capillary: 291 mg/dL — ABNORMAL HIGH (ref 70–99)

## 2024-04-09 NOTE — Inpatient Diabetes Management (Addendum)
 Inpatient Diabetes Program Recommendations  AACE/ADA: New Consensus Statement on Inpatient Glycemic Control   Target Ranges:  Prepandial:   less than 140 mg/dL      Peak postprandial:   less than 180 mg/dL (1-2 hours)      Critically ill patients:  140 - 180 mg/dL    Latest Reference Range & Units 04/08/24 03:59 04/08/24 07:48 04/08/24 11:33 04/08/24 16:36 04/08/24 20:14  Glucose-Capillary 70 - 99 mg/dL 814 (H) 860 (H) 768 (H) 351 (H) 210 (H)   Review of Glycemic Control  Diabetes history: DM2 Outpatient Diabetes medications: Basaglar  30 every day, Novolog  0-50 TID, Jardiance 25 every day (not taking)  Current orders for Inpatient glycemic control: Novolog  0-15 units TID with meals  Inpatient Diabetes Program Recommendations:    Insulin : Please consider ordering Novolog  4 units TID with meals for meal coverage if patient eats at least 50% of meals.   Thanks, Earnie Gainer, RN, MSN, CDCES Diabetes Coordinator Inpatient Diabetes Program [phone] (Team Pager from 8am to 5pm)

## 2024-04-09 NOTE — Discharge Summary (Signed)
 " Physician Discharge Summary  Patient: Derek Andersen FMW:[id number] DOB: 06/19/1940   Code Status: Limited: Do not attempt resuscitation (DNR) -DNR-LIMITED -Do Not Intubate/DNI  Admit date: 04/01/2024 Discharge date: 04/09/2024 Disposition: Home, PT, OT, and nurse aid PCP: Diedra Lame, MD  Recommendations for Outpatient Follow-up:  Follow up with PCP within 1-2 weeks Regarding general hospital follow up and preventative care Recommend CBC, BMP  Discharge Diagnoses:  Principal Problem:   PNA (pneumonia) Active Problems:   Aspiration pneumonia (HCC)   Altered mental status   Acute on chronic respiratory failure with hypoxia and hypercapnia [facility])  Brief Hospital Course Summary: Derek Andersen is a 84 y.o. male with medical history significant of DISH with multiple cervical spine surgery, PAF on Eliquis , CAD status post CABG, CKD stage II, chronic HFrEF, recent cardiac arrest on New Year's Day, chronic hypoxic respitory failure on 3 L starting 2 weeks ago, presented with aspiration event.  On presentation to the ED, Patient was on BiPAP and severe respiratory distress. He refused chest CT scan. Treated empirically with IV Abx.  He progressively improved with the exception of mild worsening on 1/27 when his antibiotic course was broadened and he again improved.  SLP evaluated and recommended dysphagia 3 diet. He is a continued risk of aspiration.  He was weaned to room air. VBG was normal.  PT recommended SNF but patient and family declined and instead want to go home with home health.  He is discharged home today in his baseline health status. Continuing augmentin  to complete 10 day antibiotic course after completing 7 days IV course.   His discharge was delayed due to family concern of road conditions yesterday but will be willing to go home today. He has no clinical change and says he feels, pretty good today. Family requested cdiff testing today but he has zero clinical  indication for this and does not meet hospital criteria to test. Will defer.  Recommended probiotics ppx and close PCP follow up.   All other chronic conditions were treated with home medications.    Discharge Condition: Good, improved Recommended discharge diet: dysphagia 3 with assistance and stay  in upright position  Consultations: None   Procedures/Studies: None  Discharge Instructions     Discharge patient   Complete by: As directed    Discharge disposition: 06-Home-Health Care Svc   Discharge patient date: 04/09/2024      Allergies as of 04/09/2024       Reactions   Oxycodone     Patient states you don't know where you are at, and it screws you up. It also gives crazy dreams.   Prednisone Other (See Comments)   Memory loss   Latex         Medication List     STOP taking these medications    Jardiance 25 MG Tabs tablet Generic drug: empagliflozin       TAKE these medications    acetaminophen  325 MG tablet Commonly known as: TYLENOL  Take 650 mg by mouth every 6 (six) hours as needed for headache.   ALPRAZolam  0.25 MG tablet Commonly known as: XANAX  Take 1 tablet (0.25 mg total) by mouth 2 (two) times daily as needed for anxiety.   amoxicillin -clavulanate 875-125 MG tablet Commonly known as: AUGMENTIN  Take 1 tablet by mouth 2 (two) times daily for 3 days.   apixaban  5 MG Tabs tablet Commonly known as: ELIQUIS  Take 5 mg by mouth 2 (two) times daily.   aspirin  EC 81 MG tablet Take  81 mg by mouth daily.   Basaglar  KwikPen 100 UNIT/ML Inject 30 Units into the skin daily.   benazepril  10 MG tablet Commonly known as: LOTENSIN  Take 10 mg by mouth daily.   brimonidine  0.2 % ophthalmic solution Commonly known as: ALPHAGAN  Place 1 drop into both eyes 3 (three) times daily.   buPROPion  300 MG 24 hr tablet Commonly known as: WELLBUTRIN  XL Take 300 mg by mouth daily.   calcium carbonate 750 MG chewable tablet Commonly known as: TUMS EX Chew 750  mg by mouth in the morning and at bedtime.   carvedilol  3.125 MG tablet Commonly known as: COREG  Take 3.125 mg by mouth 2 (two) times daily with a meal.   Cholecalciferol  25 MCG (1000 UT) capsule Take 1 tablet by mouth daily.   cyanocobalamin 1000 MCG tablet Commonly known as: VITAMIN B12 Take 1,000 mcg by mouth daily.   diclofenac  Sodium 1 % Gel Commonly known as: VOLTAREN  Apply 2 g topically in the morning and at bedtime.   escitalopram  20 MG tablet Commonly known as: LEXAPRO  Take 1 tablet (20 mg total) by mouth daily.   ferrous sulfate  325 (65 FE) MG tablet Take 325 mg by mouth daily.   finasteride  5 MG tablet Commonly known as: PROSCAR  Take 5 mg by mouth daily.   Fish Oil 1200 MG Caps Take 2,400 mg by mouth in the morning and at bedtime.   fluticasone  50 MCG/ACT nasal spray Commonly known as: FLONASE  Place 2 sprays into both nostrils daily.   furosemide  20 MG tablet Commonly known as: LASIX  Take 20 mg by mouth daily.   gabapentin  300 MG capsule Commonly known as: NEURONTIN  One tab po qam and 2 tabs po in evening   HYDROcodone -acetaminophen  5-325 MG tablet Commonly known as: NORCO/VICODIN Take 1 tablet by mouth every 6 (six) hours as needed for severe pain (pain score 7-10).   hydrocortisone  2.5 % cream Apply 1 Application topically 2 (two) times daily as needed (facial rash).   insulin  aspart 100 UNIT/ML injection Commonly known as: novoLOG  Inject 0-50 Units into the skin 3 (three) times daily before meals.   NovoLOG  FlexPen 100 UNIT/ML FlexPen Generic drug: insulin  aspart PLEASE SEE ATTACHED FOR DETAILED DIRECTIONS   magnesium  oxide 400 MG tablet Commonly known as: MAG-OX Take 400 mg by mouth daily.   multivitamin with minerals Tabs tablet Take 1 tablet by mouth daily.   pantoprazole  40 MG tablet Commonly known as: PROTONIX  Take 40 mg by mouth daily.   polyethylene glycol 17 g packet Commonly known as: MIRALAX  / GLYCOLAX  Take 17 g by mouth  daily as needed.   pravastatin  40 MG tablet Commonly known as: PRAVACHOL  Take 40 mg by mouth daily.   spironolactone  25 MG tablet Commonly known as: ALDACTONE  Take 0.5 tablets (12.5 mg total) by mouth daily.   tamsulosin  0.4 MG Caps capsule Commonly known as: FLOMAX  Take 0.4 mg by mouth daily.        Follow-up Information     Diedra Lame, MD. Schedule an appointment as soon as possible for a visit in 1 week(s).   Specialty: Family Medicine Contact information: [address] [facility] [place] [address] [phone]                Subjective   Pt reports feeling pretty good today. He has no complaints. He states he feels ready to go home. Denies SOB or abdominal pain.   All questions and concerns were  addressed at time of discharge.  Objective  Blood pressure (!) 139/56, pulse (!) 58, temperature 98.4 F (36.9 C), resp. rate 19, height 5' 6 (1.676 m), weight 98.7 kg, SpO2 95%.   General: Pt is alert, awake, not in acute distress Cardiovascular: RRR, S1/S2 +, no rubs, no gallops Respiratory: CTA bilaterally, no wheezing, no rhonchi Abdominal: Soft, NT, ND, bowel sounds + Extremities: no edema, no cyanosis  The results of significant diagnostics from this hospitalization (including imaging, microbiology, ancillary and laboratory) are listed below for reference.   Imaging studies: DG Chest 1 View Result Date: 04/06/2024 EXAM: 1 VIEW(S) XRAY OF THE CHEST 04/06/2024 02:06:00 PM COMPARISON: 04/01/2024 CLINICAL HISTORY: Pneumonia. FINDINGS: LUNGS AND PLEURA: Low lung volumes. Stable right suprahilar infiltrate. Persistent left retrocardiac opacity due to a combination of elevation of the left hemidiaphragm and superimposed left basilar atelectasis or infiltrate. Infiltrate at the left lung base appears progressive since prior examination. Together, the findings may reflect changes of superimposed multifocal pneumonia.  Insufflation has remained stable. No pleural effusion. No pneumothorax. HEART AND MEDIASTINUM: No acute abnormality of the cardiac and mediastinal silhouettes. BONES AND SOFT TISSUES: Cervical through thoracolumbar spinal fusion hardware. IMPRESSION: 1. Progressive left basilar infiltrate with stable right suprahilar infiltrate, consistent with multifocal pneumonia. 2. Low lung volumes with persistent elevation of the left hemidiaphragm. Electronically signed by: Dorethia Molt MD 04/06/2024 08:56 PM EST RP Workstation: [id number]   DG Chest Portable 1 View Result Date: 04/01/2024 CLINICAL DATA:  Shortness of breath. EXAM: PORTABLE CHEST 1 VIEW COMPARISON:  03/24/2024, 06/26/2023 FINDINGS: Lungs are hypoinflated with persistent left base/retrocardiac opacification possible slight interval improvement as these findings were also present April 2025. Persistent airspace opacification over the right upper lobe/apex new compared April 2025. Cardiomediastinal silhouette and remainder of the exam is unchanged. IMPRESSION: Right upper lobe/apical airspace process which is stable in may represent focal infection. Chronic left base opacification. Electronically Signed   By: Toribio Agreste M.D.   On: 04/01/2024 14:49   DG Chest 1 View Result Date: 03/24/2024 EXAM: 1 VIEW(S) XRAY OF THE CHEST 03/24/2024 11:11:00 PM COMPARISON: 06/26/2023 CLINICAL HISTORY: SOB (shortness of breath) FINDINGS: LUNGS AND PLEURA: Low lung volumes. Left basilar airspace opacity. Right upper lobe airspace opacity. Small left pleural effusion. No pneumothorax. HEART AND MEDIASTINUM: No acute abnormality of the cardiac and mediastinal silhouettes. BONES AND SOFT TISSUES: Thoracic surgical hardware noted. No acute osseous abnormality. IMPRESSION: 1. Left basilar and right upper lobe airspace opacities. 2. Small left pleural effusion. 3. Low lung volumes. Electronically signed by: Kate Plummer MD 03/24/2024 11:15 PM EST RP Workstation: [id number]     Labs: Basic Metabolic Panel: Recent Labs  Lab 04/05/24 1156 04/06/24 1524 04/06/24 2303 04/07/24 0535 04/08/24 0839  NA 138 135 137 137 139  K 4.4 4.5 4.5 4.4 4.7  CL 99 96* 97* 98 99  CO2 33* 32 33* 31 35*  GLUCOSE 225* 273* 112* 103* 138*  BUN 28* 24* 25* 25* 25*  CREATININE 1.36* 1.15 1.20 1.22 1.29*  CALCIUM 9.1 8.6* 8.5* 8.7* 8.9   CBC: Recent Labs  Lab 04/02/24 0954 04/03/24 0506 04/05/24 1156 04/07/24 0535 04/08/24 0839  WBC 8.5 7.2 8.2 7.8 7.7  HGB 8.6* 8.9* 8.9* 8.9* 9.0*  HCT 28.1* 29.0* 30.3* 29.8* 29.5*  MCV 95.6 94.2 97.1 96.8 94.9  PLT 295 300 252 247 211   Microbiology: Results for orders placed or performed during the hospital encounter of 04/01/24  Resp panel by RT-PCR (RSV, Flu A&B, Covid) Anterior  Nasal Swab     Status: None   Collection Time: 04/01/24  4:39 PM   Specimen: Anterior Nasal Swab  Result Value Ref Range Status   SARS Coronavirus 2 by RT PCR NEGATIVE NEGATIVE Final    Comment: (NOTE) SARS-CoV-2 target nucleic acids are NOT DETECTED.  The SARS-CoV-2 RNA is generally detectable in upper respiratory specimens during the acute phase of infection. The lowest concentration of SARS-CoV-2 viral copies this assay can detect is 138 copies/mL. A negative result does not preclude SARS-Cov-2 infection and should not be used as the sole basis for treatment or other patient management decisions. A negative result may occur with  improper specimen collection/handling, submission of specimen other than nasopharyngeal swab, presence of viral mutation(s) within the areas targeted by this assay, and inadequate number of viral copies(<138 copies/mL). A negative result must be combined with clinical observations, patient history, and epidemiological information. The expected result is Negative.  Fact Sheet for Patients:  bloggercourse.com  Fact Sheet for Healthcare Providers:   seriousbroker.it  This test is no t yet approved or cleared by the [place]  FDA and  has been authorized for detection and/or diagnosis of SARS-CoV-2 by FDA under an Emergency Use Authorization (EUA). This EUA will remain  in effect (meaning this test can be used) for the duration of the COVID-19 declaration under Section 564(b)(1) of the Act, 21 U.S.C.section 360bbb-3(b)(1), unless the authorization is terminated  or revoked sooner.       Influenza A by PCR NEGATIVE NEGATIVE Final   Influenza B by PCR NEGATIVE NEGATIVE Final    Comment: (NOTE) The Xpert Xpress SARS-CoV-2/FLU/RSV plus assay is intended as an aid in the diagnosis of influenza from Nasopharyngeal swab specimens and should not be used as a sole basis for treatment. Nasal washings and aspirates are unacceptable for Xpert Xpress SARS-CoV-2/FLU/RSV testing.  Fact Sheet for Patients: bloggercourse.com  Fact Sheet for Healthcare Providers: seriousbroker.it  This test is not yet approved or cleared by the [place]  FDA and has been authorized for detection and/or diagnosis of SARS-CoV-2 by FDA under an Emergency Use Authorization (EUA). This EUA will remain in effect (meaning this test can be used) for the duration of the COVID-19 declaration under Section 564(b)(1) of the Act, 21 U.S.C. section 360bbb-3(b)(1), unless the authorization is terminated or revoked.     Resp Syncytial Virus by PCR NEGATIVE NEGATIVE Final    Comment: (NOTE) Fact Sheet for Patients: bloggercourse.com  Fact Sheet for Healthcare Providers: seriousbroker.it  This test is not yet approved or cleared by the [place]  FDA and has been authorized for detection and/or diagnosis of SARS-CoV-2 by FDA under an Emergency Use Authorization (EUA). This EUA will remain in effect (meaning this test can be used) for  the duration of the COVID-19 declaration under Section 564(b)(1) of the Act, 21 U.S.C. section 360bbb-3(b)(1), unless the authorization is terminated or revoked.  Performed at [facility], [address], [place], [place] [address]     Time coordinating discharge: Over 30 minutes  Marien LITTIE Piety, MD  [facility] 04/09/2024, 9:46 AM  "

## 2024-04-12 LAB — BLOOD GAS, VENOUS
Acid-Base Excess: 6 mmol/L — ABNORMAL HIGH (ref 0.0–2.0)
Bicarbonate: 34.9 mmol/L — ABNORMAL HIGH (ref 20.0–28.0)
Patient temperature: 37
pCO2, Ven: 71 mmHg (ref 44–60)
pH, Ven: 7.3 (ref 7.25–7.43)
